# Patient Record
Sex: Male | Born: 1950
Health system: Southern US, Community
[De-identification: ages and names within clinical notes are randomized; demographics above are authoritative.]

## PROBLEM LIST (undated history)

## (undated) ENCOUNTER — Emergency Department (HOSPITAL_COMMUNITY): Admission: EM | Payer: PPO | Source: Home / Self Care

## (undated) DIAGNOSIS — D649 Anemia, unspecified: Secondary | ICD-10-CM

## (undated) DIAGNOSIS — N189 Chronic kidney disease, unspecified: Secondary | ICD-10-CM

## (undated) DIAGNOSIS — E785 Hyperlipidemia, unspecified: Secondary | ICD-10-CM

## (undated) DIAGNOSIS — F0280 Dementia in other diseases classified elsewhere without behavioral disturbance: Secondary | ICD-10-CM

## (undated) DIAGNOSIS — K219 Gastro-esophageal reflux disease without esophagitis: Secondary | ICD-10-CM

## (undated) DIAGNOSIS — I639 Cerebral infarction, unspecified: Secondary | ICD-10-CM

## (undated) DIAGNOSIS — G3109 Other frontotemporal dementia: Secondary | ICD-10-CM

## (undated) DIAGNOSIS — F32A Depression, unspecified: Secondary | ICD-10-CM

## (undated) DIAGNOSIS — F329 Major depressive disorder, single episode, unspecified: Secondary | ICD-10-CM

## (undated) DIAGNOSIS — K635 Polyp of colon: Secondary | ICD-10-CM

## (undated) DIAGNOSIS — I10 Essential (primary) hypertension: Secondary | ICD-10-CM

## (undated) HISTORY — PX: POLYPECTOMY: SHX149

## (undated) HISTORY — DX: Dementia in other diseases classified elsewhere without behavioral disturbance: F02.80

## (undated) HISTORY — DX: Essential (primary) hypertension: I10

## (undated) HISTORY — DX: Depression, unspecified: F32.A

## (undated) HISTORY — DX: Other frontotemporal dementia: G31.09

## (undated) HISTORY — DX: Chronic kidney disease, unspecified: N18.9

## (undated) HISTORY — DX: Polyp of colon: K63.5

## (undated) HISTORY — DX: Hyperlipidemia, unspecified: E78.5

## (undated) HISTORY — DX: Anemia, unspecified: D64.9

## (undated) HISTORY — DX: Cerebral infarction, unspecified: I63.9

## (undated) HISTORY — DX: Major depressive disorder, single episode, unspecified: F32.9

## (undated) HISTORY — PX: COLONOSCOPY: SHX174

---

## 2005-04-27 ENCOUNTER — Inpatient Hospital Stay (HOSPITAL_COMMUNITY): Admission: EM | Admit: 2005-04-27 | Discharge: 2005-05-03 | Payer: Self-pay | Admitting: Pediatrics

## 2005-04-29 ENCOUNTER — Encounter: Payer: Self-pay | Admitting: Neurology

## 2005-05-03 ENCOUNTER — Inpatient Hospital Stay (HOSPITAL_COMMUNITY)
Admission: RE | Admit: 2005-05-03 | Discharge: 2005-05-24 | Payer: Self-pay | Admitting: Physical Medicine & Rehabilitation

## 2005-05-03 ENCOUNTER — Ambulatory Visit: Payer: Self-pay | Admitting: Physical Medicine & Rehabilitation

## 2005-05-27 ENCOUNTER — Ambulatory Visit: Payer: Self-pay | Admitting: Family Medicine

## 2005-05-27 ENCOUNTER — Ambulatory Visit: Payer: Self-pay | Admitting: *Deleted

## 2005-05-28 ENCOUNTER — Encounter
Admission: RE | Admit: 2005-05-28 | Discharge: 2005-07-03 | Payer: Self-pay | Admitting: Physical Medicine & Rehabilitation

## 2005-06-11 ENCOUNTER — Emergency Department (HOSPITAL_COMMUNITY): Admission: EM | Admit: 2005-06-11 | Discharge: 2005-06-11 | Payer: Self-pay | Admitting: Family Medicine

## 2005-06-26 ENCOUNTER — Emergency Department (HOSPITAL_COMMUNITY): Admission: EM | Admit: 2005-06-26 | Discharge: 2005-06-26 | Payer: Self-pay | Admitting: Family Medicine

## 2005-06-30 ENCOUNTER — Inpatient Hospital Stay (HOSPITAL_COMMUNITY): Admission: EM | Admit: 2005-06-30 | Discharge: 2005-07-04 | Payer: Self-pay | Admitting: Emergency Medicine

## 2005-06-30 ENCOUNTER — Ambulatory Visit: Payer: Self-pay | Admitting: Internal Medicine

## 2005-07-01 ENCOUNTER — Encounter (INDEPENDENT_AMBULATORY_CARE_PROVIDER_SITE_OTHER): Payer: Self-pay | Admitting: Interventional Cardiology

## 2005-07-04 ENCOUNTER — Encounter
Admission: RE | Admit: 2005-07-04 | Discharge: 2005-09-25 | Payer: Self-pay | Admitting: Physical Medicine & Rehabilitation

## 2005-07-15 ENCOUNTER — Ambulatory Visit: Payer: Self-pay | Admitting: Family Medicine

## 2005-07-23 ENCOUNTER — Encounter
Admission: RE | Admit: 2005-07-23 | Discharge: 2005-10-21 | Payer: Self-pay | Admitting: Physical Medicine & Rehabilitation

## 2005-07-23 ENCOUNTER — Ambulatory Visit: Payer: Self-pay | Admitting: Physical Medicine & Rehabilitation

## 2005-07-31 ENCOUNTER — Ambulatory Visit: Payer: Self-pay | Admitting: Family Medicine

## 2005-09-02 ENCOUNTER — Ambulatory Visit: Payer: Self-pay | Admitting: Internal Medicine

## 2005-09-18 ENCOUNTER — Emergency Department (HOSPITAL_COMMUNITY): Admission: EM | Admit: 2005-09-18 | Discharge: 2005-09-18 | Payer: Self-pay | Admitting: Emergency Medicine

## 2005-10-06 ENCOUNTER — Inpatient Hospital Stay (HOSPITAL_COMMUNITY): Admission: EM | Admit: 2005-10-06 | Discharge: 2005-10-08 | Payer: Self-pay | Admitting: Family Medicine

## 2005-10-07 ENCOUNTER — Encounter: Payer: Self-pay | Admitting: Vascular Surgery

## 2005-10-07 ENCOUNTER — Encounter (INDEPENDENT_AMBULATORY_CARE_PROVIDER_SITE_OTHER): Payer: Self-pay | Admitting: Interventional Cardiology

## 2005-10-14 ENCOUNTER — Encounter
Admission: RE | Admit: 2005-10-14 | Discharge: 2006-01-12 | Payer: Self-pay | Admitting: Physical Medicine & Rehabilitation

## 2005-10-15 ENCOUNTER — Ambulatory Visit: Payer: Self-pay | Admitting: Physical Medicine & Rehabilitation

## 2005-10-17 ENCOUNTER — Ambulatory Visit: Payer: Self-pay | Admitting: Nurse Practitioner

## 2005-10-23 ENCOUNTER — Ambulatory Visit: Payer: Self-pay | Admitting: Family Medicine

## 2005-11-11 ENCOUNTER — Ambulatory Visit: Payer: Self-pay | Admitting: Physical Medicine & Rehabilitation

## 2005-11-21 ENCOUNTER — Encounter
Admission: RE | Admit: 2005-11-21 | Discharge: 2006-02-19 | Payer: Self-pay | Admitting: Physical Medicine & Rehabilitation

## 2005-12-02 ENCOUNTER — Encounter
Admission: RE | Admit: 2005-12-02 | Discharge: 2006-03-02 | Payer: Self-pay | Admitting: Physical Medicine & Rehabilitation

## 2005-12-02 ENCOUNTER — Encounter (INDEPENDENT_AMBULATORY_CARE_PROVIDER_SITE_OTHER): Payer: Self-pay | Admitting: Internal Medicine

## 2005-12-03 ENCOUNTER — Ambulatory Visit: Payer: Self-pay | Admitting: Internal Medicine

## 2006-01-01 ENCOUNTER — Ambulatory Visit: Payer: Self-pay | Admitting: Physical Medicine & Rehabilitation

## 2006-01-28 ENCOUNTER — Ambulatory Visit: Payer: Self-pay | Admitting: Internal Medicine

## 2006-03-03 ENCOUNTER — Ambulatory Visit: Payer: Self-pay | Admitting: Internal Medicine

## 2006-03-20 ENCOUNTER — Ambulatory Visit: Payer: Self-pay | Admitting: Internal Medicine

## 2006-04-24 ENCOUNTER — Ambulatory Visit: Payer: Self-pay | Admitting: Internal Medicine

## 2006-04-24 ENCOUNTER — Encounter (INDEPENDENT_AMBULATORY_CARE_PROVIDER_SITE_OTHER): Payer: Self-pay | Admitting: Internal Medicine

## 2006-04-24 DIAGNOSIS — I10 Essential (primary) hypertension: Secondary | ICD-10-CM

## 2006-04-24 DIAGNOSIS — E1169 Type 2 diabetes mellitus with other specified complication: Secondary | ICD-10-CM

## 2006-04-24 DIAGNOSIS — E785 Hyperlipidemia, unspecified: Secondary | ICD-10-CM

## 2006-04-24 HISTORY — DX: Hyperlipidemia, unspecified: E78.5

## 2006-04-24 LAB — CONVERTED CEMR LAB
Albumin: 4.5 g/dL (ref 3.5–5.2)
CO2: 22 meq/L (ref 19–32)
Glucose, Bld: 108 mg/dL — ABNORMAL HIGH (ref 70–99)
Potassium: 4.7 meq/L (ref 3.5–5.3)
RBC: 5 M/uL (ref 4.22–5.81)
Sodium: 142 meq/L (ref 135–145)
Total Protein: 7.9 g/dL (ref 6.0–8.3)
WBC: 5.7 10*3/uL (ref 4.0–10.5)

## 2006-04-25 ENCOUNTER — Encounter (INDEPENDENT_AMBULATORY_CARE_PROVIDER_SITE_OTHER): Payer: Self-pay | Admitting: Internal Medicine

## 2006-04-25 DIAGNOSIS — N189 Chronic kidney disease, unspecified: Secondary | ICD-10-CM

## 2006-04-25 DIAGNOSIS — N182 Chronic kidney disease, stage 2 (mild): Secondary | ICD-10-CM

## 2006-04-25 HISTORY — DX: Chronic kidney disease, unspecified: N18.9

## 2006-04-28 ENCOUNTER — Telehealth: Payer: Self-pay | Admitting: *Deleted

## 2006-05-01 ENCOUNTER — Encounter
Admission: RE | Admit: 2006-05-01 | Discharge: 2006-07-30 | Payer: Self-pay | Admitting: Physical Medicine & Rehabilitation

## 2006-05-01 ENCOUNTER — Ambulatory Visit: Payer: Self-pay | Admitting: Physical Medicine & Rehabilitation

## 2006-05-08 ENCOUNTER — Telehealth (INDEPENDENT_AMBULATORY_CARE_PROVIDER_SITE_OTHER): Payer: Self-pay | Admitting: Internal Medicine

## 2006-05-23 ENCOUNTER — Ambulatory Visit: Payer: Self-pay | Admitting: Internal Medicine

## 2006-05-23 ENCOUNTER — Ambulatory Visit: Payer: Self-pay | Admitting: *Deleted

## 2006-05-23 ENCOUNTER — Telehealth: Payer: Self-pay | Admitting: *Deleted

## 2006-05-23 ENCOUNTER — Inpatient Hospital Stay (HOSPITAL_COMMUNITY): Admission: AD | Admit: 2006-05-23 | Discharge: 2006-05-26 | Payer: Self-pay | Admitting: Internal Medicine

## 2006-05-23 LAB — CONVERTED CEMR LAB
Potassium: 4.2 meq/L (ref 3.5–5.3)
Sodium: 142 meq/L (ref 135–145)

## 2006-05-26 ENCOUNTER — Encounter (INDEPENDENT_AMBULATORY_CARE_PROVIDER_SITE_OTHER): Payer: Self-pay | Admitting: Internal Medicine

## 2006-05-27 ENCOUNTER — Telehealth (INDEPENDENT_AMBULATORY_CARE_PROVIDER_SITE_OTHER): Payer: Self-pay | Admitting: *Deleted

## 2006-06-02 ENCOUNTER — Telehealth (INDEPENDENT_AMBULATORY_CARE_PROVIDER_SITE_OTHER): Payer: Self-pay | Admitting: *Deleted

## 2006-06-03 ENCOUNTER — Telehealth: Payer: Self-pay | Admitting: *Deleted

## 2006-06-04 ENCOUNTER — Ambulatory Visit: Payer: Self-pay | Admitting: Hospitalist

## 2006-06-04 LAB — CONVERTED CEMR LAB: Blood Glucose, Fingerstick: 150

## 2006-06-05 ENCOUNTER — Encounter (INDEPENDENT_AMBULATORY_CARE_PROVIDER_SITE_OTHER): Payer: Self-pay | Admitting: Pulmonary Disease

## 2006-06-05 ENCOUNTER — Telehealth: Payer: Self-pay | Admitting: *Deleted

## 2006-06-05 LAB — CONVERTED CEMR LAB
BUN: 27 mg/dL — ABNORMAL HIGH (ref 6–23)
CO2: 23 meq/L (ref 19–32)
Chloride: 106 meq/L (ref 96–112)
Creatinine, Ser: 1.61 mg/dL — ABNORMAL HIGH (ref 0.40–1.50)
Glucose, Bld: 85 mg/dL (ref 70–99)
Sodium: 142 meq/L (ref 135–145)

## 2006-06-10 ENCOUNTER — Ambulatory Visit: Payer: Self-pay | Admitting: Internal Medicine

## 2006-06-10 ENCOUNTER — Encounter (INDEPENDENT_AMBULATORY_CARE_PROVIDER_SITE_OTHER): Payer: Self-pay | Admitting: Pulmonary Disease

## 2006-06-11 LAB — CONVERTED CEMR LAB
Calcium: 9.5 mg/dL (ref 8.4–10.5)
Glucose, Bld: 99 mg/dL (ref 70–99)
Potassium: 4.5 meq/L (ref 3.5–5.3)
Sodium: 144 meq/L (ref 135–145)

## 2006-06-12 ENCOUNTER — Telehealth (INDEPENDENT_AMBULATORY_CARE_PROVIDER_SITE_OTHER): Payer: Self-pay | Admitting: *Deleted

## 2006-06-16 ENCOUNTER — Ambulatory Visit: Payer: Self-pay | Admitting: Internal Medicine

## 2006-06-16 ENCOUNTER — Encounter (INDEPENDENT_AMBULATORY_CARE_PROVIDER_SITE_OTHER): Payer: Self-pay | Admitting: Dermatology

## 2006-06-16 LAB — CONVERTED CEMR LAB
BUN: 25 mg/dL — ABNORMAL HIGH (ref 6–23)
Blood Glucose, Fingerstick: 104
Creatinine, Ser: 1.5 mg/dL (ref 0.40–1.50)
Hgb A1c MFr Bld: 5.9 %
Potassium: 4 meq/L (ref 3.5–5.3)

## 2006-06-17 ENCOUNTER — Encounter (INDEPENDENT_AMBULATORY_CARE_PROVIDER_SITE_OTHER): Payer: Self-pay | Admitting: *Deleted

## 2006-07-10 ENCOUNTER — Telehealth: Payer: Self-pay | Admitting: *Deleted

## 2006-07-22 ENCOUNTER — Telehealth: Payer: Self-pay | Admitting: *Deleted

## 2006-08-04 ENCOUNTER — Ambulatory Visit: Payer: Self-pay | Admitting: Internal Medicine

## 2006-08-04 ENCOUNTER — Telehealth: Payer: Self-pay | Admitting: *Deleted

## 2006-08-05 ENCOUNTER — Telehealth: Payer: Self-pay | Admitting: *Deleted

## 2006-08-06 ENCOUNTER — Telehealth: Payer: Self-pay | Admitting: *Deleted

## 2006-08-10 ENCOUNTER — Emergency Department (HOSPITAL_COMMUNITY): Admission: EM | Admit: 2006-08-10 | Discharge: 2006-08-10 | Payer: Self-pay | Admitting: Family Medicine

## 2006-08-11 ENCOUNTER — Telehealth (INDEPENDENT_AMBULATORY_CARE_PROVIDER_SITE_OTHER): Payer: Self-pay | Admitting: *Deleted

## 2006-08-21 ENCOUNTER — Ambulatory Visit: Payer: Self-pay | Admitting: Internal Medicine

## 2006-08-21 ENCOUNTER — Ambulatory Visit (HOSPITAL_COMMUNITY): Admission: RE | Admit: 2006-08-21 | Discharge: 2006-08-21 | Payer: Self-pay | Admitting: Internal Medicine

## 2006-08-21 LAB — CONVERTED CEMR LAB: Blood Glucose, Fingerstick: 188

## 2006-08-22 ENCOUNTER — Telehealth: Payer: Self-pay | Admitting: *Deleted

## 2006-08-22 ENCOUNTER — Ambulatory Visit: Payer: Self-pay | Admitting: Physical Medicine & Rehabilitation

## 2006-08-22 ENCOUNTER — Encounter
Admission: RE | Admit: 2006-08-22 | Discharge: 2006-10-14 | Payer: Self-pay | Admitting: Physical Medicine & Rehabilitation

## 2006-09-18 ENCOUNTER — Ambulatory Visit: Payer: Self-pay | Admitting: Hospitalist

## 2006-09-18 ENCOUNTER — Encounter (INDEPENDENT_AMBULATORY_CARE_PROVIDER_SITE_OTHER): Payer: Self-pay | Admitting: Internal Medicine

## 2006-09-18 LAB — CONVERTED CEMR LAB
Blood Glucose, Fingerstick: 114
Hgb A1c MFr Bld: 6.3 %

## 2006-09-19 LAB — CONVERTED CEMR LAB
ALT: 22 units/L (ref 0–53)
AST: 20 units/L (ref 0–37)
Alkaline Phosphatase: 63 units/L (ref 39–117)
BUN: 23 mg/dL (ref 6–23)
Creatinine, Ser: 1.71 mg/dL — ABNORMAL HIGH (ref 0.40–1.50)
Potassium: 4.2 meq/L (ref 3.5–5.3)

## 2006-09-24 ENCOUNTER — Telehealth: Payer: Self-pay | Admitting: *Deleted

## 2006-10-01 ENCOUNTER — Encounter (INDEPENDENT_AMBULATORY_CARE_PROVIDER_SITE_OTHER): Payer: Self-pay | Admitting: *Deleted

## 2006-10-01 ENCOUNTER — Telehealth: Payer: Self-pay | Admitting: Internal Medicine

## 2006-10-02 ENCOUNTER — Telehealth: Payer: Self-pay | Admitting: Internal Medicine

## 2006-10-08 ENCOUNTER — Telehealth: Payer: Self-pay | Admitting: Internal Medicine

## 2006-10-09 ENCOUNTER — Emergency Department (HOSPITAL_COMMUNITY): Admission: EM | Admit: 2006-10-09 | Discharge: 2006-10-09 | Payer: Self-pay | Admitting: Emergency Medicine

## 2006-10-20 ENCOUNTER — Telehealth: Payer: Self-pay | Admitting: *Deleted

## 2006-11-12 ENCOUNTER — Telehealth (INDEPENDENT_AMBULATORY_CARE_PROVIDER_SITE_OTHER): Payer: Self-pay | Admitting: Pharmacy Technician

## 2006-11-13 ENCOUNTER — Telehealth (INDEPENDENT_AMBULATORY_CARE_PROVIDER_SITE_OTHER): Payer: Self-pay | Admitting: *Deleted

## 2006-11-14 ENCOUNTER — Telehealth (INDEPENDENT_AMBULATORY_CARE_PROVIDER_SITE_OTHER): Payer: Self-pay | Admitting: *Deleted

## 2006-11-19 ENCOUNTER — Telehealth (INDEPENDENT_AMBULATORY_CARE_PROVIDER_SITE_OTHER): Payer: Self-pay | Admitting: Pharmacy Technician

## 2006-12-04 ENCOUNTER — Ambulatory Visit: Payer: Self-pay | Admitting: Internal Medicine

## 2006-12-04 ENCOUNTER — Encounter (INDEPENDENT_AMBULATORY_CARE_PROVIDER_SITE_OTHER): Payer: Self-pay | Admitting: *Deleted

## 2006-12-04 LAB — CONVERTED CEMR LAB
CO2: 30 meq/L (ref 19–32)
Glucose, Bld: 85 mg/dL (ref 70–99)
Potassium: 4.3 meq/L (ref 3.5–5.3)
Sodium: 144 meq/L (ref 135–145)

## 2006-12-17 ENCOUNTER — Ambulatory Visit: Payer: Self-pay | Admitting: Infectious Diseases

## 2006-12-17 LAB — CONVERTED CEMR LAB: Blood Glucose, Fingerstick: 98

## 2006-12-26 ENCOUNTER — Telehealth (INDEPENDENT_AMBULATORY_CARE_PROVIDER_SITE_OTHER): Payer: Self-pay | Admitting: *Deleted

## 2007-01-06 ENCOUNTER — Telehealth: Payer: Self-pay | Admitting: *Deleted

## 2007-01-26 ENCOUNTER — Encounter (INDEPENDENT_AMBULATORY_CARE_PROVIDER_SITE_OTHER): Payer: Self-pay | Admitting: Internal Medicine

## 2007-01-26 ENCOUNTER — Ambulatory Visit: Payer: Self-pay | Admitting: Infectious Disease

## 2007-01-26 DIAGNOSIS — F329 Major depressive disorder, single episode, unspecified: Secondary | ICD-10-CM

## 2007-01-26 LAB — CONVERTED CEMR LAB: Blood Glucose, Fingerstick: 115

## 2007-02-03 ENCOUNTER — Encounter (INDEPENDENT_AMBULATORY_CARE_PROVIDER_SITE_OTHER): Payer: Self-pay | Admitting: Internal Medicine

## 2007-02-03 ENCOUNTER — Ambulatory Visit: Payer: Self-pay | Admitting: Infectious Disease

## 2007-02-04 ENCOUNTER — Ambulatory Visit: Payer: Self-pay | Admitting: Internal Medicine

## 2007-02-04 ENCOUNTER — Encounter (INDEPENDENT_AMBULATORY_CARE_PROVIDER_SITE_OTHER): Payer: Self-pay | Admitting: Internal Medicine

## 2007-02-04 LAB — CONVERTED CEMR LAB
Basophils Absolute: 0 10*3/uL (ref 0.0–0.1)
CO2: 24 meq/L (ref 19–32)
Chloride: 104 meq/L (ref 96–112)
Creatinine, Ser: 1.59 mg/dL — ABNORMAL HIGH (ref 0.40–1.50)
Glucose, Bld: 98 mg/dL (ref 70–99)
HDL: 36 mg/dL — ABNORMAL LOW (ref >39)
Hemoglobin: 12.2 g/dL — ABNORMAL LOW (ref 13.0–17.0)
LDL Cholesterol: 73 mg/dL (ref 0–99)
Lymphocytes Relative: 47 % — ABNORMAL HIGH (ref 12–46)
Lymphs Abs: 2.4 10*3/uL (ref 0.7–4.0)
Monocytes Absolute: 0.5 10*3/uL (ref 0.1–1.0)
Monocytes Relative: 10 % (ref 3–12)
Neutro Abs: 2.1 10*3/uL (ref 1.7–7.7)
RBC: 4.73 M/uL (ref 4.22–5.81)
RDW: 15.3 % (ref 11.5–15.5)
Testosterone: 239.7 ng/dL — ABNORMAL LOW (ref 350–890)
WBC: 5.2 10*3/uL (ref 4.0–10.5)

## 2007-02-05 ENCOUNTER — Encounter: Payer: Self-pay | Admitting: Licensed Clinical Social Worker

## 2007-02-12 ENCOUNTER — Ambulatory Visit: Payer: Self-pay

## 2007-02-12 DIAGNOSIS — J069 Acute upper respiratory infection, unspecified: Secondary | ICD-10-CM | POA: Insufficient documentation

## 2007-02-16 ENCOUNTER — Encounter: Admission: RE | Admit: 2007-02-16 | Discharge: 2007-03-13 | Payer: Self-pay | Admitting: Internal Medicine

## 2007-02-19 ENCOUNTER — Encounter
Admission: RE | Admit: 2007-02-19 | Discharge: 2007-02-20 | Payer: Self-pay | Admitting: Physical Medicine & Rehabilitation

## 2007-02-19 ENCOUNTER — Encounter (INDEPENDENT_AMBULATORY_CARE_PROVIDER_SITE_OTHER): Payer: Self-pay | Admitting: Internal Medicine

## 2007-02-19 ENCOUNTER — Ambulatory Visit: Payer: Self-pay | Admitting: Physical Medicine & Rehabilitation

## 2007-03-11 ENCOUNTER — Encounter (INDEPENDENT_AMBULATORY_CARE_PROVIDER_SITE_OTHER): Payer: Self-pay | Admitting: Internal Medicine

## 2007-03-11 ENCOUNTER — Ambulatory Visit: Payer: Self-pay | Admitting: Internal Medicine

## 2007-03-12 ENCOUNTER — Encounter (INDEPENDENT_AMBULATORY_CARE_PROVIDER_SITE_OTHER): Payer: Self-pay | Admitting: Internal Medicine

## 2007-03-12 LAB — CONVERTED CEMR LAB
BUN: 17 mg/dL (ref 6–23)
Calcium: 9.8 mg/dL (ref 8.4–10.5)
Glucose, Bld: 92 mg/dL (ref 70–99)
Sodium: 145 meq/L (ref 135–145)

## 2007-04-07 ENCOUNTER — Telehealth: Payer: Self-pay | Admitting: Internal Medicine

## 2007-05-21 ENCOUNTER — Ambulatory Visit (HOSPITAL_COMMUNITY): Admission: RE | Admit: 2007-05-21 | Discharge: 2007-05-21 | Payer: Self-pay | Admitting: Podiatry

## 2007-05-21 ENCOUNTER — Encounter (INDEPENDENT_AMBULATORY_CARE_PROVIDER_SITE_OTHER): Payer: Self-pay | Admitting: Podiatry

## 2007-05-21 ENCOUNTER — Ambulatory Visit: Payer: Self-pay | Admitting: *Deleted

## 2007-06-11 ENCOUNTER — Telehealth (INDEPENDENT_AMBULATORY_CARE_PROVIDER_SITE_OTHER): Payer: Self-pay | Admitting: Internal Medicine

## 2007-06-24 ENCOUNTER — Telehealth (INDEPENDENT_AMBULATORY_CARE_PROVIDER_SITE_OTHER): Payer: Self-pay | Admitting: Internal Medicine

## 2007-08-07 ENCOUNTER — Ambulatory Visit: Payer: Self-pay | Admitting: Internal Medicine

## 2007-08-07 LAB — CONVERTED CEMR LAB: Blood Glucose, Fingerstick: 111

## 2007-08-12 ENCOUNTER — Telehealth (INDEPENDENT_AMBULATORY_CARE_PROVIDER_SITE_OTHER): Payer: Self-pay | Admitting: Internal Medicine

## 2007-08-18 ENCOUNTER — Telehealth: Payer: Self-pay | Admitting: *Deleted

## 2007-08-18 ENCOUNTER — Telehealth: Payer: Self-pay | Admitting: Internal Medicine

## 2007-09-09 ENCOUNTER — Ambulatory Visit: Payer: Self-pay | Admitting: Internal Medicine

## 2007-09-09 DIAGNOSIS — R079 Chest pain, unspecified: Secondary | ICD-10-CM

## 2007-09-10 ENCOUNTER — Telehealth (INDEPENDENT_AMBULATORY_CARE_PROVIDER_SITE_OTHER): Payer: Self-pay | Admitting: Internal Medicine

## 2007-09-24 ENCOUNTER — Telehealth: Payer: Self-pay | Admitting: *Deleted

## 2007-10-01 ENCOUNTER — Telehealth (INDEPENDENT_AMBULATORY_CARE_PROVIDER_SITE_OTHER): Payer: Self-pay | Admitting: Internal Medicine

## 2007-10-02 ENCOUNTER — Telehealth (INDEPENDENT_AMBULATORY_CARE_PROVIDER_SITE_OTHER): Payer: Self-pay | Admitting: Internal Medicine

## 2007-10-05 ENCOUNTER — Telehealth (INDEPENDENT_AMBULATORY_CARE_PROVIDER_SITE_OTHER): Payer: Self-pay | Admitting: Internal Medicine

## 2007-11-05 ENCOUNTER — Telehealth (INDEPENDENT_AMBULATORY_CARE_PROVIDER_SITE_OTHER): Payer: Self-pay | Admitting: Internal Medicine

## 2007-11-10 ENCOUNTER — Emergency Department (HOSPITAL_COMMUNITY): Admission: EM | Admit: 2007-11-10 | Discharge: 2007-11-10 | Payer: Self-pay | Admitting: Family Medicine

## 2007-11-17 ENCOUNTER — Encounter (INDEPENDENT_AMBULATORY_CARE_PROVIDER_SITE_OTHER): Payer: Self-pay | Admitting: Internal Medicine

## 2007-11-17 ENCOUNTER — Ambulatory Visit: Payer: Self-pay | Admitting: Internal Medicine

## 2007-11-17 DIAGNOSIS — D649 Anemia, unspecified: Secondary | ICD-10-CM

## 2007-11-17 HISTORY — DX: Anemia, unspecified: D64.9

## 2007-11-17 LAB — CONVERTED CEMR LAB
AST: 47 units/L — ABNORMAL HIGH (ref 0–37)
Alkaline Phosphatase: 74 units/L (ref 39–117)
BUN: 18 mg/dL (ref 6–23)
Blood Glucose, Fingerstick: 87
Creatinine, Ser: 1.72 mg/dL — ABNORMAL HIGH (ref 0.40–1.50)
HCT: 36.5 % — ABNORMAL LOW (ref 39.0–52.0)
Hemoglobin: 11.7 g/dL — ABNORMAL LOW (ref 13.0–17.0)
MCHC: 32.1 g/dL (ref 30.0–36.0)
RDW: 16.2 % — ABNORMAL HIGH (ref 11.5–15.5)
Total Bilirubin: 0.5 mg/dL (ref 0.3–1.2)

## 2007-11-23 ENCOUNTER — Ambulatory Visit: Payer: Self-pay | Admitting: Gastroenterology

## 2007-12-09 ENCOUNTER — Telehealth (INDEPENDENT_AMBULATORY_CARE_PROVIDER_SITE_OTHER): Payer: Self-pay | Admitting: Internal Medicine

## 2007-12-17 ENCOUNTER — Telehealth: Payer: Self-pay | Admitting: *Deleted

## 2007-12-21 ENCOUNTER — Ambulatory Visit: Payer: Self-pay | Admitting: Gastroenterology

## 2007-12-22 ENCOUNTER — Telehealth: Payer: Self-pay | Admitting: Gastroenterology

## 2008-01-07 ENCOUNTER — Emergency Department (HOSPITAL_COMMUNITY): Admission: EM | Admit: 2008-01-07 | Discharge: 2008-01-07 | Payer: Self-pay | Admitting: Emergency Medicine

## 2008-01-07 ENCOUNTER — Telehealth (INDEPENDENT_AMBULATORY_CARE_PROVIDER_SITE_OTHER): Payer: Self-pay | Admitting: Internal Medicine

## 2008-01-11 ENCOUNTER — Telehealth: Payer: Self-pay | Admitting: Infectious Diseases

## 2008-02-26 ENCOUNTER — Inpatient Hospital Stay (HOSPITAL_COMMUNITY): Admission: EM | Admit: 2008-02-26 | Discharge: 2008-02-29 | Payer: Self-pay | Admitting: Emergency Medicine

## 2008-02-26 ENCOUNTER — Ambulatory Visit: Payer: Self-pay | Admitting: Internal Medicine

## 2008-02-26 ENCOUNTER — Encounter: Payer: Self-pay | Admitting: *Deleted

## 2008-02-26 LAB — CONVERTED CEMR LAB
HDL: 31 mg/dL
LDL Cholesterol: 58 mg/dL

## 2008-02-29 ENCOUNTER — Encounter: Payer: Self-pay | Admitting: Internal Medicine

## 2008-03-02 ENCOUNTER — Encounter: Payer: Self-pay | Admitting: *Deleted

## 2008-03-14 ENCOUNTER — Telehealth: Payer: Self-pay | Admitting: Internal Medicine

## 2008-03-15 ENCOUNTER — Telehealth: Payer: Self-pay | Admitting: Internal Medicine

## 2008-03-22 ENCOUNTER — Telehealth (INDEPENDENT_AMBULATORY_CARE_PROVIDER_SITE_OTHER): Payer: Self-pay | Admitting: Internal Medicine

## 2008-03-25 ENCOUNTER — Encounter (INDEPENDENT_AMBULATORY_CARE_PROVIDER_SITE_OTHER): Payer: Self-pay | Admitting: Internal Medicine

## 2008-03-25 ENCOUNTER — Ambulatory Visit: Payer: Self-pay | Admitting: Internal Medicine

## 2008-03-25 LAB — CONVERTED CEMR LAB
Hemoglobin: 11.4 g/dL — ABNORMAL LOW (ref 13.0–17.0)
RBC: 4.55 M/uL (ref 4.22–5.81)
RDW: 16.6 % — ABNORMAL HIGH (ref 11.5–15.5)

## 2008-04-13 ENCOUNTER — Telehealth (INDEPENDENT_AMBULATORY_CARE_PROVIDER_SITE_OTHER): Payer: Self-pay | Admitting: Internal Medicine

## 2008-08-16 ENCOUNTER — Telehealth: Payer: Self-pay | Admitting: Internal Medicine

## 2008-08-17 ENCOUNTER — Ambulatory Visit: Payer: Self-pay | Admitting: Internal Medicine

## 2008-08-17 LAB — CONVERTED CEMR LAB: Blood Glucose, Fingerstick: 139

## 2008-08-18 ENCOUNTER — Encounter: Payer: Self-pay | Admitting: Internal Medicine

## 2008-08-18 LAB — CONVERTED CEMR LAB
Alkaline Phosphatase: 93 units/L (ref 39–117)
Creatinine, Ser: 1.36 mg/dL (ref 0.40–1.50)
Glucose, Bld: 117 mg/dL — ABNORMAL HIGH (ref 70–99)
Microalb Creat Ratio: 24.3 mg/g (ref 0.0–30.0)
Microalb, Ur: 4.97 mg/dL — ABNORMAL HIGH (ref 0.00–1.89)
Sodium: 145 meq/L (ref 135–145)
Total Bilirubin: 0.2 mg/dL — ABNORMAL LOW (ref 0.3–1.2)
Total Protein: 7 g/dL (ref 6.0–8.3)

## 2008-08-24 ENCOUNTER — Telehealth (INDEPENDENT_AMBULATORY_CARE_PROVIDER_SITE_OTHER): Payer: Self-pay | Admitting: Internal Medicine

## 2008-08-31 ENCOUNTER — Telehealth: Payer: Self-pay | Admitting: Internal Medicine

## 2008-09-01 ENCOUNTER — Telehealth: Payer: Self-pay | Admitting: Internal Medicine

## 2008-09-02 ENCOUNTER — Encounter: Payer: Self-pay | Admitting: Internal Medicine

## 2008-09-05 ENCOUNTER — Telehealth: Payer: Self-pay | Admitting: *Deleted

## 2008-10-26 ENCOUNTER — Telehealth: Payer: Self-pay | Admitting: Internal Medicine

## 2008-10-31 ENCOUNTER — Ambulatory Visit: Payer: Self-pay | Admitting: Internal Medicine

## 2008-11-16 ENCOUNTER — Encounter: Payer: Self-pay | Admitting: Internal Medicine

## 2008-11-17 ENCOUNTER — Encounter (INDEPENDENT_AMBULATORY_CARE_PROVIDER_SITE_OTHER): Payer: Self-pay | Admitting: *Deleted

## 2008-11-17 ENCOUNTER — Ambulatory Visit: Payer: Self-pay | Admitting: Internal Medicine

## 2008-11-17 LAB — CONVERTED CEMR LAB: Blood Glucose, Fingerstick: 134

## 2009-01-05 ENCOUNTER — Telehealth: Payer: Self-pay | Admitting: Internal Medicine

## 2009-01-19 ENCOUNTER — Telehealth: Payer: Self-pay | Admitting: Internal Medicine

## 2009-03-03 ENCOUNTER — Telehealth: Payer: Self-pay | Admitting: *Deleted

## 2009-03-03 ENCOUNTER — Ambulatory Visit: Payer: Self-pay | Admitting: Internal Medicine

## 2009-03-03 DIAGNOSIS — M546 Pain in thoracic spine: Secondary | ICD-10-CM | POA: Insufficient documentation

## 2009-03-03 LAB — CONVERTED CEMR LAB
ALT: 18 units/L (ref 0–53)
AST: 18 units/L (ref 0–37)
Albumin: 4.1 g/dL (ref 3.5–5.2)
Alkaline Phosphatase: 93 units/L (ref 39–117)
BUN: 14 mg/dL (ref 6–23)
Blood Glucose, Fingerstick: 140
CO2: 24 meq/L (ref 19–32)
Calcium: 9.2 mg/dL (ref 8.4–10.5)
Chloride: 107 meq/L (ref 96–112)
Cholesterol: 137 mg/dL (ref 0–200)
Creatinine, Ser: 1.38 mg/dL (ref 0.40–1.50)
Glucose, Bld: 109 mg/dL — ABNORMAL HIGH (ref 70–99)
HDL: 41 mg/dL (ref >39)
Hgb A1c MFr Bld: 7 %
LDL Cholesterol: 66 mg/dL (ref 0–99)
Potassium: 4.5 meq/L (ref 3.5–5.3)
Sodium: 144 meq/L (ref 135–145)
Total Bilirubin: 0.2 mg/dL — ABNORMAL LOW (ref 0.3–1.2)
Total CHOL/HDL Ratio: 3.3
Total Protein: 7 g/dL (ref 6.0–8.3)
Triglycerides: 150 mg/dL — ABNORMAL HIGH (ref <150)
VLDL: 30 mg/dL (ref 0–40)

## 2009-03-16 ENCOUNTER — Telehealth: Payer: Self-pay | Admitting: Internal Medicine

## 2009-05-01 ENCOUNTER — Telehealth: Payer: Self-pay | Admitting: Internal Medicine

## 2009-05-02 ENCOUNTER — Encounter: Payer: Self-pay | Admitting: Internal Medicine

## 2009-05-19 ENCOUNTER — Telehealth: Payer: Self-pay | Admitting: *Deleted

## 2009-05-22 ENCOUNTER — Telehealth: Payer: Self-pay | Admitting: Internal Medicine

## 2009-06-13 ENCOUNTER — Telehealth: Payer: Self-pay | Admitting: Internal Medicine

## 2009-06-29 ENCOUNTER — Telehealth: Payer: Self-pay | Admitting: Internal Medicine

## 2009-08-02 ENCOUNTER — Ambulatory Visit: Payer: Self-pay | Admitting: Internal Medicine

## 2009-08-02 LAB — CONVERTED CEMR LAB
Albumin: 4.3 g/dL (ref 3.5–5.2)
BUN: 15 mg/dL (ref 6–23)
Blood Glucose, AC Bkfst: 100 mg/dL
Calcium: 9.5 mg/dL (ref 8.4–10.5)
Creatinine, Ser: 1.26 mg/dL (ref 0.40–1.50)
Hgb A1c MFr Bld: 6.5 %
Phosphorus: 3.1 mg/dL (ref 2.3–4.6)

## 2009-08-02 LAB — HM DIABETES FOOT EXAM

## 2009-08-10 ENCOUNTER — Telehealth: Payer: Self-pay | Admitting: *Deleted

## 2009-08-18 ENCOUNTER — Telehealth: Payer: Self-pay | Admitting: Internal Medicine

## 2009-09-04 ENCOUNTER — Telehealth: Payer: Self-pay | Admitting: Internal Medicine

## 2009-09-06 ENCOUNTER — Telehealth: Payer: Self-pay | Admitting: Internal Medicine

## 2009-09-23 ENCOUNTER — Telehealth: Payer: Self-pay | Admitting: Internal Medicine

## 2009-09-26 ENCOUNTER — Telehealth: Payer: Self-pay | Admitting: Internal Medicine

## 2009-10-25 ENCOUNTER — Telehealth: Payer: Self-pay | Admitting: Internal Medicine

## 2009-10-27 ENCOUNTER — Telehealth: Payer: Self-pay | Admitting: Internal Medicine

## 2009-11-28 ENCOUNTER — Telehealth (INDEPENDENT_AMBULATORY_CARE_PROVIDER_SITE_OTHER): Payer: Self-pay | Admitting: *Deleted

## 2009-12-04 ENCOUNTER — Ambulatory Visit: Payer: Self-pay | Admitting: Internal Medicine

## 2009-12-04 ENCOUNTER — Telehealth: Payer: Self-pay | Admitting: Ophthalmology

## 2009-12-04 DIAGNOSIS — G4700 Insomnia, unspecified: Secondary | ICD-10-CM

## 2009-12-04 LAB — CONVERTED CEMR LAB
BUN: 15 mg/dL (ref 6–23)
Creatinine, Ser: 1.35 mg/dL (ref 0.40–1.50)
Glucose, Bld: 95 mg/dL (ref 70–99)
Hgb A1c MFr Bld: 6.2 %

## 2009-12-18 ENCOUNTER — Telehealth: Payer: Self-pay | Admitting: Internal Medicine

## 2010-01-01 ENCOUNTER — Telehealth: Payer: Self-pay | Admitting: *Deleted

## 2010-01-03 ENCOUNTER — Telehealth: Payer: Self-pay | Admitting: *Deleted

## 2010-02-04 ENCOUNTER — Encounter: Payer: Self-pay | Admitting: Physical Medicine & Rehabilitation

## 2010-02-06 ENCOUNTER — Telehealth: Payer: Self-pay | Admitting: *Deleted

## 2010-02-13 NOTE — Progress Notes (Signed)
Summary: medications/gp  Phone Note Call from Patient   Caller: patient's wife Summary of Call: Pt.'s wife called about Rxs. were not called to [facility] pharmacy.  Flexeril and Metformin were called to [name].  Ultracet is not available; Dr. [name] will change it to Ultram; message left at [facility] pharmacy.  Dr. [name] awared Flector not available at [facility] pharmacybut [facility]. Initial call taken by: Chinita Pester RN,  March 03, 2009 5:01 PM  Follow-up for Phone Call        Pt. wife was called back about change in med.  Also instructed she can [facility]'s [facility]. Follow-up by: Chinita Pester RN,  March 03, 2009 5:04 PM

## 2010-02-13 NOTE — Assessment & Plan Note (Signed)
Summary: 1 MONTH CHECK UP/CFB   Vital Signs:  Patient profile:   60 year old male Height:      68 inches (172.72 cm) Weight:      245.8 pounds (111.73 kg) BMI:     37.51 Temp:     98.2 degrees F (36.78 degrees C) oral Pulse rate:   106 / minute BP sitting:   122 / 79  (left arm) Cuff size:   large  Vitals Entered By: Cynda Familia Duncan Dull) (August 02, 2009 10:14 AM) Is Patient Diabetic? Yes Did you bring your meter with you today? No Pain Assessment Patient in pain? no      Nutritional Status BMI of > 30 = obese  Have you ever been in a relationship where you felt threatened, hurt or afraid?No   Does patient need assistance? Functional Status Self care Ambulation Impaired:Risk for fall Comments uses a cane   Diabetic Foot Exam Foot Inspection Is there a history of a foot ulcer?              No Is there a foot ulcer now?              No Can the patient see the bottom of their feet?          No Are the shoes appropriate in style and fit?          Yes Are the toenails long?                No Are the toenails thick?                Yes Are the toenails ingrown?              No Is there heavy callous build-up?              No  Diabetic Foot Care Education Patient educated on appropriate care of diabetic feet.   High Risk Feet? Yes   10-g (5.07) Semmes-Weinstein Monofilament Test Performed by: Lynn Ito          Right Foot          Left Foot Site 1         normal         abnormal Site 2         abnormal         abnormal Site 3         abnormal         abnormal Site 4         abnormal         abnormal Site 5         abnormal         abnormal Site 6         abnormal         abnormal  Impression      abnormal         abnormal   Primary Care Provider:  Carlus Pavlov MD   History of Present Illness: Mr Dunlap is a 60 yo man with PMH as oultined in chart.  He is here for routine follow up, states he is not sure why he is here.  Reviewing notes, there was some  concern with renal function and use of metformin.  I had mentioned in the past that this may require change to glipizide.  There was a request while I was out of the office that was filled by another physician and  my prior notes were referenced.  Otherwise he is doing well without any complaints today.    Of note, he did not bring his medications in today.  His wife sets up his medications for him to take and she is not here with him today.  His son brought him today, he did not drive.   All systems reviewed and negative.    Depression History:      The patient denies a depressed mood most of the day and a diminished interest in his usual daily activities.         Preventive Screening-Counseling & Management  Alcohol-Tobacco     Smoking Status: quit     Year Quit: 2007     Pack years: 600+  Allergies: 1)  ! Depakote (Divalproex Sodium)  Past History:  Past Medical History: Last updated: 11/17/2007 s/p 3 strokes (04, 06 and 07/2005) did not see a doctor before the strokes Diabetes mellitus, type II Hypertension Weakness in his legs and arms, but worse in his legs Depression  Family History: Last updated: 12/21/2007 Family History of Diabetes: mom grandmother No FH of Colon Cancer:  Social History: Last updated: 08/17/2008 Married. His wife cares for him and she works for [employer]. Used to be a Music therapist, but is now disabled Patient is a former smoker (quit smoking and drinking after 1st stroke).  Alcohol Use - no  Risk Factors: Exercise: no (03/03/2009)  Risk Factors: Smoking Status: quit (08/02/2009)  Review of Systems      See HPI  Physical Exam  General:  alert and cooperative to examination.   Eyes:  vision grossly intact, pupils equal, pupils round, and pupils reactive to light.  no injection and anicteric Neck:  supple and no carotid bruits.   Lungs:  normal respiratory effort, no accessory muscle use, normal breath sounds, no  crackles, and no wheezes.   Heart:  normal rate, regular rhythm, no murmur, no gallop, no rub, and no JVD.   Abdomen:  normal bowel sounds.   Pulses:  decreased peripheral pulses Extremities:  no edema, no cyanosis Neurologic:  alert & oriented X3, strength normal in all extremities, sensation intact to light touch, and gait normal.   Psych:  Oriented X3.  Flat affect  Diabetes Management Exam:    Foot Exam (with socks and/or shoes not present):       Sensory-Monofilament:          Left foot: abnormal          Right foot: abnormal   Impression & Recommendations:  Problem # 1:  HYPERTENSION (ICD-401.9) at goal no change check electrolytes and renal function given meds below  His updated medication list for this problem includes:    Norvasc 10 Mg Tabs (Amlodipine besylate) .Marland Kitchen... Take 1 tablet by mouth once a day    Cozaar 100 Mg Tabs (Losartan potassium) .Marland Kitchen... Take one  tablet by mouth once a day    Carvedilol 25 Mg Tabs (Carvedilol) .Marland Kitchen... Take 1 tablet by mouth two times a day  BP today: 122/79 Prior BP: 136/91 (03/03/2009)  Labs Reviewed: K+: 4.5 (03/03/2009) Creat: : 1.38 (03/03/2009)   Chol: 137 (03/03/2009)   HDL: 41 (03/03/2009)   LDL: 66 (03/03/2009)   TG: 150 (03/03/2009)  Problem # 2:  HYPERLIPIDEMIA (ICD-272.4) at goal no change  His updated medication list for this problem includes:    Lipitor 40 Mg Tabs (Atorvastatin calcium) .Marland Kitchen... Take 1 tablet by mouth once a day  Labs  Reviewed: SGOT: 18 (03/03/2009)   SGPT: 18 (03/03/2009)   HDL:41 (03/03/2009), 31 (02/26/2008)  LDL:66 (03/03/2009), 58 (02/26/2008)  Chol:137 (03/03/2009), 115 (02/26/2008)  Trig:150 (03/03/2009), 129 (02/26/2008)  Problem # 3:  DIABETES MELLITUS, TYPE II (ICD-250.00) at goal will follow up eye referral  His updated medication list for this problem includes:    Cozaar 100 Mg Tabs (Losartan potassium) .Marland Kitchen... Take one  tablet by mouth once a day    Metformin Hcl 500 Mg Tabs (Metformin hcl)  .Marland Kitchen... Take 1 tablet by mouth two times a day  Orders: T-Hgb A1C (in-house) ([address]UE) T- Capillary Blood Glucose ([address])  Labs Reviewed: Creat: 1.38 (03/03/2009)    Reviewed HgBA1c results: 6.5 (08/02/2009)  7.0 (03/03/2009)  Problem # 4:  SPECIAL SCREENING FOR MALIGNANT NEOPLASMS COLON (ICD-V76.51) will follow up on GI referral  Problem # 5:  RENAL INSUFFICIENCY, CHRONIC (ICD-585.9)  Will check renal panel today May need to consider iPTH in future  Labs Reviewed: BUN: 14 (03/03/2009)   Cr: 1.38 (03/03/2009)    Hgb: 11.4 (03/25/2008)   Hct: 36.5 (03/25/2008)   Ca++: 9.2 (03/03/2009)   Phos: 3.1 (08/18/2008) TP: 7.0 (03/03/2009)   Alb: 4.1 (03/03/2009)  Orders: T-Renal Function Panel ([id number])  Complete Medication List: 1)  K-tabs 20 Meq Tab(potassium Chloride)  .... Take 1 tablet by mouth once a day 2)  Aggrenox 25-200 Mg Cp12 (Aspirin-dipyridamole) .... Take 1 tablet by mouth two times a day 3)  Lipitor 40 Mg Tabs (Atorvastatin calcium) .... Take 1 tablet by mouth once a day 4)  Folic Acid 1 Mg Tabs (Folic acid) .... Take 1 tablet by mouth once a day 5)  Lancets Misc (Lancets) .... To test blood glucose 2x/day 6)  Freestyle Test Strp (Glucose blood) 7)  Norvasc 10 Mg Tabs (Amlodipine besylate) .... Take 1 tablet by mouth once a day 8)  Cozaar 100 Mg Tabs (Losartan potassium) .... Take one  tablet by mouth once a day 9)  Nexium 40 Mg Cpdr (Esomeprazole magnesium) .... Take 1 tablet by mouth once a day 10)  Amitriptyline Hcl 50 Mg Tabs (Amitriptyline hcl) .... Take 1 tab by mouth at bedtime 11)  Carvedilol 25 Mg Tabs (Carvedilol) .... Take 1 tablet by mouth two times a day 12)  Flector 1.3 % Ptch (Diclofenac epolamine) .... Apply patch in affected area two times a day 13)  Ultram 50 Mg Tabs (Tramadol hcl) .... Take 1 tab by mouth every 8 hours as needed for pain 14)  Metformin Hcl 500 Mg Tabs (Metformin hcl) .... Take 1 tablet by mouth two times a day  Patient  Instructions: 1)  Please schedule a follow-up appointment in 3 months. 2)  You are doing quite well overall. 3)  Continue your medications below. 4)  If you have any problems before your next visit, call clinic. 5)  Will check your kidney function and electrolytes today, if there is any problem with the results we will call you. 6)  Make sure to bring your medication bottles in next visit. Process Orders Check Orders Results:     [facility]: Check successful Order queued for requisitioning for [facility]: August 02, 2009 11:01 AM  Tests Sent for requisitioning (August 02, 2009 11:01 AM):     08/02/2009: [facility] -- T-Renal Function Panel [id number] (signed)    Prevention & Chronic Care Immunizations   Influenza vaccine: Fluvax 3+  (10/31/2008)   Influenza vaccine deferral: Not available  (08/02/2009)    Tetanus booster: Not  documented   Td booster deferral: Refused  (08/02/2009)    Pneumococcal vaccine: Not documented   Pneumococcal vaccine deferral: Deferred  (03/03/2009)    Immunization comments: Wants to discuss vaccine with wife  Colorectal Screening   Hemoccult: Not documented   Hemoccult action/deferral: Deferred  (11/17/2008)    Colonoscopy: Not documented   Colonoscopy action/deferral: GI referral  (11/17/2008)  Other Screening   PSA: Not documented   PSA action/deferral: Discussion deferred  (11/17/2008)   Smoking status: quit  (08/02/2009)  Diabetes Mellitus   HgbA1C: 6.5  (08/02/2009)    Eye exam: Not documented   Diabetic eye exam action/deferral: Ophthalmology referral  (11/17/2008)    Foot exam: yes  (08/02/2009)   Foot exam action/deferral: Do today   High risk foot: Yes  (08/02/2009)   Foot care education: Done  (08/02/2009)    Urine microalbumin/creatinine ratio: 24.3  (08/18/2008)   Urine microalbumin action/deferral: Ordered    Diabetes flowsheet reviewed?: Yes   Progress toward A1C goal: At  goal  Lipids   Total Cholesterol: 137  (03/03/2009)   Lipid panel action/deferral: Lipid Panel ordered   LDL: 66  (03/03/2009)   LDL Direct: Not documented   HDL: 41  (03/03/2009)   Triglycerides: 150  (03/03/2009)    SGOT (AST): 18  (03/03/2009)   BMP action: Ordered   SGPT (ALT): 18  (03/03/2009)   Alkaline phosphatase: 93  (03/03/2009)   Total bilirubin: 0.2  (03/03/2009)    Lipid flowsheet reviewed?: Yes   Progress toward LDL goal: At goal  Hypertension   Last Blood Pressure: 122 / 79  (08/02/2009)   Serum creatinine: 1.38  (03/03/2009)   BMP action: Ordered   Serum potassium 4.5  (03/03/2009)    Hypertension flowsheet reviewed?: Yes   Progress toward BP goal: At goal  Self-Management Support :   Personal Goals (by the next clinic visit) :     Personal A1C goal: 6  (03/03/2009)     Personal blood pressure goal: 130/80  (03/03/2009)     Personal LDL goal: 70  (03/03/2009)    Patient will work on the following items until the next clinic visit to reach self-care goals:     Medications and monitoring: take my medicines every day  (08/02/2009)     Eating: eat foods that are low in salt, eat baked foods instead of fried foods  (08/02/2009)     Other: Bring glucose meter to every visit  (08/02/2009)     Home glucose monitoring frequency: 2 times a day  (03/03/2009)    Diabetes self-management support: Education handout, Psychologist, forensic, Resources for patients handout, Written self-care plan  (08/02/2009)   Diabetes care plan printed   Diabetes education handout printed    Hypertension self-management support: Education handout, Pre-printed educational material, Resources for patients handout, Written self-care plan  (08/02/2009)   Hypertension self-care plan printed.   Hypertension education handout printed    Lipid self-management support: Education handout, Pre-printed educational material, Resources for patients handout, Written self-care plan   (08/02/2009)   Lipid self-care plan printed.   Lipid education handout printed      Resource handout printed.   Nursing Instructions: Diabetic foot exam today   Laboratory Results   Blood Tests   Date/Time Received: August 02, 2009 10:28 AM  Date/Time Reported: Burke Keels  August 02, 2009 10:28 AM   HGBA1C: 6.5%   (Normal Range: Non-Diabetic - 3-6%   Control Diabetic - 6-8%) CBG Fasting:: 100mg /dL

## 2010-02-13 NOTE — Progress Notes (Signed)
Summary: Refill/gh  Phone Note Refill Request Message from:  Fax from Pharmacy on August 18, 2009 11:28 AM  Refills Requested: Medication #1:  METFORMIN HCL 500 MG TABS Take 1 tablet by mouth two times a day.   Last Refilled: 05/17/2009  Method Requested: Electronic Initial call taken by: Angelina Ok RN,  August 18, 2009 11:28 AM  Follow-up for Phone Call        Rx faxed to pharmacy Follow-up by: Mariea Stable MD,  August 18, 2009 12:28 PM    Prescriptions: METFORMIN HCL 500 MG TABS (METFORMIN HCL) Take 1 tablet by mouth two times a day  #62 Tablet x 3   Entered and Authorized by:   Mariea Stable MD   Signed by:   Mariea Stable MD on 08/18/2009   Method used:   Faxed to ...       [facility] (retail)       [address]       [place], [place]  [address]       Ph: [phone]       Fax: [phone]   RxID:   [id number]

## 2010-02-13 NOTE — Progress Notes (Signed)
Summary: med refill/gp  Phone Note Refill Request Message from:  Patient on June 29, 2009 1:56 PM  Refills Requested: Medication #1:  METFORMIN HCL 500 MG TABS Take 1 tablet by mouth two times a day. Last appt. 03/03/09.   Method Requested: Electronic Initial call taken by: Chinita Pester RN,  June 29, 2009 1:54 PM  Follow-up for Phone Call        Will change to glipizide given renal insufficiency.  Although not very bad, his Cr has fluctuated in the past.  Follow-up by: Mariea Stable MD,  June 29, 2009 2:20 PM  Additional Follow-up for Phone Call Additional follow up Details #1::        I saw that Dr Onalee Hua plans to change the pt from metformin to glipizide.  The change hasn't been made yet and Dr Onalee Hua is out for one week.  Pt hasn't been seen since 03/03/09, at which time metformin was started.  The past two creatinines were 1.36 and 1.83, although in 2009 was elevated at 1.7 ish.  Since pt is due for an appt anyway (last A1C was 2/11) I will refill 30 days of metformin, schedule appt with Dr Onalee Hua so that the change can be discussed in person.  A falg was sent to [facility] to schedule an appt with pt. Additional Follow-up by: Blanch Media MD,  June 30, 2009 10:31 AM    Prescriptions: METFORMIN HCL 500 MG TABS (METFORMIN HCL) Take 1 tablet by mouth two times a day  #62 x 0   Entered and Authorized by:   Blanch Media MD   Signed by:   Blanch Media MD on 06/30/2009   Method used:   Electronically to        [facility] [address] (retail)       [address]       [place], [place]  [address]       Ph: [phone] or [phone]       Fax: [phone]   RxID:   [id number]

## 2010-02-13 NOTE — Progress Notes (Signed)
Summary: Amitriptyline  Phone Note Outgoing Call   Call placed by: Angelina Ok RN,  December 04, 2009 2:06 PM Call placed to: Patient Summary of Call: RTC to pt's wife about the increase of the Amitriptylene.  Dr. Cathey Endow was consulted and said that it was agreed upon that the pt did not want changes to his medication.  Spoke with pt's wife who said that pt does not sleep well at night.  Roams the neighborhood while she is away at work.  Pt has also started yelling at her in public and especiallt at church.  Wife stated that pt also has called the police on neighbors saying that they have stolen his property.  Wife also said that pt is using his chain saw when she is away and is afraid that he will hurt himself.  Wife said that it is necessary for her to go to work to maintain the household and receives calls about the pt's activities during the day.  Wife said that pt is driving her crazy and is afriad that she will be locked up because people do not believe pt is acting this way.  Message sent to Dr. Cathey Endow. Angelina Ok RN  December 04, 2009 2:11 PM  Initial call taken by: Angelina Ok RN,  December 04, 2009 2:12 PM  Follow-up for Phone Call        I spoke to the patient's wife who wanted to increase in dose of amitryptiline at night so he would be less "contrary" during the day.  I explained to the patient's wife that I did not wish to increase the patient's dose at this time given that he stated that his insomnia was under control at this time and he did not wish to change his medications.  I counseled the wife on sleep hygiene and recommended continued lifestyle modifications to improve the patients sleep at this time.  The wife's major concern at this time appears to be the patient's behavioral issues during the day though the wife does feel that he is currently safe in the home he is just not "listening to her" though he is "very bright".  Given this, I recommended that they schedule an  appointment with Dr. Sandie Ano at his next available office visit to discuss the patients behavioral issues and determine whether ALF or SNF would be more appropriate than home.  I spoke to the patient at length regarding the importance of listening to his caretakers regarding his behavior during our visit and he agreed to improve.  Follow-up by: Sinda Du MD,  December 05, 2009 2:52 PM

## 2010-02-13 NOTE — Progress Notes (Signed)
Summary: med refil/gp  Phone Note Refill Request Message from:  Fax from Pharmacy on October 25, 2009 4:26 PM  Refills Requested: Medication #1:  AMITRIPTYLINE HCL 50 MG  TABS Take 1 tab by mouth at bedtime   Last Refilled: 09/15/2009 Last appt. 7/20.  Next appt. 11/16.   Method Requested: Electronic Initial call taken by: Chinita Pester RN,  October 25, 2009 4:26 PM  Follow-up for Phone Call        Rx faxed to pharmacy Follow-up by: Mariea Stable MD,  October 26, 2009 12:18 PM    Prescriptions: AMITRIPTYLINE HCL 50 MG  TABS (AMITRIPTYLINE HCL) Take 1 tab by mouth at bedtime  #31 x 6   Entered and Authorized by:   Mariea Stable MD   Signed by:   Mariea Stable MD on 10/26/2009   Method used:   [facility] [address] [phone][facility] (retail)       [address]       [place], [place]  [address]       Ph: [phone] or [phone]       Fax: [phone]   RxID:   [id number]

## 2010-02-13 NOTE — Progress Notes (Signed)
Summary: refill/ hla  Phone Note Refill Request Message from:  Fax from Pharmacy on September 26, 2009 3:15 PM  Refills Requested: Medication #1:  FOLIC ACID 1 MG TABS Take 1 tablet by mouth once a day   Dosage confirmed as above?Dosage Confirmed   Last Refilled: 7/12 Initial call taken by: Marin Roberts RN,  September 26, 2009 3:15 PM  Follow-up for Phone Call        Last seen in July Follow-up by: Blanch Media MD,  September 26, 2009 3:40 PM    Prescriptions: FOLIC ACID 1 MG TABS (FOLIC ACID) Take 1 tablet by mouth once a day  #31 x 4   Entered and Authorized by:   Blanch Media MD   Signed by:   Blanch Media MD on 09/26/2009   Method used:   Electronically to        [facility] [phone]* (retail)       [address]       [place], [place]  [address]       Ph: [phone]       Fax: [phone]   RxID:   [id number]

## 2010-02-13 NOTE — Progress Notes (Signed)
Summary: med ?/ hla  Phone Note Call from Patient   Summary of Call: pt's spouse calls to say at last visit no scripts were called in and she specifically wanted to know about ultram and flector, these were 1 time orders from notes and just have not been removed from med list, she now understands. Initial call taken by: Marin Roberts RN,  August 25, 2009 3:30 PM

## 2010-02-13 NOTE — Progress Notes (Signed)
Summary: med refill/gp  Phone Note Refill Request Message from:  Fax from Pharmacy on Jun 13, 2009 9:52 AM  Refills Requested: Medication #1:  LIPITOR 40 MG  TABS Take 1 tablet by mouth once a day   Last Refilled: 03/03/2009 Last OV/labs 03/03/09.   Method Requested: Telephone to Pharmacy Initial call taken by: Chinita Pester RN,  Jun 13, 2009 9:52 AM  Follow-up for Phone Call        Rx faxed to pharmacy Follow-up by: Mariea Stable MD,  Jun 13, 2009 10:30 AM    Prescriptions: LIPITOR 40 MG  TABS (ATORVASTATIN CALCIUM) Take 1 tablet by mouth once a day  #31 x 11   Entered and Authorized by:   Mariea Stable MD   Signed by:   Mariea Stable MD on 06/13/2009   Method used:   Faxed to ...       [facility] (retail)       [address] [place], [place]  [address]       Ph: [phone]       Fax: [phone]   RxID:   [id number]

## 2010-02-13 NOTE — Progress Notes (Signed)
Summary: Jury duty  Phone Note Call from Patient   Caller: Spouse Call For: Mariea Stable MD Summary of Call: Call from pt's wife.  Pt needs a note to excuse him from [employer]. Wife said he is scheduled to serve on 05/10/2009.  Wife said that she will pick up the letter on  05/02/2009 Angelina Ok RN  May 01, 2009 4:35 PM   Initial call taken by: Angelina Ok RN,  May 01, 2009 4:35 PM  Follow-up for Phone Call        letter done, see above document Follow-up by: Mariea Stable MD,  May 02, 2009 3:14 PM

## 2010-02-13 NOTE — Progress Notes (Signed)
Summary: Cough  Phone Note Call from Patient   Caller: Spouse Call For: Mariea Stable MD Summary of Call: Pt's wife her for an appointment said that pt has a non productive cough.  No fevers, chills or shortness of breath.  She was given Cheratussin and would like fot pt to get a prescription as well if possible.  She wouls like for the prescription to be sent to the [facility] on [address].Angelina Ok RN  December 18, 2009 3:03 PM  Initial call taken by: Angelina Ok RN,  December 18, 2009 3:03 PM  Follow-up for Phone Call        Will give prescription.  Pt is to call clinic for an appointment if symptoms persist or worsen.  Called in.   Follow-up by: Mariea Stable MD,  December 19, 2009 7:22 PM    New/Updated Medications: CHERATUSSIN AC 100-10 MG/5ML SYRP (GUAIFENESIN-CODEINE) Take 1 teaspoon every 6 hours as needed for cough Prescriptions: CHERATUSSIN AC 100-10 MG/5ML SYRP (GUAIFENESIN-CODEINE) Take 1 teaspoon every 6 hours as needed for cough  #122ml x 0   Entered and Authorized by:   Mariea Stable MD   Signed by:   Mariea Stable MD on 12/19/2009   Method used:   [facility] ...       [facility] [address] [phone]* (retail)       [address]       [place], [place]  [address]       Ph: [phone] or [phone]       Fax: [phone]   RxID:   [id number]   Appended Document: Cough Prescription for Cheratussin cough syrup [facility] [address]. Angelina Ok, RN December 20, 2009 9:08 AM

## 2010-02-13 NOTE — Progress Notes (Signed)
Summary: PREVENTIVE COLONOSCOPY  Phone Note Outgoing Call   Summary of Call: Patient was sch/for a Colonoscopy on 01/06/2008 with Dr. Franne Forts office. Checked EMR and ECHART.  No evidence found where patient actually had procedure performed. Contacted the patient's wife who is his caregiver and she stated that he never kept his sch appointment.  Mrs. Krueger stated she would like to have it resch.  Patient currently has medicare as his insurance. Initial call taken by: Shon Hough,  November 28, 2009 9:57 AM

## 2010-02-13 NOTE — Progress Notes (Signed)
Summary: Refill/gh  Phone Note Refill Request Message from:  Fax from Pharmacy on October 27, 2009 10:10 AM  Refills Requested: Medication #1:  NORVASC 10 MG TABS Take 1 tablet by mouth once a day   Last Refilled: 08/10/2009  Method Requested: Fax to Local Pharmacy Initial call taken by: Angelina Ok RN,  October 27, 2009 10:10 AM  Follow-up for Phone Call        Rx faxed to pharmacy Follow-up by: Mariea Stable MD,  October 27, 2009 1:37 PM    Prescriptions: NORVASC 10 MG TABS (AMLODIPINE BESYLATE) Take 1 tablet by mouth once a day  #30 x prn   Entered and Authorized by:   Mariea Stable MD   Signed by:   Mariea Stable MD on 10/27/2009   Method used:   Faxed to ...       [facility] Department (retail)       [address] [place], [place]  [address]       Ph: [phone]       Fax: [phone]   RxID:   [id number]

## 2010-02-13 NOTE — Progress Notes (Signed)
Summary: med change/gp  Phone Note Refill Request Message from:  Fax from Pharmacy on September 06, 2009 11:37 AM  Refills Requested: Medication #1:  NEXIUM 40 MG  CPDR Take 1 tablet by mouth once a day [facility] MAP states pt. does not qualify for pt. assistance; requests it to be changed to Omeprazole.   Method Requested: Telephone to Pharmacy Initial call taken by: Chinita Pester RN,  September 06, 2009 11:38 AM  Follow-up for Phone Call        Rx faxed to pharmacy changed to omeprazole Follow-up by: Mariea Stable MD,  September 06, 2009 12:03 PM    New/Updated Medications: OMEPRAZOLE 40 MG CPDR (OMEPRAZOLE) Take 1 tablet by mouth once a day Prescriptions: OMEPRAZOLE 40 MG CPDR (OMEPRAZOLE) Take 1 tablet by mouth once a day  #30 x 3   Entered and Authorized by:   Mariea Stable MD   Signed by:   Mariea Stable MD on 09/06/2009   Method used:   Faxed to ...       [facility] Department (retail)       [address] [place], [place]  [address]       Ph: [phone]       Fax: [phone]   RxID:   [id number]   Appended Document: med change/gp Omeprazole RX called to [facility] pharmacy.

## 2010-02-13 NOTE — Letter (Signed)
Summary: Generic on Letterhead Paper  [facility]  [address]   [place], [place] [address]   Phone: [phone]  Fax: [phone]     Today's Date:  May 02, 2009  Re:  [facility] [place]   To Whom It May Concern,  Mr. Ricketson is followed in our clinic for multiple medical problems including a history of three prior strokes which have affected his memory and ability to interact appropriately.  He has recieved a letter for jury duty which I do not feel he can adequately participate in.  Please excuse him from jury duty indefinitely and feel free to contact our clinic if there is further information required.   Sincerely,    Mariea Stable MD  Appended Document: Generic on Letterhead Paper Letter given to pt's wife. Angelina Ok, RN May 02, 2009 3:19 PM

## 2010-02-13 NOTE — Assessment & Plan Note (Signed)
Summary: ACUTE-BACK PAIN-(ALVAREZ)/CFB   Vital Signs:  Patient profile:   60 year old male Height:      68 inches (172.72 cm) Weight:      261.7 pounds (118.95 kg) BMI:     39.94 Temp:     97.0 degrees F (36.11 degrees C) oral Pulse rate:   77 / minute BP sitting:   136 / 91  (right arm)  Vitals Entered By: [name] Kidney Ditzler RN (March 03, 2009 3:26 PM) Is Patient Diabetic? Yes Did you bring your meter with you today? No Pain Assessment Patient in pain? yes     Location: left shoulder and back Intensity: 6 Type: pain Onset of pain  past 3 days Nutritional Status BMI of > 30 = obese Nutritional Status Detail appetite good CBG Result 140  Have you ever been in a relationship where you felt threatened, hurt or afraid?denies   Does patient need assistance? Functional Status Self care Ambulation Normal Comments [name] [name] with pt. Discuss back pain.   Primary Care Provider:  Carlus Pavlov MD   History of Present Illness: [name] [name] [name] is a 60 yo man with pMH as outlined in the EMR.  who comes to the clinic for followup of his DM, HLD and HTN. Patient is doing well in general, except for back pain on his left side, throracic area, radiated to his shoulder since Tuesday (3 days ago). He denies traumatic injury or any precipitating factor that he can remember.  Patient reprots to be compliant with his medications.  All the rest of his ROS was negative.   Depression History:      The patient denies a depressed mood most of the day and a diminished interest in his usual daily activities.        The patient denies that he feels like life is not worth living, denies that he wishes that he were dead, and denies that he has thought about ending his life.         Preventive Screening-Counseling & Management  Alcohol-Tobacco     Smoking Status: quit     Year Quit: 2007     Pack years: 600+  Caffeine-Diet-Exercise     Does Patient Exercise: no  Problems Prior to Update: 1)  Back  Pain, Thoracic Region, Left  (ICD-724.1) 2)  Chest Pain  (ICD-786.50) 3)  Special Screening For Malignant Neoplasms Colon  (ICD-V76.51) 4)  Other Constipation  (ICD-564.09) 5)  Anemia, Normocytic, Chronic  (ICD-285.9) 6)  Dysgeusia  (ICD-781.1) 7)  Chest Pain, Left  (ICD-786.50) 8)  Uri  (ICD-465.9) 9)  Depression  (ICD-311) 10)  Other General Symptoms  (ICD-780.99) 11)  Sleepiness  (ICD-780.09) 12)  Dry Cough  (ICD-786.2) 13)  Renal Insufficiency, Chronic  (ICD-585.9) 14)  Hyperlipidemia  (ICD-272.4) 15)  Pulmonary Congestion  (ICD-514) 16)  Cerebrovascular Accident, Hx of  (ICD-V12.50) 17)  Screen For Condition Nos  (ICD-V82.9) 18)  Hypertension  (ICD-401.9) 19)  Diabetes Mellitus, Type II  (ICD-250.00)  Current Problems (verified): 1)  Chest Pain  (ICD-786.50) 2)  Special Screening For Malignant Neoplasms Colon  (ICD-V76.51) 3)  Other Constipation  (ICD-564.09) 4)  Anemia, Normocytic, Chronic  (ICD-285.9) 5)  Dysgeusia  (ICD-781.1) 6)  Chest Pain, Left  (ICD-786.50) 7)  Uri  (ICD-465.9) 8)  Depression  (ICD-311) 9)  Other General Symptoms  (ICD-780.99) 10)  Sleepiness  (ICD-780.09) 11)  Dry Cough  (ICD-786.2) 12)  Renal Insufficiency, Chronic  (ICD-585.9) 13)  Hyperlipidemia  (ICD-272.4) 14)  Pulmonary Congestion  (  ICD-514) 15)  Cerebrovascular Accident, Hx of  (ICD-V12.50) 16)  Screen For Condition Nos  (ICD-V82.9) 17)  Hypertension  (ICD-401.9) 18)  Diabetes Mellitus, Type II  (ICD-250.00)  Medications Prior to Update: 1)  K-Tabs 20 Meq Tab(Potassium Chloride) .... Take 1 Tablet By Mouth Once A Day 2)  Aggrenox 25-200 Mg Cp12 (Aspirin-Dipyridamole) .... Take 1 Tablet By Mouth Two Times A Day 3)  Lipitor 40 Mg  Tabs (Atorvastatin Calcium) .... Take 1 Tablet By Mouth Once A Day 4)  Folic Acid 1 Mg Tabs (Folic Acid) .... Take 1 Tablet By Mouth Once A Day 5)  Lancets  Misc (Lancets) .... To Test Blood Glucose 2x/day 6)  Freestyle Test  Strp (Glucose Blood) 7)  Norvasc  10 Mg Tabs (Amlodipine Besylate) .... Take 1 Tablet By Mouth Once A Day 8)  Cozaar 100 Mg  Tabs (Losartan Potassium) .... Take One  Tablet By Mouth Once A Day 9)  Nexium 40 Mg  Cpdr (Esomeprazole Magnesium) .... Take 1 Tablet By Mouth Once A Day 10)  Amitriptyline Hcl 50 Mg  Tabs (Amitriptyline Hcl) .... Take 1 Tab By Mouth At Bedtime 11)  Mucinex 600 Mg Xr12h-Tab (Guaifenesin) .... Take 1-2 Tablets By Mouth Two Times A Day As Needed For Dry Cough 12)  Carvedilol 25 Mg Tabs (Carvedilol) .... Take 1 Tablet By Mouth Two Times A Day  Current Medications (verified): 1)  K-Tabs 20 Meq Tab(Potassium Chloride) .... Take 1 Tablet By Mouth Once A Day 2)  Aggrenox 25-200 Mg Cp12 (Aspirin-Dipyridamole) .... Take 1 Tablet By Mouth Two Times A Day 3)  Lipitor 40 Mg  Tabs (Atorvastatin Calcium) .... Take 1 Tablet By Mouth Once A Day 4)  Folic Acid 1 Mg Tabs (Folic Acid) .... Take 1 Tablet By Mouth Once A Day 5)  Lancets  Misc (Lancets) .... To Test Blood Glucose 2x/day 6)  Freestyle Test  Strp (Glucose Blood) 7)  Norvasc 10 Mg Tabs (Amlodipine Besylate) .... Take 1 Tablet By Mouth Once A Day 8)  Cozaar 100 Mg  Tabs (Losartan Potassium) .... Take One  Tablet By Mouth Once A Day 9)  Nexium 40 Mg  Cpdr (Esomeprazole Magnesium) .... Take 1 Tablet By Mouth Once A Day 10)  Amitriptyline Hcl 50 Mg  Tabs (Amitriptyline Hcl) .... Take 1 Tab By Mouth At Bedtime 11)  Carvedilol 25 Mg Tabs (Carvedilol) .... Take 1 Tablet By Mouth Two Times A Day  Allergies: 1)  ! Depakote (Divalproex Sodium)  Past History:  Past Medical History: Last updated: 11/17/2007 s/p 3 strokes (04, 06 and 07/2005) did not see a doctor before the strokes Diabetes mellitus, type II Hypertension Weakness in his legs and arms, but worse in his legs Depression  Family History: Last updated: 12/21/2007 Family History of Diabetes: mom grandmother No FH of Colon Cancer:  Social History: Last updated: 08/17/2008 Married. His [name] [name] cares  for him and she works for [employer]. Used to be a Music therapist, but is now disabled Patient is a former smoker (quit smoking and drinking after 1st stroke).  Alcohol Use - no  Risk Factors: Smoking Status: quit (03/03/2009)  Review of Systems       As per HPi.  Physical Exam  General:  alert and cooperative to examination.   Lungs:  normal respiratory effort, no accessory muscle use, normal breath sounds, no crackles, and no wheezes.   Heart:  normal rate, regular rhythm, no murmur, no gallop, no rub, and no JVD.  Abdomen:  soft, non-tender, normal bowel sounds, and no distention.   Msk:  Tanderness to palpation on his back mid thoracic area, tension of left intercostal muscles appreciated.  Extremities:  no edema, no cyanosis Neurologic:  alert & oriented X3, cranial nerves II-XII intact, strength normal in all extremities, and sensation intact to light touch.  hypereflexia appreciated, especially on his lef LUE and LLE    Impression & Recommendations:  Problem # 1:  BACK PAIN, THORACIC REGION, LEFT (ICD-724.1) Patient with pain on his left throacic area. Will give conservative treatment with tramadol, flexeril and flector patch. Patient will keephimself active but will avoid painful activities. I have discussed use of moist heat or ice, modified activities, medications, and stretching/strengthening exercises. Back care instructions given. To be seen in 2 weeks if no improvement; sooner if worsening of symptoms.   His updated medication list for this problem includes:    Ultram 50 Mg Tabs (Tramadol hcl) .Marland Kitchen... Take 1 tab by mouth every 8 hours as needed for pain    Flexeril 5 Mg Tabs (Cyclobenzaprine hcl) .Marland Kitchen... Take 1 tab by mouth at bedtime  Problem # 2:  HYPERLIPIDEMIA (ICD-272.4) At goal. LDL 66. LFT'sWNL. Will continue current statin regimen.Patient advised to follow a low fat diet.  His updated medication list for this problem includes:    Lipitor 40 Mg  Tabs (Atorvastatin calcium) .Marland Kitchen... Take 1 tablet by mouth once a day  Orders: T-Lipid Profile ([id number])  Problem # 3:  HYPERTENSION (ICD-401.9) Well controlled and at goal. Willcontinue current regimen and will advised him to folow a low sodium diet. Electrolytes and renal function checked today and stable.  His updated medication list for this problem includes:    Norvasc 10 Mg Tabs (Amlodipine besylate) .Marland Kitchen... Take 1 tablet by mouth once a day    Cozaar 100 Mg Tabs (Losartan potassium) .Marland Kitchen... Take one  tablet by mouth once a day    Carvedilol 25 Mg Tabs (Carvedilol) .Marland Kitchen... Take 1 tablet by mouth two times a day  BP today: 136/91 Prior BP: 130/85 (11/17/2008)  Labs Reviewed: K+: 4.1 (08/18/2008) Creat: : 1.36 (08/18/2008)   Chol: 115 (02/26/2008)   HDL: 31 (02/26/2008)   LDL: 58 (02/26/2008)   TG: 129 (02/26/2008)  Problem # 4:  DIABETES MELLITUS, TYPE II (ICD-250.00) A1C 7.0; patient was following diet control, but his A1C continue climbing w/o pharmaceutical intervention. will start metformin 500mg  two times a day and will recommend low carb diet (less than 80 grams daily).  His updated medication list for this problem includes:    Cozaar 100 Mg Tabs (Losartan potassium) .Marland Kitchen... Take one  tablet by mouth once a day    Metformin Hcl 500 Mg Tabs (Metformin hcl) .Marland Kitchen... Take 1 tablet by mouth two times a day  Orders: T- Capillary Blood Glucose (09811) T-Hgb A1C (in-house) ([id number])  Labs Reviewed: Creat: 1.36 (08/18/2008)    Reviewed HgBA1c results: 7.0 (03/03/2009)  6.9 (11/17/2008)  Problem # 5:  CEREBROVASCULAR ACCIDENT, HX OF (ICD-V12.50) Patient w/o new deficit. Will continue risk factor modification and will continue aggrenox.  Complete Medication List: 1)  K-tabs 20 Meq Tab(potassium Chloride)  .... Take 1 tablet by mouth once a day 2)  Aggrenox 25-200 Mg Cp12 (Aspirin-dipyridamole) .... Take 1 tablet by mouth two times a day 3)  Lipitor 40 Mg Tabs (Atorvastatin calcium)  .... Take 1 tablet by mouth once a day 4)  Folic Acid 1 Mg Tabs (Folic acid) .... Take 1 tablet by mouth once  a day 5)  Lancets Misc (Lancets) .... To test blood glucose 2x/day 6)  Freestyle Test Strp (Glucose blood) 7)  Norvasc 10 Mg Tabs (Amlodipine besylate) .... Take 1 tablet by mouth once a day 8)  Cozaar 100 Mg Tabs (Losartan potassium) .... Take one  tablet by mouth once a day 9)  Nexium 40 Mg Cpdr (Esomeprazole magnesium) .... Take 1 tablet by mouth once a day 10)  Amitriptyline Hcl 50 Mg Tabs (Amitriptyline hcl) .... Take 1 tab by mouth at bedtime 11)  Carvedilol 25 Mg Tabs (Carvedilol) .... Take 1 tablet by mouth two times a day 12)  Flector 1.3 % Ptch (Diclofenac epolamine) .... Apply patch in affected area two times a day 13)  Ultram 50 Mg Tabs (Tramadol hcl) .... Take 1 tab by mouth every 8 hours as needed for pain 14)  Flexeril 5 Mg Tabs (Cyclobenzaprine hcl) .... Take 1 tab by mouth at bedtime 15)  Metformin Hcl 500 Mg Tabs (Metformin hcl) .... Take 1 tablet by mouth two times a day  Other Orders: T-Comprehensive Metabolic Panel ([id number])  Patient Instructions: 1)  Most patients (90%) with low back pain will improve with time (2-6 weeks). Keep active but avoid activities that are painful. Apply moist heat and/or ice to lower back several times a day. 2)  Please schedule a follow-up appointment in 3 months. 3)  Take your medications as prescribed. 4)  Remember to apply patch in affected area twice a day. 5)  Follow a low sodium (less than 2 gramsdaily) diet and also a low carb diet(less than 75grams daily). Prescriptions: ULTRAM 50 MG TABS (TRAMADOL HCL) Take 1 tab by mouth every 8 hours as needed for pain  #45 x 0   Entered and Authorized by:   Vassie Loll MD   Signed by:   Vassie Loll MD on 03/03/2009   Method used:   [facility]ron[facility] to        [facility] [address] [phone]* (retail)       [address]       [place], [place]  [address]        Ph: [phone] or [phone]       Fax: [phone]   RxID:   [id number] METFORMIN HCL 500 MG TABS (METFORMIN HCL) Take 1 tablet by mouth two times a day  #62 x 3   Entered and Authorized by:   Vassie Loll MD   Signed by:   Vassie Loll MD on 03/03/2009   Method used:   [facility]        [facility] [address] [phone]* (retail)       [address]       [place], [place]  [address]       Ph: [phone] or [phone]       Fax: [phone]   RxID:   [id number] FLEXERIL 5 MG TABS (CYCLOBENZAPRINE HCL) Take 1 tab by mouth at bedtime  #10 x 0   Entered and Authorized by:   Vassie Loll MD   Signed by:   Vassie Loll MD on 03/03/2009   Method used:   [facility]        [facility] [address] [phone]* (retail)       [address]       [place], [place]  [address]  Ph: [phone] or [phone]       Fax: [phone]   RxID:   [id number] ULTRACET 37.5-325 MG TABS (TRAMADOL-ACETAMINOPHEN) Take 1 tab by mouth every 8 hours as needed for pain  #45 x 0   Entered and Authorized by:   Vassie Loll MD   Signed by:   Vassie Loll MD on 03/03/2009   Method used:   [facility]        [facility] [address] [phone]* (retail)       [address]       [place], [place]  [address]       Ph: [phone] or [phone]       Fax: [phone]   RxID:   [id number] FLECTOR 1.3 % PTCH (DICLOFENAC EPOLAMINE) apply patch in affected area two times a day  #1 x 0   Entered and Authorized by:   Vassie Loll MD   Signed by:   Vassie Loll MD on 03/03/2009   Method used:   [facility]        [facility] [address] [phone]* (retail)       [address]       [place], [place]  [address]       Ph: [phone] or [phone]       Fax: [phone]   RxID:   [id number]  Process Orders Check Orders Results:     [facility]: Check  successful Tests Sent for requisitioning (March 08, 2009 4:27 PM):     03/03/2009: [facility] -- T-Lipid Profile [phone] (signed)     03/03/2009: [facility] -- T-Comprehensive Metabolic Panel [phone] (signed)    Prevention & Chronic Care Immunizations   Influenza vaccine: Fluvax 3+  (10/31/2008)   Influenza vaccine deferral: Deferred  (03/03/2009)    Tetanus booster: Not documented   Td booster deferral: Deferred  (03/03/2009)    Pneumococcal vaccine: Not documented   Pneumococcal vaccine deferral: Deferred  (03/03/2009)  Colorectal Screening   Hemoccult: Not documented   Hemoccult action/deferral: Deferred  (11/17/2008)    Colonoscopy: Not documented   Colonoscopy action/deferral: GI referral  (11/17/2008)  Other Screening   PSA: Not documented   PSA action/deferral: Discussion deferred  (11/17/2008)   Smoking status: quit  (03/03/2009)  Diabetes Mellitus   HgbA1C: 7.0  (03/03/2009)    Eye exam: Not documented   Diabetic eye exam action/deferral: Ophthalmology referral  (11/17/2008)    Foot exam: yes  (08/17/2008)   Foot exam action/deferral: Do today   High risk foot: Not documented   Foot care education: Not documented    Urine microalbumin/creatinine ratio: 24.3  (08/18/2008)   Urine microalbumin action/deferral: Ordered    Diabetes flowsheet reviewed?: Yes   Progress toward A1C goal: Unchanged  Lipids   Total Cholesterol: 115  (02/26/2008)   Lipid panel action/deferral: Lipid Panel ordered   LDL: 58  (02/26/2008)   LDL Direct: Not documented   HDL: 31  (02/26/2008)   Triglycerides: 129  (02/26/2008)    SGOT (AST): 15  (08/18/2008)   BMP action: Ordered   SGPT (ALT): 14  (08/18/2008) CMP ordered    Alkaline phosphatase: 93  (08/18/2008)   Total bilirubin: 0.2  (08/18/2008)    Lipid flowsheet reviewed?: Yes   Progress toward LDL goal: At goal  Hypertension   Last Blood Pressure: 136 / 91   (03/03/2009)   Serum creatinine:  1.36  (08/18/2008)   BMP action: Ordered   Serum potassium 4.1  (08/18/2008) CMP ordered     Hypertension flowsheet reviewed?: Yes   Progress toward BP goal: At goal  Self-Management Support :   Personal Goals (by the next clinic visit) :     Personal A1C goal: 6  (03/03/2009)     Personal blood pressure goal: 130/80  (03/03/2009)     Personal LDL goal: 70  (03/03/2009)    Patient will work on the following items until the next clinic visit to reach self-care goals:     Medications and monitoring: take my medicines every day, check my blood sugar, check my blood pressure, bring all of my medications to every visit, examine my feet every day  (03/03/2009)     Eating: drink diet soda or water instead of juice or soda, eat more vegetables, use fresh or frozen vegetables, eat foods that are low in salt, eat fruit for snacks and desserts, limit or avoid alcohol  (03/03/2009)     Home glucose monitoring frequency: 2 times a day  (03/03/2009)    Diabetes self-management support: Resources for patients handout, Written self-care plan  (03/03/2009)   Diabetes care plan printed    Hypertension self-management support: Resources for patients handout, Written self-care plan  (03/03/2009)   Hypertension self-care plan printed.    Lipid self-management support: Resources for patients handout, Written self-care plan  (03/03/2009)   Lipid self-care plan printed.      Resource handout printed.  Laboratory Results   Blood Tests   Date/Time Received: March 03, 2009 3:42 PM  Date/Time Reported: Burke Keels  March 03, 2009 3:42 PM   HGBA1C: 7.0%   (Normal Range: Non-Diabetic - 3-6%   Control Diabetic - 6-8%) CBG Random:: 140mg /dL

## 2010-02-13 NOTE — Assessment & Plan Note (Signed)
Summary: ACUTE/ALVAREZ/NEEDS DIFFERENT MEDS TO HELP HIM SLEEP/CH   Vital Signs:  Patient profile:   60 year old male Height:      68 inches (172.72 cm) Weight:      238.2 pounds (108.27 kg) BMI:     36.35 Temp:     96.8 degrees F (36 degrees C) oral Pulse rate:   91 / minute BP sitting:   145 / 95  (right arm) Cuff size:   large  Vitals Entered By: Theotis Barrio NT II (December 04, 2009 11:44 AM) CC: FU Is Patient Diabetic? Yes Did you bring your meter with you today? Yes Pain Assessment Patient in pain? no      Nutritional Status BMI of > 30 = obese CBG Result 102  Have you ever been in a relationship where you felt threatened, hurt or afraid?No   Does patient need assistance? Functional Status Self care Ambulation Normal   Primary Care Provider:  Carlus Pavlov MD  CC:  FU.  History of Present Illness: This is a 60 year old male with a hx of chronic renal insufficiency (1.26-1.7), CVA 5 years ago, and type II DM who presents for follow up after seeing Dr. Sandie Ano in 7/11.  At that visit, Dr. Sandie Ano planned to follow up on his GI and ophthalmology referrals.    With regards to the patients DM, Pt is not prescribing to any particular diet, however, his random CBG's have typically been around 130.  The patient's wife is the primary care giver and has experienced significant frustration regarding the care of her husband.  The wife has concerns about the patient being "contrary" although she does feel that he understands her perfectly.    The patient does have a history of insomnia and though he continues to have difficulty staying asleep at night, the patient requested that no changes be made in his medications today.    Allergies: 1)  ! Depakote (Divalproex Sodium)  Past History:  Past Medical History: Last updated: 11/17/2007 s/p 3 strokes (04, 06 and 07/2005) did not see a doctor before the strokes Diabetes mellitus, type II Hypertension Weakness in his  legs and arms, but worse in his legs Depression  Family History: Reviewed history from 12/21/2007 and no changes required. Family History of Diabetes: mom grandmother No FH of Colon Cancer:  Social History: Reviewed history from 08/17/2008 and no changes required. Married. His wife cares for him and she works for [employer]. Used to be a Music therapist, but is now disabled Patient is a former smoker (quit smoking and drinking after 1st stroke).  Alcohol Use - no  Review of Systems       Pt denies any weight change, fever, chills or change in his BM's.   Physical Exam  General:  Alert, oriented, NAD Repeat BP: 128/80 Head:  normocephalic and atraumatic.   Eyes:  vision grossly intact, pupils equal, pupils round, and pupils reactive to light.   Mouth:  pharynx pink and moist.   Lungs:  normal respiratory effort, normal breath sounds, no crackles, and no wheezes.   Heart:  normal rate, regular rhythm, no murmur, no gallop, and no rub.   Abdomen:  soft, non-tender, normal bowel sounds, and no distention.   Pulses:  2+ dp/pt pulses bilaterally.  Neurologic:  decreased sensation in palm and back of left hand. cranial nerves II-XII intact.  5/5 strength through out.  Skin:  No rashes.    Impression & Recommendations:  Problem # 1:  Hx of INSOMNIA (ICD-780.52) This appears to be stable at this time and the patient states that he is managing this with his amitryptyline.  Pt specifically stated that he did not wish to make any changes to his medications at this time.   His updated medication list for this problem includes:    Amitriptyline Hcl 50 Mg Tabs (Amitriptyline hcl) .Marland Kitchen... Take 1 tab by mouth at bedtime  Problem # 2:  DIABETES MELLITUS, TYPE II (ICD-250.00) At goal: HGB A1C 6.2 today.  Pt told to continue to watch his diet.   His updated medication list for this problem includes:    Cozaar 100 Mg Tabs (Losartan potassium) .Marland Kitchen... Take one  tablet by mouth once  a day    Metformin Hcl 500 Mg Tabs (Metformin hcl) .Marland Kitchen... Take 1 tablet by mouth two times a day  Orders: T- Capillary Blood Glucose (82948) T-Hgb A1C (in-house) (21308MV)  Problem # 3:  RENAL INSUFFICIENCY, CHRONIC (ICD-585.9) Will recheck his BMET today.  If creatinine is elevated may need to reassess the use of metformin.  Orders: T-Basic Metabolic Panel [phone])  Problem # 4:  DEPRESSION (ICD-311) Pt and wife currently deny any symptoms of depression.   His updated medication list for this problem includes:    Amitriptyline Hcl 50 Mg Tabs (Amitriptyline hcl) .Marland Kitchen... Take 1 tab by mouth at bedtime  Problem # 5:  CEREBROVASCULAR ACCIDENT, HX OF (ICD-V12.50) Stable, but wife is becoming concerned about her ability to care for the patient at home as he has begun wandering and refuses to listen to her.  I did council the patient today on the need to listen to his care takers as he could require care from and ALF or SNF if his wife is unable to properly care for him.  The patient agreed to try to improve his behaviors.   Problem # 6:  PREVENTIVE HEALTH CARE (ICD-V70.0) Pt recieved a flu shot today but did not require a pnumovax as he has had one within the last 3 years.  I requested that the patient bring in these records.  The patient was refered for both a colonoscopy and for ophthalmology referral.  Orders: Flu Vaccine 54yrs + (84132) Ophthalmology Referral (Ophthalmology) Gastroenterology Referral (GI)  Complete Medication List: 1)  Klor-con 20 Meq Pack (Potassium chloride) .... Take 1 tablet by mouth once a day 2)  Aggrenox 25-200 Mg Cp12 (Aspirin-dipyridamole) .... Take 1 tablet by mouth two times a day 3)  Lipitor 40 Mg Tabs (Atorvastatin calcium) .... Take 1 tablet by mouth once a day 4)  Folic Acid 1 Mg Tabs (Folic acid) .... Take 1 tablet by mouth once a day 5)  Lancets Misc (Lancets) .... To test blood glucose 2x/day 6)  Freestyle Test Strp (Glucose blood) 7)  Norvasc  10 Mg Tabs (Amlodipine besylate) .... Take 1 tablet by mouth once a day 8)  Cozaar 100 Mg Tabs (Losartan potassium) .... Take one  tablet by mouth once a day 9)  Omeprazole 40 Mg Cpdr (Omeprazole) .... Take 1 tablet by mouth once a day 10)  Amitriptyline Hcl 50 Mg Tabs (Amitriptyline hcl) .... Take 1 tab by mouth at bedtime 11)  Carvedilol 25 Mg Tabs (Carvedilol) .... Take 1 tablet by mouth two times a day 12)  Flector 1.3 % Ptch (Diclofenac epolamine) .... Apply patch in affected area two times a day 13)  Ultram 50 Mg Tabs (Tramadol hcl) .... Take 1 tab by mouth every 8 hours as needed for  pain 14)  Metformin Hcl 500 Mg Tabs (Metformin hcl) .... Take 1 tablet by mouth two times a day  Other Orders: Influenza Vaccine MCR (95284) Admin 1st Vaccine (13244)  Patient Instructions: 1)  Please schedule a follow-up appointment in 2 months. 2)  Please listen to your care givers to that you can continue to live independently at home.  3)  Continue to watch your diet to help control your diabetes and hypertension. Prescriptions: AMITRIPTYLINE HCL 50 MG  TABS (AMITRIPTYLINE HCL) Take 1 tab by mouth at bedtime  #31 x 6   Entered and Authorized by:   Sinda Du MD   Signed by:   Sinda Du MD on 12/04/2009   Method used:   Print then Give to Patient   RxID:   [id number] METFORMIN HCL 500 MG TABS (METFORMIN HCL) Take 1 tablet by mouth two times a day  #60 Tablet x 5   Entered and Authorized by:   Sinda Du MD   Signed by:   Sinda Du MD on 12/04/2009   Method used:   Print then Give to Patient   RxID:   [id number] OMEPRAZOLE 40 MG CPDR (OMEPRAZOLE) Take 1 tablet by mouth once a day  #30 x 3   Entered and Authorized by:   Sinda Du MD   Signed by:   Sinda Du MD on 12/04/2009   Method used:   Print then Give to Patient   RxID:   [id number] KLOR-CON 20 MEQ PACK (POTASSIUM CHLORIDE) Take 1 tablet by mouth once a day  #30 x 2   Entered and Authorized by:    Sinda Du MD   Signed by:   Sinda Du MD on 12/04/2009   Method used:   Print then Give to Patient   RxID:   [id number]    Orders Added: 1)  T- Capillary Blood Glucose [82948] 2)  T-Hgb A1C (in-house) [70623JS] 3)  T-Basic Metabolic Panel [[id number]] 4)  Est. Patient Level III [07371] 5)  Flu Vaccine 13yrs + [06269] 6)  Ophthalmology Referral [Ophthalmology] 7)  Gastroenterology Referral [GI] 8)  Influenza Vaccine MCR [00025] 9)  Admin 1st Vaccine [48546]   Immunizations Administered:  Influenza Vaccine # 1:    Vaccine Type: Fluvax MCR    Site: right deltoid    Mfr: GlaxoSmithKline    Dose: 0.5 ml    Route: IM    Given by: Chinita Pester RN    Exp. Date: 07/14/2010    Lot #: [id number]    VIS given: 08/08/09 version given December 04, 2009.  Flu Vaccine Consent Questions:    Do you have a history of severe allergic reactions to this vaccine? no    Any prior history of allergic reactions to egg and/or gelatin? no    Do you have a sensitivity to the preservative Thimersol? no    Do you have a past history of Guillan-Barre Syndrome? no    Do you currently have an acute febrile illness? no    Have you ever had a severe reaction to latex? no    Vaccine information given and explained to patient? yes   Immunizations Administered:  Influenza Vaccine # 1:    Vaccine Type: Fluvax MCR    Site: right deltoid    Mfr: GlaxoSmithKline    Dose: 0.5 ml    Route: IM    Given by: Chinita Pester RN    Exp. Date: 07/14/2010    Lot #: [id number]    VIS given: 08/08/09  version given December 04, 2009.  Flu Vaccine Consent Questions:    Do you have a history of severe allergic reactions to this vaccine? no    Any prior history of allergic reactions to egg and/or gelatin? no    Do you have a sensitivity to the preservative Thimersol? no    Do you have a past history of Guillan-Barre Syndrome? no    Do you currently have an acute febrile illness? no    Have you ever had  a severe reaction to latex? no    Vaccine information given and explained to patient? yes Process Orders Check Orders Results:     [facility]: Check successful Tests Sent for requisitioning (December 04, 2009 9:49 PM):     12/04/2009: [facility] -- T-Basic Metabolic Panel [id number] (signed)    Laboratory Results   Blood Tests   Date/Time Received: December 04, 2009 12:14 PM Date/Time Reported: Burke Keels  December 04, 2009 12:14 PM   HGBA1C: 6.2%   (Normal Range: Non-Diabetic - 3-6%   Control Diabetic - 6-8%) CBG Random:: 102mg /dL      Prevention & Chronic Care Immunizations   Influenza vaccine: Fluvax MCR  (12/04/2009)   Influenza vaccine deferral: Not available  (08/02/2009)    Tetanus booster: Not documented   Td booster deferral: Refused  (08/02/2009)    Pneumococcal vaccine: Not documented   Pneumococcal vaccine deferral: Deferred  (03/03/2009)  Colorectal Screening   Hemoccult: Not documented   Hemoccult action/deferral: Deferred  (11/17/2008)    Colonoscopy: Not documented   Colonoscopy action/deferral: GI referral  (11/17/2008)  Other Screening   PSA: Not documented   PSA action/deferral: Discussion deferred  (11/17/2008)   Smoking status: quit  (08/02/2009)  Diabetes Mellitus   HgbA1C: 6.2  (12/04/2009)    Eye exam: Not documented   Diabetic eye exam action/deferral: Ophthalmology referral  (11/17/2008)    Foot exam: yes  (08/02/2009)   Foot exam action/deferral: Do today   High risk foot: Yes  (08/02/2009)   Foot care education: Done  (08/02/2009)    Urine microalbumin/creatinine ratio: 24.3  (08/18/2008)   Urine microalbumin action/deferral: Ordered  Lipids   Total Cholesterol: 137  (03/03/2009)   Lipid panel action/deferral: Lipid Panel ordered   LDL: 66  (03/03/2009)   LDL Direct: Not documented   HDL: 41  (03/03/2009)   Triglycerides: 150  (03/03/2009)    SGOT (AST): 18  (03/03/2009)    BMP action: Ordered   SGPT (ALT): 18  (03/03/2009)   Alkaline phosphatase: 93  (03/03/2009)   Total bilirubin: 0.2  (03/03/2009)  Hypertension   Last Blood Pressure: 145 / 95  (12/04/2009)   Serum creatinine: 1.26  (08/02/2009)   BMP action: Ordered   Serum potassium 4.3  (08/02/2009)  Self-Management Support :   Personal Goals (by the next clinic visit) :     Personal A1C goal: 6  (03/03/2009)     Personal blood pressure goal: 130/80  (03/03/2009)     Personal LDL goal: 70  (03/03/2009)     Home glucose monitoring frequency: 2 times a day  (03/03/2009)    Diabetes self-management support: Education handout, Psychologist, forensic, Resources for patients handout, Written self-care plan  (08/02/2009)    Hypertension self-management support: Education handout, Psychologist, forensic, Resources for patients handout, Written self-care plan  (08/02/2009)    Lipid self-management support: Education handout, Psychologist, forensic, Resources for patients handout, Written self-care plan  (08/02/2009)

## 2010-02-13 NOTE — Progress Notes (Signed)
Summary: med refill/gp  Phone Note Refill Request Message from:  Fax from Pharmacy on January 19, 2009 2:05 PM  Refills Requested: Medication #1:  NEXIUM 40 MG  CPDR Take 1 tablet by mouth once a day   Last Refilled: 11/08/2008  Method Requested: Fax to Local Pharmacy Initial call taken by: Chinita Pester RN,  January 19, 2009 2:05 PM  Follow-up for Phone Call        Rx faxed to pharmacy Follow-up by: Mariea Stable MD,  January 20, 2009 1:41 PM    Prescriptions: NEXIUM 40 MG  CPDR (ESOMEPRAZOLE MAGNESIUM) Take 1 tablet by mouth once a day  #90 x 3   Entered and Authorized by:   Mariea Stable MD   Signed by:   Mariea Stable MD on 01/20/2009   Method used:   Faxed to ...       [facility] (retail)       [address] [place], [place]  [address]       Ph: [phone]       Fax: [phone]   RxID:   [id number]

## 2010-02-13 NOTE — Progress Notes (Signed)
Summary: Refill/gh  Phone Note Refill Request Message from:  Fax from Pharmacy on September 04, 2009 11:20 AM  Refills Requested: Medication #1:  K-TABS 20 MEQ TAB(POTASSIUM CHLORIDE) Take 1 tablet by mouth once a day   Last Refilled: 08/04/2009  Method Requested: Electronic Initial call taken by: Angelina Ok RN,  September 04, 2009 11:20 AM  Follow-up for Phone Call        Rx faxed to pharmacy.  Updated since could not refill with prior entry.  Not sure why patient requires potassium (no meds that waste potassium).  Please refer to prior office visit for issues with meds.  will need to readress next visit.  Follow-up by: Mariea Stable MD,  September 05, 2009 9:55 AM    New/Updated Medications: KLOR-CON 20 MEQ PACK (POTASSIUM CHLORIDE) Take 1 tablet by mouth once a day Prescriptions: KLOR-CON 20 MEQ PACK (POTASSIUM CHLORIDE) Take 1 tablet by mouth once a day  #30 x 2   Entered and Authorized by:   Mariea Stable MD   Signed by:   Mariea Stable MD on 09/05/2009   Method used:   [facility]        [facility] [address] [phone]* (retail)       [address]       [place], [place]  [address]       Ph: [phone] or [phone]       Fax: [phone]   RxID:   [id number]   Appended Document: Refill/gh Prescription for Klor-Con tablets called to the [facility] pt to take 1 tablet daily.  [facility] cancelled at the [facility]. Angelina Ok, RN September 05, 2009 3:30 PM

## 2010-02-13 NOTE — Progress Notes (Signed)
Summary: phone call/gp  Phone Note Outgoing Call   Summary of Call: I returned call to pt.'s wife about medication; message left/no one home. Initial call taken by: Chinita Pester RN,  May 19, 2009 5:06 PM

## 2010-02-13 NOTE — Progress Notes (Signed)
Summary: med refill/gp  Phone Note Refill Request Message from:  Patient' wife on May 22, 2009 9:47 AM  Refills Requested: Medication #1:  COZAAR 100 MG  TABS Take one  tablet by mouth once a day Pt.'s wife states the [facility] no longer have Cozaar and wants new Rx sent to [facility] [address]   Method Requested: Electronic Initial call taken by: Chinita Pester RN,  May 22, 2009 9:47 AM  Follow-up for Phone Call        Pt had #6 refills left on this rx so I called them [facility] Follow-up by: Merrie Roof RN,  May 23, 2009 3:36 PM

## 2010-02-13 NOTE — Progress Notes (Signed)
Summary: refill/ hla  Phone Note Refill Request Message from:  Fax from Pharmacy on March 16, 2009 3:30 PM  Refills Requested: Medication #1:  AMITRIPTYLINE HCL 50 MG  TABS Take 1 tab by mouth at bedtime   Last Refilled: 1/19 last visit and labs 2/18  Initial call taken by: Marin Roberts RN,  March 16, 2009 3:30 PM  Follow-up for Phone Call        Rx faxed to pharmacy Follow-up by: Mariea Stable MD,  March 22, 2009 10:55 AM    Prescriptions: AMITRIPTYLINE HCL 50 MG  TABS (AMITRIPTYLINE HCL) Take 1 tab by mouth at bedtime  #31 x 6   Entered and Authorized by:   Mariea Stable MD   Signed by:   Mariea Stable MD on 03/22/2009   Method used:   Faxed to ...       [facility] Department (retail)       [address] [place], [place]  [address]       Ph: [phone]       Fax: [phone]   RxID:   [id number]

## 2010-02-13 NOTE — Progress Notes (Signed)
  Phone Note Call from Patient   Caller: Spouse Reason for Call: Refill Medication Details of Action Taken: Prescription sent electronically. Summary of Call: Patient's wife call asking for cozaar and flector patch to been refilled. Prescription were sent electronically sent to patient's designated pharmacy on his medical record.   Follow-up for Phone Call        [facility] [address] [phone]* (retail) [address]; [place], [place]  [address]    Prescriptions: FLECTOR 1.3 % PTCH (DICLOFENAC EPOLAMINE) apply patch in affected area two times a day  #1 box x 1   Entered and Authorized by:   Vassie Loll MD   Signed by:   Vassie Loll MD on 09/23/2009   Method used:   [facility]ronically to        [facility] [address] [phone]* (retail)       [address]       [place], [place]  [address]       Ph: [phone] or [phone]       Fax: [phone]   RxID:   [id number] COZAAR 100 MG  TABS (LOSARTAN POTASSIUM) Take one  tablet by mouth once a day  #30 x 6   Entered and Authorized by:   Vassie Loll MD   Signed by:   Vassie Loll MD on 09/23/2009   Method used:   [facility]ronically to        [facility] [address] [phone]* (retail)       [address]       [place], [place]  [address]       Ph: [phone] or [phone]       Fax: [phone]   RxID:   [id number]

## 2010-02-15 ENCOUNTER — Other Ambulatory Visit: Payer: Self-pay | Admitting: *Deleted

## 2010-02-15 NOTE — Progress Notes (Signed)
  Phone Note Outgoing Call   Call placed by: Theotis Barrio NT II,  January 01, 2010 10:25 AM Call placed to: Specialist Details for Reason: Corinda Gubler [phone] Summary of Call: CALLED OFFICE TO MAKE REFERRAL FOR GI / UNALBE TO MAKE THIS REFERRAL , PATIENT NEEDS TO CALL THE LABAUER BILLING OFFICE BEFOR APPT CAN BE MADE.

## 2010-02-15 NOTE — Progress Notes (Signed)
  Phone Note Outgoing Call   Call placed by: Theotis Barrio NT II,  January 03, 2010 10:54 AM Call placed to: Patient Details for Reason: REFERRAL FOR [facility] Summary of Call: SPOKE WITH HIS WIFE, MR [place] IS TO CALL THE LABAUER BILLING OFFICE, BEFORE I CAN MAKE HIM APPT WITH THE GI OFFICE. WIFE IS TOCALL ME BACK AT THE [phone]

## 2010-02-15 NOTE — Progress Notes (Signed)
  Phone Note Outgoing Call   Call placed by: Theotis Barrio NT II,  February 06, 2010 4:06 PM Call placed to: Patient Details for Reason: REFERRAL - [facility] Summary of Call: SPOKE WITH WIFE (DOROTHY). GAVE HERE THE APPT INFO. /  CONSULT WITH DR Arlyce Dice  / MARCH 2, 012 @ 3:30PM (3:15PM)  / [address]. PATIENT IS AWARE OF THIS APPT. MRS Selley WAS INSTRUCTED TO CALL THE OFFICE SHE NEEDED TO CHANGE OR CANCELL THIS APPT. SHE UNDERSTOOD THESE INSTURCTIONS. Antar Milks NT II  February 06, 2010 4:11 PM

## 2010-02-15 NOTE — Progress Notes (Signed)
  Phone Note Outgoing Call   Call placed by: Theotis Barrio NT II,  February 06, 2010 3:17 PM Call placed to: Patient Details for Reason: REFERRAL GI Summary of Call: CALLED PATIENT, NO ANSWER.  PATIENT NEEDS TO SEE DEBORAH Staver BEFORE I CAN MAKE THIS GI REFERRAL  LAST SPOKE WITH WIFE , LEFT MESSAGE WITH HER TO HAVE [facility] TO CALL ME.

## 2010-02-19 MED ORDER — ATORVASTATIN CALCIUM 40 MG PO TABS: 40.0000 mg | ORAL_TABLET | Freq: Every day | ORAL | Status: DC

## 2010-02-20 NOTE — Telephone Encounter (Signed)
Rx faxed

## 2010-03-16 ENCOUNTER — Ambulatory Visit (INDEPENDENT_AMBULATORY_CARE_PROVIDER_SITE_OTHER): Payer: Medicare Other | Admitting: Gastroenterology

## 2010-03-16 ENCOUNTER — Other Ambulatory Visit: Payer: Self-pay | Admitting: Internal Medicine

## 2010-03-16 ENCOUNTER — Encounter: Payer: Self-pay | Admitting: Gastroenterology

## 2010-03-16 DIAGNOSIS — K5900 Constipation, unspecified: Secondary | ICD-10-CM

## 2010-03-20 ENCOUNTER — Other Ambulatory Visit: Payer: Self-pay | Admitting: *Deleted

## 2010-03-20 MED ORDER — FOLIC ACID 1 MG PO TABS: 1.0000 mg | ORAL_TABLET | Freq: Every day | ORAL | Status: DC

## 2010-03-22 NOTE — Letter (Signed)
Summary: [facility] Instructions  [facility]  [address] [place], [place] [address]   Phone: [phone]  Fax: [phone]       CLESTON LAUTNER    [birth year]    MRN: [id number]        Procedure Day /Date:TUESDAY 04/24/2010      Arrival Time:2PM     Procedure Time:3PM     Location of Procedure:                    X   [facility] (4th Floor)   PREPARATION FOR COLONOSCOPY WITH MOVIPREP   Starting 5 days prior to your procedure 04/19/2010  do not eat nuts, seeds, popcorn, corn, beans, peas,  salads, or any raw vegetables.  Do not take any fiber supplements (e.g. Metamucil, Citrucel, and Benefiber).  THE DAY BEFORE YOUR PROCEDURE         DATE:04/23/2010  DAY: MONDAY  1.  Drink clear liquids the entire day-NO SOLID FOOD  2.  Do not drink anything colored red or purple.  Avoid juices with pulp.  No orange juice.  3.  Drink at least 64 oz. (8 glasses) of fluid/clear liquids during the day to prevent dehydration and help the prep work efficiently.  CLEAR LIQUIDS INCLUDE: Water Jello Ice Popsicles Tea (sugar ok, no milk/cream) Powdered fruit flavored drinks Coffee (sugar ok, no milk/cream) Gatorade Juice: apple, white grape, white cranberry  Lemonade Clear bullion, consomm, broth Carbonated beverages (any kind) Strained chicken noodle soup Hard Candy                             4.  In the morning, mix first dose of MoviPrep solution:    Empty 1 Pouch A and 1 Pouch B into the disposable container    Add lukewarm drinking water to the top line of the container. Mix to dissolve    Refrigerate (mixed solution should be used within 24 hrs)  5.  Begin drinking the prep at 5:00 p.m. The MoviPrep container is divided by 4 marks.   Every 15 minutes drink the solution down to the next mark (approximately 8 oz) until the full liter is complete.   6.  Follow completed prep with 16 oz of clear liquid of your choice (Nothing red or purple).  Continue to  drink clear liquids until bedtime.  7.  Before going to bed, mix second dose of MoviPrep solution:    Empty 1 Pouch A and 1 Pouch B into the disposable container    Add lukewarm drinking water to the top line of the container. Mix to dissolve    Refrigerate  THE DAY OF YOUR PROCEDURE      DATE: 04/24/2010 DAY: TUESDAY Beginning at 10a.m. (5 hours before procedure):         1. Every 15 minutes, drink the solution down to the next mark (approx 8 oz) until the full liter is complete.  2. Follow completed prep with 16 oz. of clear liquid of your choice.    3. You may drink clear liquids until 1PM (2 HOURS BEFORE PROCEDURE).   MEDICATION INSTRUCTIONS  Unless otherwise instructed, you should take regular prescription medications with a small sip of water   as early as possible the morning of your procedure.  Diabetic patients - see separate instructions.  Stop taking Plavix or Aggrenox on  _  _  (7 days before procedure). WE WILL CONTACT  YOU ABOUT COMING OFF YOUR AGGRENOX, PLEASE CONTACT THE OFFICE IF YOU DO NOT HEAR FROM OUR OFFICE            OTHER INSTRUCTIONS  You will need a responsible adult at least 59 years of age to accompany you and drive you home.   This person must remain in the waiting room during your procedure.  Wear loose fitting clothing that is easily removed.  Leave jewelry and other valuables at home.  However, you may wish to bring a book to read or  an iPod/MP3 player to listen to music as you wait for your procedure to start.  Remove all body piercing jewelry and leave at home.  Total time from sign-in until discharge is approximately 2-3 hours.  You should go home directly after your procedure and rest.  You can resume normal activities the  day after your procedure.  The day of your procedure you should not:   Drive   Make legal decisions   Operate machinery   Drink alcohol   Return to work  You will receive specific instructions about  eating, activities and medications before you leave.    The above instructions have been reviewed and explained to me by   _______________________    I fully understand and can verbalize these instructions _____________________________ Date _________

## 2010-03-22 NOTE — Letter (Addendum)
Summary: Anticoagulation Modification Letter  [facility]  [address] [place], [place] [address]   Phone: [phone]  Fax: [phone]    March 16, 2010  Re:    [name] [name] Aldea DOB:    1950/02/19 MRN:    [id number]    Dear Ilda Mori  We have scheduled the above patient for an endoscopic procedure. Our records show that  he/she is on anticoagulation therapy. Please advise as to how long the patient may come off their therapy of Aggrenox  prior to the scheduled procedure(s) on 04/24/2010 .   Please fax back/or route the completed form to Robin at [phone]  Thank you for your help with this matter.  Sincerely,  Merri Ray CMA Duncan Dull)   Physician Recommendation:  Hold Aggrenox 7 days prior ________________  Hold Coumadin 5 days prior ____________  Other ______________________________

## 2010-03-22 NOTE — Letter (Addendum)
Summary: Results Letter  [facility]  [address] [place], [place] [address]   Phone: [phone]  Fax: [phone]        March 16, 2010 MRN: [id number]    [facility] [address] [place], [place]  [address]    Dear [name] [name],   It is my pleasure to have treated you recently as a new patient in my office.  I appreciate your confidence and the opportunity to participate in your care.  Since I do have a busy inpatient endoscopy schedule and office schedule, my office hours vary weekly.  I am, however, available for emergency calls every day through my office.  If I cannot promptly meet an urgent office appointment, another one of our gastroenterologists will be able to assist you.  My well-trained staff are prepared to help you at all times.  For emergencies after office hours, a physician from our gastroenterology section is always available through my 24-hour answering service.  While you are under my care, I encourage discussion of your questions and concerns, and I will be happy to return your calls as soon as I am available.  Once again, I welcome you as a new patient and I look forward to a happy and healthy relationship.   Sincerely,     Barbette Hair. Arlyce Dice, MD,FACG         Sincerely,  Louis Meckel MD  This letter has been electronically signed by your physician.  Appended Document: Results Letter letter mailed

## 2010-03-22 NOTE — Letter (Signed)
Summary: Diabetic Instructions  [facility]  [address] [place], [place] [address]   Phone: [phone]  Fax: [phone]    WAH SABIC 09/03/1950 MRN: [id number]   X    ORAL DIABETIC MEDICATION INSTRUCTIONS  The day before your procedure:   Take your diabetic pill as you do normally  The day of your procedure:   Do not take your diabetic pill    We will check your blood sugar levels during the admission process and again in Recovery before discharging you home  ________________________________________________________________________  _  _   INSULIN (LONG ACTING) MEDICATION INSTRUCTIONS (Lantus, NPH, 70/30, Humulin, Novolin-N)   The day before your procedure:   Take  your regular evening dose    The day of your procedure:   Do not take your morning dose    _  _   INSULIN (SHORT ACTING) MEDICATION INSTRUCTIONS (Regular, Humulog, Novolog)   The day before your procedure:   Do not take your evening dose   The day of your procedure:   Do not take your morning dose   _  _   INSULIN PUMP MEDICATION INSTRUCTIONS  We will contact the physician managing your diabetic care for written dosage instructions for the day before your procedure and the day of your procedure.  Once we have received the instructions, we will contact you.

## 2010-03-27 LAB — GLUCOSE, CAPILLARY: Glucose-Capillary: 102 mg/dL — ABNORMAL HIGH (ref 70–99)

## 2010-03-27 NOTE — Assessment & Plan Note (Addendum)
Summary: consult colon on BT    History of Present Illness Visit Type: Follow-up Consult Primary GI MD: Melvia Heaps MD [facility] Primary Provider: Sinda Du, MD Requesting Provider: Sinda Du, MD Chief Complaint: Patient here to discuss recall colonoscopy pt is on aggrenox. He denies any GI complaints.  History of Present Illness:    [name] is a 60 year old Afro-American male referred at the request of Dr. Cathey Endow for colonoscopy.   Except for mild constipation he has no GI complaints. Specifically, he is without change of bowel habits, abdominal pain, melena, or hematochezia. The patient has a history of a CVA and is on Aggrenox.   Patient has type 2 diabetes   GI Review of Systems      Denies abdominal pain, acid reflux, belching, bloating, chest pain, dysphagia with liquids, dysphagia with solids, heartburn, loss of appetite, nausea, vomiting, vomiting blood, weight loss, and  weight gain.        Denies anal fissure, black tarry stools, change in bowel habit, constipation, diarrhea, diverticulosis, fecal incontinence, heme positive stool, hemorrhoids, irritable bowel syndrome, jaundice, light color stool, liver problems, rectal bleeding, and  rectal pain.    Current Medications (verified): 1)  Klor-Con 20 Meq Pack (Potassium Chloride) .... Take 1 Tablet By Mouth Once A Day 2)  Aggrenox 25-200 Mg Cp12 (Aspirin-Dipyridamole) .... Take 1 Tablet By Mouth Two Times A Day 3)  Lipitor 40 Mg  Tabs (Atorvastatin Calcium) .... Take 1 Tablet By Mouth Once A Day 4)  Folic Acid 1 Mg Tabs (Folic Acid) .... Take 1 Tablet By Mouth Once A Day 5)  Lancets  Misc (Lancets) .... To Test Blood Glucose 2x/day 6)  Freestyle Test  Strp (Glucose Blood) 7)  Norvasc 10 Mg Tabs (Amlodipine Besylate) .... Take 1 Tablet By Mouth Once A Day 8)  Cozaar 100 Mg  Tabs (Losartan Potassium) .... Take One  Tablet By Mouth Once A Day 9)  Omeprazole 40 Mg Cpdr (Omeprazole) .... Take 1 Tablet By Mouth Once A Day 10)   Amitriptyline Hcl 50 Mg  Tabs (Amitriptyline Hcl) .... Take 1 Tab By Mouth At Bedtime 11)  Metformin Hcl 500 Mg Tabs (Metformin Hcl) .... Take 1 Tablet By Mouth Two Times A Day 12)  Mucinex Dm 30-600 Mg Xr12h-Tab (Dextromethorphan-Guaifenesin) .... Take One By Mouth Once Daily  Allergies (verified): 1)  ! Depakote (Divalproex Sodium)  Past History:  Past Medical History: Reviewed history from 11/17/2007 and no changes required. s/p 3 strokes (04, 06 and 07/2005) did not see a doctor before the strokes Diabetes mellitus, type II Hypertension Weakness in his legs and arms, but worse in his legs Depression  Past Surgical History: Unremarkable  Family History: Reviewed history from 12/21/2007 and no changes required. Family History of Diabetes: maternal grandmother No FH of Colon Cancer:  Social History: Reviewed history from 08/17/2008 and no changes required. Married. His wife cares for him and she works for [employer]. Used to be a Music therapist, but is now disabled Patient is a former smoker (quit smoking and drinking after 1st stroke).  Alcohol Use - no  Review of Systems       The patient complains of confusion.  The patient denies allergy/sinus, anemia, anxiety-new, arthritis/joint pain, back pain, blood in urine, breast changes/lumps, change in vision, cough, coughing up blood, depression-new, fainting, fatigue, fever, headaches-new, hearing problems, heart murmur, heart rhythm changes, itching, muscle pains/cramps, night sweats, nosebleeds, shortness of breath, skin rash, sleeping problems, sore throat, swelling of feet/legs,  swollen lymph glands, thirst - excessive, urination - excessive, urination changes/pain, urine leakage, vision changes, and voice change.         All other systems were reviewed and were negative   Vital Signs:  Patient profile:   60 year old male Height:      68 inches Weight:      241.2 pounds BMI:     36.81 Pulse rate:   88  / minute Pulse rhythm:   regular BP sitting:   120 / 66  (left arm) Cuff size:   regular  Vitals Entered By: Harlow Mares CMA Duncan Dull) (March 16, 2010 4:26 PM)  Physical Exam  Additional Exam:  Physical Exam: General:   WDWN HEENT:   anicteric.  No pharyngeal abnormalities Neck:   No masses, thyroidmegaly Nodes:   No cervical, axillary, inguinal adenopathy Chest:    Clear to auscultation Cardiac:   No murmurs, gallops, rubs Abdomen:   BS active.  No abd masses, tenderness, organomegaly Rectal:   Deferred Extremities:   No cyanosis, clubbing, edema Skeletal:   No deformities Neuro:   Alert, oriented x3.  No focal abnormalities     Impression & Recommendations:  Problem # 1:  SPECIAL SCREENING FOR MALIGNANT NEOPLASMS COLON (ICD-V76.51)   Plan screening colonoscopy. Aggrenox will be held in advance of the procedure provided this is approved by his primary care physician  Orders: Colonoscopy (Colon)  Problem # 2:  CEREBROVASCULAR ACCIDENT, HX OF (ICD-V12.50) Assessment: Comment Only  Problem # 3:  DIABETES MELLITUS, TYPE II (ICD-250.00) Assessment: Comment Only  Patient Instructions: 1)  Copy sent to : Sinda Du, M 2)  Your Colonoscopy is scheduled on 04/24/2010 at 3pm 3)  We will send in your MoviPrep to your pharmacy today 4)  We will contact you about your Aggrenox once we hear from your prescriber about holding the medication prior to your procedure 5)  The medication list was reviewed and reconciled.  All changed / newly prescribed medications were explained.  A complete medication list was provided to the patient / caregiver.

## 2010-04-05 ENCOUNTER — Other Ambulatory Visit (INDEPENDENT_AMBULATORY_CARE_PROVIDER_SITE_OTHER): Payer: Medicare Other | Admitting: Internal Medicine

## 2010-04-05 ENCOUNTER — Encounter: Payer: Self-pay | Admitting: Internal Medicine

## 2010-04-05 ENCOUNTER — Other Ambulatory Visit (INDEPENDENT_AMBULATORY_CARE_PROVIDER_SITE_OTHER): Payer: Medicare Other | Admitting: Ophthalmology

## 2010-04-05 DIAGNOSIS — Z8679 Personal history of other diseases of the circulatory system: Secondary | ICD-10-CM

## 2010-04-09 ENCOUNTER — Encounter: Payer: Self-pay | Admitting: *Deleted

## 2010-04-12 NOTE — Letter (Addendum)
Summary: Anticoagulation Modification Letter  Dear [name]  [name] is on Aggrenox for history of 3 strokes (04, 06 and 07/2005).  With that history, I would prefer to continue anti-platelet medications as much as possible.  Since this is a screening colonoscopy, I do not think we need to stop anti-platelet medications with the assumption that no polypectomy/biopsies will be done.  In order to avoid the possible need to 2 procedures, I would be ok with stopping the aggrenox for 5 days prior to the colonoscopy as the half life is approximately 17 hours for dipyridamole.  It should be restarted as soon as possible depending on whether or not a polypectomy is performed.    Thank you for your assistance with the care of [name].  Sincerely,

## 2010-04-13 ENCOUNTER — Telehealth: Payer: Self-pay | Admitting: *Deleted

## 2010-04-16 NOTE — Telephone Encounter (Signed)
Tried to call pt to inform that it is ok to hold aggrenox. Could not leave a message on pts phone. Answering machine would not let me leave a message. It kept cutting me off

## 2010-04-18 ENCOUNTER — Telehealth: Payer: Self-pay | Admitting: *Deleted

## 2010-04-18 NOTE — Telephone Encounter (Signed)
L/M for pt that he needs to hold his aggrenox 5 days prior to procedure which is scheduled on the 12th. Received letter from [employer]. Sent down to be scanned in

## 2010-04-24 ENCOUNTER — Other Ambulatory Visit: Payer: Medicare Other | Admitting: Gastroenterology

## 2010-04-26 ENCOUNTER — Other Ambulatory Visit (INDEPENDENT_AMBULATORY_CARE_PROVIDER_SITE_OTHER): Payer: Medicare Other | Admitting: Internal Medicine

## 2010-04-26 ENCOUNTER — Telehealth: Payer: Self-pay | Admitting: *Deleted

## 2010-04-26 ENCOUNTER — Other Ambulatory Visit: Payer: Self-pay | Admitting: Internal Medicine

## 2010-04-26 DIAGNOSIS — R05 Cough: Secondary | ICD-10-CM

## 2010-04-26 MED ORDER — GUAIFENESIN-CODEINE 100-10 MG/5ML PO SYRP: 5.0000 mL | ORAL_SOLUTION | Freq: Three times a day (TID) | ORAL | Status: AC | PRN

## 2010-04-26 NOTE — Telephone Encounter (Addendum)
Pt had been on CHERATUSSIN AC 100-10 MG/5ML SYRP last time he had a cough and it worked well.  I have scheduled appointment for next week at there request. Wife states pt doing okay other than the cough, which tires him out.  Review of his [facility] chart confirms the Cheratussin AC 100-10 mg/5 ml syrup 1 teaspoon PO Q6H PRN  Cough.  Agree with appointment to assess [name] [name] to make sure the cough is not secondary to a severe process.  In the meantime, will treat symptomatically with Cheratussin.  Please call the ordered prescription into the pharmacy.  Thank You.

## 2010-04-26 NOTE — Telephone Encounter (Signed)
Pt's wife called back on 12/18/09 stating pt had a non-productive cough and no fever.  She had apparently been given a script for cheratussin and requested a script for him.  This was done and pt advised to come to clinic if any worsening.  I think if there is an issue with cough again, he should be evaluated for the etiology of the cough.  Please schedule an appointment in the next few days if there are slots open for someone to evaluate his cough.  Thanks,

## 2010-04-26 NOTE — Telephone Encounter (Signed)
Will refill folic acid X 1 but ask PCP to confirm that it needs to be continued.

## 2010-04-26 NOTE — Telephone Encounter (Signed)
Pt wife called stating pt has been sleeping a lot and has cough.    Onset 2 days ago. Hx: CVA  Pt is taking mucinex but she is asking for Rx for cough med.

## 2010-04-26 NOTE — Telephone Encounter (Signed)
Pt has been scheduled for visit next week.  Will call if any changes in condition. Now only complaint is cough. Refill of cough med called in per Dr Josem Kaufmann and he agrees with OV for evaluation.

## 2010-04-30 ENCOUNTER — Encounter: Payer: Self-pay | Admitting: Internal Medicine

## 2010-04-30 ENCOUNTER — Ambulatory Visit (INDEPENDENT_AMBULATORY_CARE_PROVIDER_SITE_OTHER): Payer: Medicare Other | Admitting: Internal Medicine

## 2010-04-30 DIAGNOSIS — R05 Cough: Secondary | ICD-10-CM

## 2010-04-30 DIAGNOSIS — R059 Cough, unspecified: Secondary | ICD-10-CM

## 2010-04-30 DIAGNOSIS — R109 Unspecified abdominal pain: Secondary | ICD-10-CM

## 2010-04-30 DIAGNOSIS — M546 Pain in thoracic spine: Secondary | ICD-10-CM

## 2010-04-30 DIAGNOSIS — E119 Type 2 diabetes mellitus without complications: Secondary | ICD-10-CM

## 2010-04-30 DIAGNOSIS — J069 Acute upper respiratory infection, unspecified: Secondary | ICD-10-CM

## 2010-04-30 LAB — URINALYSIS, ROUTINE W REFLEX MICROSCOPIC
Bilirubin Urine: NEGATIVE
Glucose, UA: NEGATIVE mg/dL
Hgb urine dipstick: NEGATIVE
Leukocytes, UA: NEGATIVE
Protein, ur: NEGATIVE mg/dL
pH: 6 (ref 5.0–8.0)

## 2010-04-30 LAB — CBC WITH DIFFERENTIAL/PLATELET
Eosinophils Relative: 1 % (ref 0–5)
HCT: 38 % — ABNORMAL LOW (ref 39.0–52.0)
Hemoglobin: 12.2 g/dL — ABNORMAL LOW (ref 13.0–17.0)
Lymphocytes Relative: 41 % (ref 12–46)
MCHC: 32.1 g/dL (ref 30.0–36.0)
MCV: 77.4 fL — ABNORMAL LOW (ref 78.0–100.0)
Monocytes Absolute: 0.6 10*3/uL (ref 0.1–1.0)
Monocytes Relative: 13 % — ABNORMAL HIGH (ref 3–12)
Neutro Abs: 2.1 10*3/uL (ref 1.7–7.7)
RDW: 16.4 % — ABNORMAL HIGH (ref 11.5–15.5)
WBC: 4.7 10*3/uL (ref 4.0–10.5)

## 2010-04-30 LAB — COMPREHENSIVE METABOLIC PANEL
ALT: 22 U/L (ref 0–53)
Alkaline Phosphatase: 71 U/L (ref 39–117)
Creat: 1.7 mg/dL — ABNORMAL HIGH (ref 0.40–1.50)
Sodium: 142 mEq/L (ref 135–145)
Total Bilirubin: 0.4 mg/dL (ref 0.3–1.2)
Total Protein: 7.5 g/dL (ref 6.0–8.3)

## 2010-04-30 LAB — GLUCOSE, CAPILLARY: Glucose-Capillary: 146 mg/dL — ABNORMAL HIGH (ref 70–99)

## 2010-04-30 LAB — POCT GLYCOSYLATED HEMOGLOBIN (HGB A1C): Hemoglobin A1C: 6.3

## 2010-04-30 MED ORDER — KETOROLAC TROMETHAMINE 30 MG/ML IJ SOLN
30.0000 mg | Freq: Once | INTRAMUSCULAR | Status: AC
  Administered 2010-04-30: 30 mg via INTRAMUSCULAR

## 2010-04-30 MED ORDER — KETOROLAC TROMETHAMINE 30 MG/ML IM SOLN: 30.0000 mg | INTRAMUSCULAR | Status: DC

## 2010-04-30 NOTE — Patient Instructions (Addendum)
Upper Respiratory Infection (URI), Adult You have an upper respiratory infection (URI). This is also known as the common cold. The upper respiratory tract includes the nose, sinuses, throat, trachea, and bronchi (airways leading to the lungs).  CAUSE Many different viruses can infect the tissues lining the upper respiratory tract. The tissues become irritated and inflamed and often become very moist. A cold is contagious. You can easily spread the virus to others by oral contact. This includes kissing, sharing a glass, and coughing or sneezing. It can also be spread by touching your mouth or nose and then touching a surface which is then touched by another person. Symptoms typically develop one to three days after you come in contact with a cold virus. SYMPTOMS Symptoms vary from person to person. They may include runny nose, sneezing, nasal congestion, sinus irritation, sore throat, laryngitis, or cough. Fatigue, muscle aches, headache, and low grade fever may also occur. DIAGNOSIS You may diagnose your own upper respiratory infection based on familiar symptoms, since most people get a cold 2-3 times a year. Your caregiver can confirm this based on your examination. Most importantly, your caregiver can check that your symptoms are not due to another disease such as strep throat, sinusitis, pneumonia, asthma, epiglottis, or others. Blood tests, throat tests, and x-rays are not necessary to diagnose an upper respiratory infection.  PREVENTION The best way to protect against getting a cold is to practice good hygiene. Avoid oral or hand contact with people with cold symptoms. Wash hands often if contact occurs. There is no clear evidence that vitamin C, vitamin E, Echinacea, or exercise reduces the chance of developing an upper respiratory infection. PROGNOSIS An upper respiratory infection is benign. Most people improve within a week, but symptoms can last two weeks. A residual cough may last a week  longer. Complications can develop. RISKS AND COMPLICATIONS You may be at risk for a more severe case of the common cold if you smoke cigarettes, have chronic heart or lung disease, or if you have a weakened immune system. Bacterial sinusitis, middle ear infections, and bacterial pneumonia can complicate the common cold. The common cold can worsen asthma and COPD (chronic obstructive pulmonary disease). Sometimes these complications can be life threatening. TREATMENT Treatment is directed at relieving symptoms. There is no cure. Antibiotics are not effective. In fact, improper use of antibiotics can lead to the development of drug resistant bacteria. Non-medical treatment is safe and may be helpful:  Increased fluid intake.   Inhale heated mist or steam (vaporizer or shower).   Sip chicken soup.   Get plenty of rest.   Use gargles or lozenges for comfort.  Zinc gel and zinc lozenges, taken in the first 24 hours of the common cold, can shorten the duration and lessen the severity of symptoms. Pain medications may help fever, muscle aches, and throat pain. A variety of non-prescription medications are available to treat congestion and runny nose. Your caregiver can make recommendations and may suggest nasal or lung inhalers for other symptoms. Once again, antibiotics are not effective for upper respiratory infections.  HOME CARE INSTRUCTIONS  Only take over-the-counter or prescription medicines for pain, discomfort, or fever as directed by your caregiver.   Use a warm mist humidifier or inhale steam from a shower to increase air moisture. This may keep secretions moist and make it easier to breathe.   Drink plenty of clear liquids. You are drinking enough fluids if the urine remains a light yellow color rather than a  dark color.   Rest as needed.   Return to work when your temperature has returned to normal or as your caregiver advises. You may need to stay home longer to avoid infecting  others. You can also use a face mask and careful hand washing to prevent spread of virus.  SEEK MEDICAL CARE IF:  After the first few days, you feel you are getting worse rather than better.   You need your caregiver's advice about medications to control symptoms.   You develop symptoms that you think suggest complications such as pneumonia, sinusitis, or strep throat.  SEEK IMMEDIATE MEDICAL CARE IF:  You develop an oral temperature above 155f or if the fever lasts more than 2 days.   You develop severe or persistent headache, ear pain, sinus pain, or chest pain.   You develop a prolonged cough, cough up blood, have a change in your usual mucus (if you have chronic lung disease), or develop wheezing.   You develop sore muscles, stiff neck, or severe headache not controlled with medications.  Document Released: 06/26/2000 Document Re-Released: 10/10/2007 [facility] Patient Information 2011 [place], [place].   Abdominal Pain Abdominal pain can be caused by many things. Your caregiver decides the seriousness of your pain by an examination and possibly blood tests and X-rays. Many cases can be observed and treated at home. Most abdominal pain is not caused by a disease and will probably improve without treatment. However, in many cases, more time must pass before a clear cause of the pain can be found. Before that point, it may not be known if you need more testing, or if hospitalization or surgery is needed. HOME CARE INSTRUCTIONS  Do not take laxatives unless directed by your caregiver.   Take pain medicine only as directed by your caregiver.   Only take over-the-counter or prescription medicines for pain, discomfort, or fever as directed by your caregiver.   Try a clear liquid diet (broth, tea, or water) for 3 days or as ordered by your caregiver. Slowly move to a bland diet as tolerated.  SEEK IMMEDIATE MEDICAL CARE IF:  The pain does not go away.   You or your child has an oral  temperature above 101F, not controlled by medicine.   You keep throwing up (vomiting).   The pain is felt only in portions of the abdomen. Pain in the right side could possibly be appendicitis. In an adult, pain in the left lower portion of the abdomen could be colitis or diverticulitis.   You pass bloody or black tarry stools.  MAKE SURE YOU:  Understand these instructions.   Will watch your condition.   Will get help right away if you are not doing well or get worse.  Document Released: 10/10/2004 Document Re-Released: 03/27/2009 [facility] Patient Information 2011 [place], [place].   Follow up in 3 days or earlier if worse. Drink a lot of fluids. Tylenol 500mg  3 times a day for next 3 days.

## 2010-04-30 NOTE — Assessment & Plan Note (Deleted)
Patient has had dry cough mentioned in his past medical history but does cough has been productive with slimy mucous and has been present since last few days. No fever, chills or shortness of breath noted. Patient started taking Robitussin on April 12 and has been feeling a lot better. I advised to drink plenty of fluids, steam inhalations and Tylenol 3 times a day for next 3 days. He is instructed to call back or sit up an appointment if he feels worse. 

## 2010-04-30 NOTE — Assessment & Plan Note (Signed)
Patient has had dry cough mentioned in his past medical history but does cough has been productive with slimy mucous and has been present since last few days. No fever, chills or shortness of breath noted. Patient started taking Robitussin on April 12 and has been feeling a lot better. I advised to drink plenty of fluids, steam inhalations and Tylenol 3 times a day for next 3 days. He is instructed to call back or sit up an appointment if he feels worse.

## 2010-04-30 NOTE — Progress Notes (Signed)
  Subjective:    Patient ID: [name] [name], male    DOB: [birth year], 60 y.o.   MRN: [id number]  HPI [name] [name] is a 60 year old man with past medical history of diabetes, hypertension, hyperlipidemia and depression. Patient had set this appointment up for evaluation of his cough but he states that his cough is now better. Patient started taking Robitussin.  Patient complains of acute onset right flank pain which started 3 hours ago, pain is nonradiating, sharp , 8/10 at its worse, has been worsening over the last 3 hours, not associated with nausea vomiting, no change in urinary habits or no change in bowel habits. There are no exacerbating or relieving factors. Patient has never had abdominal pain in the past. No other complaints.  Blood pressure is 122/81 which is normotensive.  Patient's wife tells me that patient has been feeling worse for last 2 weeks. Feels sleepy throughout the day, has not been eating well. I see that sleepiness has been mentioned as one of his problems in the past medical history.    Review of Systems  Constitutional: Negative for fever, activity change and appetite change.  HENT: Negative for sore throat.   Respiratory: Negative for cough and shortness of breath.   Cardiovascular: Negative for chest pain and leg swelling.  Gastrointestinal: Positive for abdominal pain. Negative for nausea, diarrhea, constipation and abdominal distention.  Genitourinary: Negative for frequency, hematuria and difficulty urinating.  Neurological: Negative for dizziness and headaches.  Psychiatric/Behavioral: Negative for suicidal ideas and behavioral problems.       Objective:   Physical Exam  Constitutional: He is oriented to person, place, and time. He appears well-developed and well-nourished.  HENT:  Head: Normocephalic and atraumatic.  Eyes: Conjunctivae and EOM are normal. Pupils are equal, round, and reactive to light. No scleral icterus.  Neck: Normal range of motion. Neck  supple. No JVD present. No thyromegaly present.  Cardiovascular: Normal rate, regular rhythm, normal heart sounds and intact distal pulses.  Exam reveals no gallop and no friction rub.   No murmur heard. Pulmonary/Chest: Effort normal and breath sounds normal. No respiratory distress. He has no wheezes. He has no rales.  Abdominal: Soft. Bowel sounds are normal. He exhibits no distension and no mass. There is no tenderness. There is no rebound and no guarding.  Musculoskeletal: Normal range of motion. He exhibits no edema and no tenderness.  Lymphadenopathy:    He has no cervical adenopathy.  Neurological: He is alert and oriented to person, place, and time.  Psychiatric: He has a normal mood and affect. His behavior is normal.          Assessment & Plan:

## 2010-05-01 LAB — POCT CARDIAC MARKERS
CKMB, poc: 1.1 ng/mL (ref 1.0–8.0)
CKMB, poc: <1 ng/mL — ABNORMAL LOW (ref 1.0–8.0)
Myoglobin, poc: 171 ng/mL (ref 12–200)
Troponin i, poc: <0.05 ng/mL (ref 0.00–0.09)
Troponin i, poc: <0.05 ng/mL (ref 0.00–0.09)

## 2010-05-01 LAB — LIPID PANEL
LDL Cholesterol: 58 mg/dL (ref 0–99)
Total CHOL/HDL Ratio: 3.7 RATIO
Triglycerides: 129 mg/dL (ref <150)
VLDL: 26 mg/dL (ref 0–40)

## 2010-05-01 LAB — POCT I-STAT, CHEM 8
BUN: 12 mg/dL (ref 6–23)
Calcium, Ion: 0.96 mmol/L — ABNORMAL LOW (ref 1.12–1.32)
Chloride: 110 mEq/L (ref 96–112)
Creatinine, Ser: 1.4 mg/dL (ref 0.4–1.5)
Glucose, Bld: 96 mg/dL (ref 70–99)
Potassium: 3.8 mEq/L (ref 3.5–5.1)

## 2010-05-01 LAB — FOLATE: Folate: >20 ng/mL

## 2010-05-01 LAB — RETICULOCYTES
RBC.: 4.32 MIL/uL (ref 4.22–5.81)
Retic Count, Absolute: 69.1 10*3/uL (ref 19.0–186.0)
Retic Ct Pct: 1.6 % (ref 0.4–3.1)

## 2010-05-01 LAB — MAGNESIUM: Magnesium: 2.2 mg/dL (ref 1.5–2.5)

## 2010-05-01 LAB — GLUCOSE, CAPILLARY
Glucose-Capillary: 103 mg/dL — ABNORMAL HIGH (ref 70–99)
Glucose-Capillary: 105 mg/dL — ABNORMAL HIGH (ref 70–99)
Glucose-Capillary: 107 mg/dL — ABNORMAL HIGH (ref 70–99)
Glucose-Capillary: 111 mg/dL — ABNORMAL HIGH (ref 70–99)
Glucose-Capillary: 126 mg/dL — ABNORMAL HIGH (ref 70–99)
Glucose-Capillary: 157 mg/dL — ABNORMAL HIGH (ref 70–99)

## 2010-05-01 LAB — CARDIAC PANEL(CRET KIN+CKTOT+MB+TROPI)
CK, MB: 2.1 ng/mL (ref 0.3–4.0)
Relative Index: 0.5 (ref 0.0–2.5)
Total CK: 426 U/L — ABNORMAL HIGH (ref 7–232)
Troponin I: 0.01 ng/mL (ref 0.00–0.06)
Troponin I: 0.01 ng/mL (ref 0.00–0.06)

## 2010-05-01 LAB — COMPREHENSIVE METABOLIC PANEL
AST: 27 U/L (ref 0–37)
Albumin: 3.8 g/dL (ref 3.5–5.2)
Alkaline Phosphatase: 81 U/L (ref 39–117)
BUN: 11 mg/dL (ref 6–23)
CO2: 26 mEq/L (ref 19–32)
Chloride: 108 mEq/L (ref 96–112)
GFR calc non Af Amer: 57 mL/min — ABNORMAL LOW (ref >60)
Potassium: 3.2 mEq/L — ABNORMAL LOW (ref 3.5–5.1)
Total Bilirubin: 0.5 mg/dL (ref 0.3–1.2)

## 2010-05-01 LAB — CBC
HCT: 33.2 % — ABNORMAL LOW (ref 39.0–52.0)
HCT: 33.6 % — ABNORMAL LOW (ref 39.0–52.0)
Hemoglobin: 10.8 g/dL — ABNORMAL LOW (ref 13.0–17.0)
Hemoglobin: 10.8 g/dL — ABNORMAL LOW (ref 13.0–17.0)
Hemoglobin: 11.1 g/dL — ABNORMAL LOW (ref 13.0–17.0)
MCHC: 32.7 g/dL (ref 30.0–36.0)
MCV: 81.5 fL (ref 78.0–100.0)
MCV: 81.7 fL (ref 78.0–100.0)
MCV: 82.4 fL (ref 78.0–100.0)
Platelets: 193 10*3/uL (ref 150–400)
Platelets: 196 10*3/uL (ref 150–400)
RBC: 4.06 MIL/uL — ABNORMAL LOW (ref 4.22–5.81)
RBC: 4.11 MIL/uL — ABNORMAL LOW (ref 4.22–5.81)
RBC: 4.17 MIL/uL — ABNORMAL LOW (ref 4.22–5.81)
RDW: 18.8 % — ABNORMAL HIGH (ref 11.5–15.5)
WBC: 6 10*3/uL (ref 4.0–10.5)
WBC: 6.8 10*3/uL (ref 4.0–10.5)
WBC: 7.4 10*3/uL (ref 4.0–10.5)

## 2010-05-01 LAB — BASIC METABOLIC PANEL
BUN: 10 mg/dL (ref 6–23)
BUN: 13 mg/dL (ref 6–23)
Chloride: 103 mEq/L (ref 96–112)
Chloride: 105 mEq/L (ref 96–112)
Creatinine, Ser: 1.16 mg/dL (ref 0.4–1.5)
GFR calc non Af Amer: >60 mL/min (ref >60)
Potassium: 4 mEq/L (ref 3.5–5.1)

## 2010-05-01 LAB — DIFFERENTIAL
Basophils Absolute: 0 10*3/uL (ref 0.0–0.1)
Basophils Absolute: 0 10*3/uL (ref 0.0–0.1)
Basophils Relative: 1 % (ref 0–1)
Basophils Relative: 1 % (ref 0–1)
Eosinophils Absolute: 0.1 10*3/uL (ref 0.0–0.7)
Eosinophils Relative: 1 % (ref 0–5)
Eosinophils Relative: 1 % (ref 0–5)
Monocytes Absolute: 0.7 10*3/uL (ref 0.1–1.0)
Monocytes Absolute: 0.9 10*3/uL (ref 0.1–1.0)
Monocytes Relative: 14 % — ABNORMAL HIGH (ref 3–12)
Neutro Abs: 3 10*3/uL (ref 1.7–7.7)

## 2010-05-01 LAB — VITAMIN B12: Vitamin B-12: 535 pg/mL (ref 211–911)

## 2010-05-01 NOTE — Assessment & Plan Note (Addendum)
After reviewing the CBC, ua and cmet it was felt safe for him to go home. The patient was advised that he should come back to the emergency department or call appointment in the clinic as soon as possible if he feels worse. Patient was given 30 mg of IM Toradol which relieved his pain. Patient is also advised to come back for a repeat bmet check up in 2 weeks as his creatinine is up from baseline.

## 2010-05-06 ENCOUNTER — Other Ambulatory Visit: Payer: Self-pay | Admitting: Internal Medicine

## 2010-05-09 ENCOUNTER — Encounter: Payer: Self-pay | Admitting: Internal Medicine

## 2010-05-16 ENCOUNTER — Encounter: Payer: Self-pay | Admitting: Gastroenterology

## 2010-05-16 ENCOUNTER — Other Ambulatory Visit (INDEPENDENT_AMBULATORY_CARE_PROVIDER_SITE_OTHER): Payer: Medicare Other | Admitting: Internal Medicine

## 2010-05-16 ENCOUNTER — Telehealth: Payer: Self-pay | Admitting: Gastroenterology

## 2010-05-16 DIAGNOSIS — R079 Chest pain, unspecified: Secondary | ICD-10-CM

## 2010-05-16 DIAGNOSIS — F329 Major depressive disorder, single episode, unspecified: Secondary | ICD-10-CM

## 2010-05-16 NOTE — Telephone Encounter (Signed)
Called and left a message for patient to call me. Answering machine message was all in Spanish.

## 2010-05-16 NOTE — Telephone Encounter (Signed)
Patient should be charged cancellations. He

## 2010-05-16 NOTE — Telephone Encounter (Signed)
Spoke with patient's wife and she states he has not been off Aggrenox and he has been eating today. Cancelled colonoscopy for tomorrow. She will call back on 05/17/10 to reschedule patient. Told her he MUST come for a pre visit also.

## 2010-05-17 ENCOUNTER — Other Ambulatory Visit: Payer: Medicare Other | Admitting: Gastroenterology

## 2010-05-24 NOTE — Telephone Encounter (Signed)
Left message for pt to call back and schedule colon.

## 2010-05-28 NOTE — Telephone Encounter (Signed)
Spoke with pts wife and tried to reschedule [name] [name]'s colon. Wife states that she will call me back to reschedule the procedure.

## 2010-05-29 NOTE — Consult Note (Signed)
[name], [name]                 ACCOUNT NO.:  [id number]   MEDICAL RECORD NO.:  [id number]          PATIENT TYPE:  INP   LOCATION:  4731                         FACILITY:  [facility]   PHYSICIAN:  Vonna Kotyk R. Jacinto Halim, MD       DATE OF BIRTH:  May 16, 1950   DATE OF CONSULTATION:  02/26/2008  DATE OF DISCHARGE:                                 CONSULTATION   CHIEF COMPLAINT:  Chest pain.   Mr. Kirchman is a 60 year old male with a history of recurrent strokes.  He  initially had a right parietal CVA in April 2007.  The etiology is not  clear, Dr. Pearlean Brownie mentioned it may be secondary to small vessel disease.  He has had subsequent strokes in June 2007 and again in September 2007.  Echocardiogram in April 2007 showed good LV function.  The patient was  seen in consult once in the hospital in June 2007 by Dr. Amil Amen, but he  has never seen the patient in follow-up and the patient never had any  functional studies done.  Those records are not available at the time of  this dictation.  The patient is admitted February 26, 2008 with chest  pain.  Apparently this was in the setting of emotional stress.  He  describes left-sided weight that went through to his back.  He said it  lasted about 20 minutes.  When he came to the emergency room he  apparently received some nitroglycerin which he says helped.  His  history is somewhat questionable as he does have some dysarthria and  expressive aphasia from his strokes.  Currently he is painfree.  His  enzymes are negative so far.  His EKG shows some T-wave inversion in I,  AVL and V5 and V6 which was actually present and more prominent back in  May 2008.  He denies any history of exertional chest pain or previous  symptoms similar to his admission symptoms.   PAST MEDICAL HISTORY:  Remarkable for treated hypertension.  He has type  2, non-insulin-dependent diabetes.  He has anxiety and has had previous  admissions for some labile moods and anger issues.  He had  transient  renal insufficiency felt to be secondary to medications in September  2007 when his creatinine went to 3.  He apparently has had no major  surgeries in the past.   His current medications are Elavil 50 mg a day, Aggrenox b.i.d.,  labetalol 300 mg b.i.d. Cozaar 100 mg a day, Protonix 40 mg a day,  Crestor 20 mg a day.   ALLERGIES:  He has no known drug allergies.   SOCIAL HISTORY:  He is married.  He is a nonsmoker but he previously  smoked.  He lives at home with his wife and son.  His son is in his  26's.  The patient was unable to give me any family history.   REVIEW OF SYSTEMS:  Essentially unremarkable except for as noted above.  Please see admission history and physical for complete details.  In  reviewing his e-chart records he apparently had lower extremity  Dopplers  in May 2009 that suggested left SFA stenosis and possible bilateral  aortal iliac disease.  He does have past history of a right middle  cerebral artery stenosis per Dr. Pearlean Brownie.   PHYSICAL EXAM:  VITAL SIGNS:  Blood pressure 134/88, pulse 72,  temperature 98.1, respirations 12.  GENERAL:  He is a well-developed, obese African American male in no  acute distress.  HEENT: Normocephalic. He wears glasses.  NECK:  Without JVD or bruit.  CHEST:  Clear to auscultation and percussion.  CARDIAC:  Reveals regular rate and rhythm without murmur or gallop.  Normal S1, S2.  His heart sounds are diminished.  ABDOMEN: Obese, nontender.  EXTREMITIES:  Without edema.  Distal pulses are diminished bilaterally.  There are no femoral bruits noted.  SKIN: Warm and dry.  NEUROLOGICAL:  Exam is grossly intact. He is awake, alert and oriented,  cooperative.  He does have some degree of dysarthria and occasionally  has trouble with word finding.   LABS:  White count 6, hemoglobin 10.8, hematocrit 33.2, platelets 193.  Sodium 140, potassium 3.2, BUN 11, creatinine 1.3.  Hemoglobin A1c is  6.5.  CKs were 400.  Troponins  are negative x2.  His LDL was 58.  RPR is  nonreactive.  TSH is 2.04.  B12 level was 532.  Chest x-ray showed no  acute findings.   IMPRESSION:  1. Chest pain, worrisome for unstable angina.  2. Hypertension.  3. Type 2 diabetes.  4. History of smoking.  5. Treated dyslipidemia.  6. A history of prior strokes documented in April 2007, June 2007, and      September 2007.  7. Emotional lability and anxiety, questionable related to CVAs.   PLAN:  The patient will be seen by cardiologist today.  Will consider  further evaluation, possibly risk stratification with Myoview and an  echo.      Abelino Derrick, P.A.      Cristy Hilts. Jacinto Halim, MD  Electronically Signed    LKK/MEDQ  D:  02/26/2008  T:  02/26/2008  Job:  [id number]

## 2010-05-29 NOTE — Assessment & Plan Note (Signed)
[name] [name] is back regarding his right parietal and thalamic strokes.  He  essentially is still in the same boat he was 6 months ago.  He is doing  a bit more around the house.  He still essentially remains sedentary.  He does not get any exercise.  His visions perhaps is improved.  He has  a bit more awareness of his deficits and is less irritable.  Sugars have  been well controlled in the 100s.  Blood pressure has been better.  He  is followed by the Medicine Teaching Service at [facility].   REVIEW OF SYSTEMS:  Notable for the above.  A full review is in the  written health and history section of the chart.   SOCIAL HISTORY:  The patient is married and the wife is supportive and  with him today.   PHYSICAL EXAMINATION:  VITAL SIGNS:  Blood pressure is 118/72, pulse is  71, respiratory rate is 18.  He is sating 96% on room air.  GENERAL:  The patient is pleasant.  He is generally alert and oriented  to name and place.  He cannot tell me the day or date today.  He is able  to follow simple commands but lacks awareness and a working [employer] fast streamer.  He  is having attention to the left side.  He has improved vision to the  left but still lacks in the left lower quadrant.  Sensation is still a 1  out of 2 on the left side.  Strength is 4 plus out of 5 on the left side  and 5 out of 5 on the right.  Minimal rotator cuff signs are seen in the  shoulders.   ASSESSMENT:  1. Right parietal thalamic cerebrovascular strokes.  2. Dysphagia/dysarthria.  3. Hypertension.  4. Left inattention, left lower quadrant field cut.  5. Left rotator cuff tendonitis.   PLAN:  1. Continue blood pressure and sugar control per the medicine service.  2. Encourage exercise and goal-oriented behavior around the house.      His wife will need to help set him up other things he may enjoy      doing from a recreational standpoint as well.  3. He is not appropriate to drive at this point nor work.  4. I will see  him back on an as needed basis in the future.      Ranelle Oyster, M.D.  Electronically Signed     ZTS/MedQ  D:  02/20/2007 16:28:59  T:  02/22/2007 15:30:50  Job #:  [id number]

## 2010-05-29 NOTE — Assessment & Plan Note (Signed)
HISTORY OF PRESENT ILLNESS:  The patient is back regarding his right  parietal and thalamic strokes. He has been at home with his wife. He  really has not been doing much around the house. Generally sits much of  the day. Wife states that he lacks initiative. He has some pain in the  left shoulder but otherwise denies symptoms. His medication was adjusted  by his family doctors and he is now on Cozaar and Norvasc with good  control of his blood pressure. Sugars have been generally controlled.  The patient occasionally goes out with his wife and has been to the  jobsite 1 or 2 times. He is not driving but is anxious to do so.   REVIEW OF SYSTEMS:  Notable for the above. As reported, occasional  coughing. Full review has been written in the health history section of  the chart.   SOCIAL HISTORY:  The patient is married and his wife is supportive. His  wife works.   PHYSICAL EXAMINATION:  VITAL SIGNS: Blood pressure is 132/63, pulse is  61, respiratory rate 18, satting 99% on room air.  GENERAL APPEARANCE: The patient is pleasant, in no acute distress. He is  alert and oriented x3. Affect is appropriate. He walks with fair balance  today and has some difficulty still with heel to toe and ambulation. The  patient still has some inattention to the left side but it is improved.  He has a left lower quadrant field cut of approximately 25% to 50%. Sees  well to all other fields. Sensation remains 1/2 on the left side. He  lacks initiation and focus at times. He lacks awareness in general and  practicality. He is able to process simple information and follow 1, 2  and 3 step commands. Strength is 4 to 4+/5 left upper extremity. He has  rotator cuff impingement sign on the left and some adhesive capsulitis  there as well. Strength on the right side is 5/5.   ASSESSMENT:  1. History of right parietal and thalamic cerebrovascular strokes.  2. History of dysphagia, dysarthria.  3.  Hypertension.  4. Left inattention and left field cut on the left lower quadrant.  5. History of left rotator cuff tendinitis.   PLAN:  1. Continue medications per [facility] teaching service.  2. Monitor sugars.  3. I want the patient to take more initiative at home doing such      things as housework and home upkeep. He needs to work on an      exercise plan.  4. He is not appropriate to drive at this point as his vision and      attentional skills need to improve. We may consider a driving      evaluation at some point during the fall or next year.  5. I will see him back in about 6 months' time.      Ranelle Oyster, M.D.  Electronically Signed     ZTS/MedQ  D:  08/25/2006 16:14:11  T:  08/26/2006 17:11:09  Job #:  [id number]

## 2010-05-29 NOTE — Consult Note (Signed)
NAMEQUINTYN, DOMBEK                 ACCOUNT NO.:  [id number]   MEDICAL RECORD NO.:  [id number]          PATIENT TYPE:  INP   LOCATION:  4731                         FACILITY:  [facility]   PHYSICIAN:  Antonietta Breach, M.D.  DATE OF BIRTH:  02-12-1950   DATE OF CONSULTATION:  02/29/2008  DATE OF DISCHARGE:  02/29/2008                                 CONSULTATION   REQUESTING PHYSICIAN:  C. Ulyess Mort, M.D.   REASON FOR CONSULTATION:  Agitation.   HISTORY OF PRESENT ILLNESS:  Mr. Nyles Mitton is a 60 year old male  admitted to the [facility] on February 25, 2008 due to chest pain. e   Mr. Clayson has been displaying severe agitated mood swings for  approximately 3 days.  He also has been displaying some short-term  memory recall difficulty as well as clouding of consciousness.  This  pattern has been proceeding in a waxing and waning fashion with easy  distractibility and difficulty concentrating.  He is not currently  showing any hallucinations or delusions.  However, his agitation pattern  is making his healthcare process very difficult.   PAST PSYCHIATRIC HISTORY:  He does have a history of agitation mood  swings which have precipitated admissions to the hospital.   He has been continuing with Elavil on his medication regimen.   FAMILY PSYCHIATRIC HISTORY:  None known.   SOCIAL HISTORY:  Mr. Hank is married.  His religion is [place].   PAST MEDICAL HISTORY:  Chest pain evaluation.   Medications, allergies and laboratory data reviewed.   REVIEW OF SYSTEMS:  From the partial contributions of the patient,  staff, medical record and past medical record.  Constitutional, head,  eyes, ears, nose and throat, mouth, neurologic, psychiatric,  cardiovascular, respiratory, gastrointestinal, genitourinary, skin,  musculoskeletal, hematologic, lymphatic, endocrine, and metabolic were  reviewed.   PHYSICAL EXAMINATION:  VITAL SIGNS:  Stable.  GENERAL APPEARANCE:  Mr. Guadamuz is a  middle-aged male lying in a supine  position in his hospital bed with no abnormal involuntary movements.   MENTAL STATUS EXAM:  Mr. Thiam is alert.  He does have slight clouding of  consciousness.  His concentration is decreased.  His attention span is  decreased. He does have some easy distractibility. His affect is  agitated mood. His mood is expansive. He is oriented to all spheres. On  memory testing 3/3 immediate and 2/3 on recall.  Fund of knowledge and  intelligence are below that of his estimated premorbid baseline.  His  speech is overall within normal limits.  Thought process is coherent.  Thought content:  No thoughts of harming himself or others. No delusions  or hallucinations.  Insight is poor. Judgment is impaired.   ASSESSMENT:  AXIS I:  293.00 delirium not otherwise specified.   The delirium does appear to be improved now.  However he continues with  these severe agitation swings.   293.83 mood disorder not otherwise specified, severe agitation swings as  described.   AXIS II:  None.  AXIS III:  See past medical history.  AXIS IV:  General medical.  AXIS V:  20.   RECOMMENDATIONS:  If Mr. Niday's mood swings do continue, and he is free  of hallucinations and delusions would start Depakote at 500 mg sprinkles  p.o. every 1800. The sprinkle form can be put into applesauce or some  similar substance.   Would check a CBC and liver function panel within the first week of  starting Depakote to screen for adverse Depakote effects.   Regarding his Elavil dosing, would try to find an alternative to his  Elavil given that Elavil has significant anticholinergic properties with  the average central nervous system at the age of this patient there will  be an innate cholinergic deficit. Therefore, medications such as Elavil  can reduce the delirium threshold as well as impair memory on a regular  basis.   If psychiatric follow-up is required as an outpatient, clinics are   available at [facility], [facility], [facility].      Antonietta Breach, M.D.  Electronically Signed     JW/MEDQ  D:  05/15/2008  T:  05/15/2008  Job:  [id number]

## 2010-05-29 NOTE — Procedures (Signed)
EEG NUMBER:  [id number].   HISTORY:  This is a 60 year old patient who was admitted with slurred  speech and altered mental status.  The patient is being evaluated for  the above.  This is a routine EEG.  No skull defects are noted.   MEDICATIONS:  1. Normodyne.  2. Aggrenox.  3. Hydrochlorothiazide.  4. Protonix.  5. Zocor.  6. Robaxin.  7. Seroquel.  8. Lovenox.  9. Zestril.  10.DiaBeta.   EEG classification dysrhythmia grade 1 right hemisphere.   DESCRIPTION OF THE RECORDING:  The background rhythm of this recording  consists of a fairly well modulated medium amplitude alpha rhythm of 10  Hz that is a reaction to his eye open and closure.  As the record  progresses, the most notable feature of the recording is an asymmetry  background rhythm with slight beta frequency slowing in the 7 Hz range  off and on emanating from the right hemisphere as compared to the left.  The left hemispheric background rhythm activities are better organized.  Photic stimulation is eventually performed resulting in a bilateral and  symmetric photic driving response.  Hyperventilation is also performed  resulting in minimal buildup of the background rhythm activities without  significant slowing seen.  At no time during the recording do there  appear to have been spike wave discharges.  EKG monitor shows no  evidence of cardiac rhythm abnormalities, a heart rate of 66.   IMPRESSION:  This is minimally abnormal EEG recording due to mild theta  slowing emanating from the right hemisphere.  This recording suggests  some neuronal dysfunction of the right brain.  No clear evidence of  epileptic  discharges are seen at any time.  Slowing consistent with this may be a  result from prior stroke event.  Clinical correlation is required.      Marlan Palau, M.D.  Electronically Signed     ZOX:WRUE  D:  05/26/2006 13:20:24  T:  05/26/2006 14:19:11  Job #:  [id number]

## 2010-05-29 NOTE — Consult Note (Signed)
NAMEREADE, TREFZ                 ACCOUNT NO.:  [id number]   MEDICAL RECORD NO.:  [id number]          PATIENT TYPE:  INP   LOCATION:  4702                         FACILITY:  [facility]   PHYSICIAN:  Pramod P. Pearlean Brownie, MD    DATE OF BIRTH:  1950-06-05   DATE OF CONSULTATION:  05/24/2006  DATE OF DISCHARGE:                                 CONSULTATION   REQUESTING PHYSICIAN:  Corwin Levins, MD   REASON FOR REFERRAL:  Possible stroke.   HISTORY OF PRESENT ILLNESS:  [name] [name] is a 60 year old  African American  gentleman who is well-known to the stroke service because of multiple  hospital admissions and consults last year for several strokes.  He is a  61 year old  [place] American gentleman with a history of strokes in  April 2007, June 2007 and September 2007.  He has  known history of  right parietal MCA infarct with known severe right MCA stenosis as well  as bilateral basal ganglia and right thalamic infarct as well.  He  presents with 1 week history of not doing well, being slow, all over  generalized weakness with some occasional slurred speech as per his  wife.  The wife denies any history of increased focal weakness, balance  problems and double vision.  At last admission in June the patient was  switched from aspirin to Aggrenox.  He has been tolerating that fairly  well.   PAST MEDICAL HISTORY:  Significant for chronic renal insufficiency,  diabetes, hypertension, 3 strokes.   HOME MEDICATIONS:  Robaxin, potassium, labetalol, Aggrenox, Lipitor,  hydrochlorothiazide, folic acid, Protonix, Seroquel, glyburide.   SOCIAL HISTORY:  The patient quit smoking 1 year ago after 40 pack-year  history.  Currently unemployed, is married, lives with his wife.   FAMILY HISTORY:  Is not significant for anybody with strokes.   REVIEW OF SYSTEMS:  Positive for some cough, generalized weakness,  anorexia, chills and imbalance.   PHYSICAL EXAM:  Reveals a pleasant middle-aged African  American  gentleman who is not in distress.  He is afebrile, pulse rate is 70 per  minute regular, respiratory rate 16, distal pulses well felt.  Blood  pressure 135/80.  Distal pulses well felt.  HEAD:  Nontraumatic.  NECK:  Supple without bruit.  ENT exam unremarkable.  CARDIAC:  Regular heart sounds.  LUNGS:  Clear to auscultation.  NEURO:  The patient is awake, alert, oriented to time, place and person.  He has a flat affect.  He has mild pseudobulbar speech with his  discharger.  Eye movements are full range without nystagmus.  There is  mild saccadic dysmetria to the right.  Face is symmetric.  Tongue is  midline.  Motor system exam reveals no pronator drift.  Fine finger  movements are diminished on the left.  He orbits the right over left  upper extremity.  There is minimum weakness of left grip.  Lower  extremity strength is symmetric but tone is increased bilaterally.  There is no sensory loss.  Plantars are both downgoing.  He walks with a  very slow,  cautious wide-based gait but he has good balance.  He can  stand on a narrow base and on either foot with either foot unsupported.   DATA REVIEWED:  CT scan of the head noncontrast study does not reveal  definite new stroke, shows multiple bilateral basal ganglia and old  right parietal infarcts.  Admission labs fairly unremarkable.  Hemoglobin A1c is 6.5.  Total cholesterol is 158 with triglycerides of  117,  HDL 39, LDL 96.  pH is .77.   IMPRESSION:  60 year old gentleman with a 1-week history of generalized  weakness and some mental slowing with mild intermittent dysarthria.  I  doubt the present symptoms represent new stroke.  He is clearly had  multiple subcortical as well as right parietal infarcts with a  combination of small vessel disease and the right middle cerebral artery  stenosis.   PLAN:  I think it would be reasonable to check an MRI to look for  interval strokes but I would be surprised given his clinical  history.  Continue Aggrenox for stroke prevention.  Strict control of  hypertension,  systolic pressure goal below 130 and diabetes control  with hemoglobin A1c goal below seven.  Maintain adequate hydration.  Check EEG for possible seizure activity.   I will be happy to follow the patient in consult.  Kindly call for  questions.           ______________________________  [name] [name]. Pearlean Brownie, MD     PPS/MEDQ  D:  05/24/2006  T:  05/25/2006  Job:  [id number]

## 2010-05-30 ENCOUNTER — Other Ambulatory Visit: Payer: Self-pay | Admitting: Internal Medicine

## 2010-05-30 NOTE — Telephone Encounter (Signed)
After multiple attempts have been unable to get in touch with patient and his wife has not returned phone calls. Pt mailed a letter stating that he needs to reschedule his procedure.

## 2010-06-01 NOTE — Discharge Summary (Signed)
[name], [name]                 ACCOUNT NO.:  [id number]   MEDICAL RECORD NO.:  [id number]          PATIENT TYPE:  IPS   LOCATION:  4025                         FACILITY:  [facility]   PHYSICIAN:  [name] [name], M.D.DATE OF BIRTH:  10/21/1950   DATE OF ADMISSION:  05/03/2005  DATE OF DISCHARGE:  05/24/2005                                 DISCHARGE SUMMARY   DIAGNOSES:  1.  Right parietal thalamus cerebrovascular accident.  2.  Dysarthria secondary to cerebrovascular accident.  3.  Dysphagia secondary to cerebrovascular accident.  4.  Subcutaneous Lovenox for deep venous thrombosis prophylaxis.  5.  Hypertension.  6.  History of alcohol/drug abuse.   A 60 year old black male, history of tobacco, polysubstance abuse admitted  April 16 with altered mental status.  Blood pressure was 160-180 systolic.  Cranial CT scan was subacute infarction right parietal areas as well as  right thalamus.  Echocardiogram with ejection fraction 55% but without  thrombi.  No MRI secondary to alcohol withdrawal.  Transcranial Dopplers not  done secondary to patient being uncooperative.  Neurology, Dr. Pearlean [name], placed  on Aggrenox therapy.  Subcutaneous Lovenox for deep vein thrombosis  prophylaxis.  Blood pressures monitored with the addition of  hydrochlorothiazide and Normodyne.   PAST MEDICAL HISTORY:  1.  See discharge diagnoses.  2.  Noted history of alcohol, marijuana, tobacco use.   SOCIAL HISTORY:  Lives with his wife in [place].  Wife works day shifts,  plans extended family leave if needed.   MEDICATIONS PRIOR TO ADMISSION:  None.   ALLERGIES:  None.   REHABILITATION AND HOSPITAL COURSE:  The patient was admitted to inpatient  rehab services with therapies initiated on a b.i.d. basis consisting of  physical therapy, occupational therapy, speech therapy, and rehabilitation  nursing.  The following issues were addressed during patient's  rehabilitation stay.  Pertaining to [name]'s  right parietal thalamus  cerebrovascular accident with left-sided weakness.  Dysfunction in mobility  had greatly improved.  He was minimal assist to supervision for his  ambulation, needed ongoing cues for safety and awareness.  He was in a [place]  bed for his safety and limited awareness.  He remained on subcutaneous  Lovenox for deep vein thrombosis prophylaxis.  He was initially on Aggrenox  therapy, but he was unable to swallow this due to his dysphagia.  He was  placed on Plavix 75 mg daily, March 07, 2005.  Blood pressures monitored  with Normodyne increased to 200 mg twice daily on May 06, 2005.  Ritalin  was initiated to help patient attain a better focus and awareness during his  therapy and being able to attend better to tasks.  This was initiated at 5  mg at 7 a.m., noon daily, titrated to 10 mg daily.  He was now on a regular  consistency diet but still need of nectar thick liquids.  A swallow study  was to be completed prior to his discharge home on May 24, 2005.  His full  medical management, this patient had no primary care physician prior to  hospital admission and  would be followed by [facility].  He had a long  history of alcohol and drug abuse.  He received full counseling as well as  discussion with his wife and need for cessation of all alcohol and drug use.  He would receive ongoing counseling as needed as an outpatient basis.   Latest labs showed a hemoglobin of 13.6, hematocrit 42.5, platelets 217, WBC  6, sodium 139, potassium 4.2, BUN 9, creatinine 1.3.  Full family teaching  was completed.  He did receive neuro psychology follow up per Dr. Eula Flax.  The patient still presented with a blunted affect.  He was still  easily distracted with some left inattention as well as an oral apraxia.  Again, these did improve during his hospital stay but still with ongoing  memory deficits noted, still being impulsive.  The need for ongoing  supervision again was  discussed with his wife.  He was discharged to home in  stable condition.   DISCHARGE MEDICATIONS AT TIME OF DICTATION:  1.  Zocor 20 mg daily.  2.  Plavix 75 mg daily.  3.  Normodyne 300 mg twice daily.  4.  Potassium chloride 20 mEq daily.  5.  Ritalin 10 mg twice daily.  6.  Desyrel 50 mg q.h.s. p.r.n.  7.  Hydrochlorothiazide 25 mg daily.  8.  Tylenol p.r.n.   ACTIVITY:  As tolerated.   DIET:  Currently regular consistency with nectar liquids.  This was to be  addressed per speech therapy with ongoing speech therapy.  He follow up with  Dr. Faith [name], at the outpatient rehab service office as advised, Dr.  Pearlean [name], neurology services, and [facility] for medical management.      [name] [name], P.A.      [name] [name], M.D.  Electronically Signed    DA/MEDQ  D:  05/23/2005  T:  05/24/2005  Job:  [id number]   cc:   [name] [name]   Dr [name] [name]

## 2010-06-01 NOTE — Consult Note (Signed)
NAMEJULIAS, [name] [name]                 ACCOUNT NO.:  [id number]   MEDICAL RECORD NO.:  [id number]          PATIENT TYPE:  INP   LOCATION:  6711                         FACILITY:  [facility]   PHYSICIAN:  Bevelyn Buckles. Champey, M.D.DATE OF BIRTH:  09/03/1950   DATE OF CONSULTATION:  DATE OF DISCHARGE:                                   CONSULTATION   REASON FOR CONSULTATION:  Stroke.   [name] [name] is a 60 year old African-American male with multiple medical  problems who presents after a 2-day history of lethargy and dysarthria.  The  patient stated that he has been sleeping a lot.  His dysarthria has  gradually improved, however, still fairly prominent.  He does have old  residual deficits from prior right parietal CVA with some left upper  extremity weakness, left visual defect and left-sided neglect/extinction.  The patient denies any other symptoms of expressive aphasia or septa  aphasia, new weakness, new numbness, vision changes, swallowing problems,  chewing problems, falls as well as loss of consciousness.   PAST MEDICAL HISTORY:  Positive for right parietal stroke and hypertension,  elevated lipids.   CURRENT MEDICATIONS:  1.  Triamterene/hydrochlorothiazide.  2.  Trazodone.  3.  Clonidine.  4.  Simvastatin.  5.  Aspirin which was just recently changed from Plavix.   ALLERGIES:  No known drug allergies.   FAMILY HISTORY:  Positive for asthma.   SOCIAL HISTORY:  The patient is married, quit alcohol in April of 2007 and  does have a smoking history.   REVIEW OF SYSTEMS:  Positive as per HPI.  Review of systems negative as per  HPI and greater than 8 other organ systems.   PHYSICAL EXAMINATION:  VITAL SIGNS:  Temperature 97.8, pulse 49,  respirations 18, blood pressure 144/88, O2 sat 99% on room air.  HEENT:  Normocephalic, atraumatic.  Extraocular movements are intact.  Pupils equal, round and reactive to light.  NECK:  Supple.  No carotid bruits.  HEART:  Regular.  LUNGS:   Clear.  ABDOMEN:  Soft, nontender.  EXTREMITIES:  No edema with good pulses.  NEUROLOGICAL:  The patient is awake, alert and oriented x3.  Language is  dysarthric.  Her memory is within normal limits.  Cranial nerves II-XII are  grossly intact except for a left anterior visual field deficit from his old  stroke.  Other examination shows a left upper extremity hemiparesis.  He has  good strength in the other 3 extremities.  He has a left parietal drift.  Sensory examination shows some extinction on the left.  Reflexes are 2-3+.  Her right toes are downgoing bilaterally.  Cerebellar function has some  slight difficulty with finger-to-nose on the right secondary to weakness.  Gait is not assessed secondary to safety.   CT of the head showed an old parietal infarct and a left basal ganglion  infarct, which is questionable new.   LABS:  WBC 5.5, hemoglobin 11.8, hematocrit 37.1, platelets 187.  Sodium  137, potassium 3.5, chloride 103, CO2 30, BUN 14, creatinine 1.2 glucose  126, AST 30, ALT 41, calcium 9.3, ammonia  29, hemoglobin A1c 7.2.  Cholesterol 136, LDL 75, HDL 35.  Carotid Dopplers are negative.   IMPRESSION:  This is a 60 year old with dysarthric speech and questionable  new left basal ganglia stroke.  I agree with changing the aspirin to  Aggrenox and continue stroke workup with MRI and 2-D echo.  We will also  check a homocystine level, get PT/OT and speech consults as well.  I need to  address the patient's bradycardia.  Last pulse was 49.  This could possibly  be contributing to his strokes.  We will follow the patient while he is in  the hospital.      Bevelyn Buckles. Nash Shearer, M.D.  Electronically Signed     DRC/MEDQ  D:  06/30/2005  T:  07/01/2005  Job:  [id number]

## 2010-06-01 NOTE — Assessment & Plan Note (Signed)
[name] [name] is back regarding his right parietal and thalamic strokes.  He is  generally improving.  He has interest in getting back to work but seems  to have some insight as to know he is not ready for it yet.  He has 4-  5/10 pain still.  It seems to be centered around his shoulders but is  very intermittent.  The pain interferes with general activity,  relationships with others and enjoyment of life on only a moderate  level.  The patient still needs assistance with dressing and bathing at  times, more due to spatial deficits and coordination but is doing a lot  of it on his own currently.  His wife reports that he may have changed  his blood pressure medication.  He is still taking HCTZ and she is not  sure if he is on Enalapril currently.   REVIEW OF SYSTEMS:  Positive for occasional spasms of his legs.  Sugars  have been good at 90-105.  Blood pressure has been elevated at times at  over 140-160s to 80-90s.  Full review is in the written health and  history section.   SOCIAL HISTORY:  The patient is married and the wife is supportive.   PHYSICAL EXAMINATION:  Blood pressure 156/81, pulse 72, respiratory rate  16.  He is satting 99% on room air.  The patient is pleasant, in no acute distress.  He is alert and oriented  x3.  Affect is generally bright and appropriate.  Gait is improved.  He is able to walk heel-to-toe with better balance  today.  His attention is better to the left side but still has some  inattention nonetheless.  Sensation remains 1/2, particularly in the  arms more than the legs.  He had decreased spine and motor coordination.  I asked him to unbutton the top three buttons of his shirt and he was  unable to get any of them open except for the bottom button.  He had  better scanning with his eyes.  He is able to see to all fields except  for the left lower quadrant.  He is able to identify three numbers and  repeat them to me with clear enunciation.  Rotator cuff  signs were  improved on both sides.  Visual accuracy was better from distance today.  Motor function generally was 5/5 on the right and 4 plus to 5 minus/5 on  the left today.  Insight and awareness were better, although processing  was slow still today, as were organizational skills.   ASSESSMENT:  1. History of right parietal thalamic cerebrovascular accident.  2. History of dysphagia, dysarthria.  3. Hypertension.  4. Left inattention in higher level cognitive tests.  5. History of left rotator cuff tendinitis.   PLAN:  1. Wife will check on dose of Enalapril or if, in fact, he is even on      it still.  If he is taking it, will increase to 20 mg b.i.d.      Continue HCTZ.  2. Continue sugar monitoring.  3. Continue exercises and coordination therapy at home.  4. No driving still.  The patient is not appropriate for return to      work at this time due to his visual/spatial deficits, although they      are improved.  5. Continue appropriate health, hygiene, exercise, etc.  6. Will see the patient back in four months time.  He continues to      improve  nicely.     Ranelle Oyster, M.D.  Electronically Signed    ZTS/MedQ  D:  05/05/2006 15:38:57  T:  05/06/2006 00:33:10  Job #:  [id number]

## 2010-06-01 NOTE — Discharge Summary (Signed)
[name] [name], [name] [name]                 ACCOUNT NO.:  [id number]   MEDICAL RECORD NO.:  [id number]          PATIENT TYPE:  INP   LOCATION:  6711                         FACILITY:  [facility]   PHYSICIAN:  [name] [name], M.D.    DATE OF BIRTH:  09/29/1950   DATE OF ADMISSION:  06/30/2005  DATE OF DISCHARGE:  07/04/2005                                 DISCHARGE SUMMARY   DISCHARGE DIAGNOSES:  1.  Stroke, right parietal infarct, with severe stenosis of right middle      cerebral artery.  2.  Orthostatic hypotension, ? autonomic.  3.  Chest pain, rule out myocardial infarction.  4.  Newly diagnosed diabetes.  5.  Anemia.  6.  Hypertension.   DISCHARGE MEDICATIONS:  1.  Aggrenox 1 capsule p.o. b.i.d.  2.  Aspirin 81 mg p.o. once daily.  3.  Omeprazole 40 mg p.o. once daily.  4.  Zocor 40 mg p.o. once daily.  5.  Metformin 500 mg p.o. once daily.  6.  Hydrochlorothiazide 25 mg p.o. once daily.  7.  Enalapril 10 mg p.o. once daily.  8.  Folic acid 1 mg p.o. once daily.   CONDITION ON DISCHARGE:  At the time of discharge the patient was stable.  He did not have any slurring of speech.  He did not have orthostatic  hypotension.  He was worked up by Cardiology for an MI and was found to be  negative in general.  At the time of  follow up, his BMET needs to be  checked as he has recently been put on Metformin, and his enalapril dose has  been increased.  His blood pressure needs to be monitored as his medications  have been changed.  He also needs strict control of his risk factors to  prevent further strokes; mainly diabetes, hypertension and hyperlipidemia.  The patient will follow up at [facility].  He will need a BMET over the  next few days.   PROCEDURES AND IMPORTANT DIAGNOSTIC STUDIES:  CT scan of the head was done  without contrast, which showed left basal ganglia lacunar infarct in the  posterior limb internal capsule which was new and involving right parietal  infarct.  MRI scan  which was done subsequently showed chronic ischemic  changes in the basal ganglia, thalamus and pons bilaterally, a small right  parietal infarct and extension of a small area of acute infarct in the right  parietal white matter.  There was also severe stenosis of the right MCA, a  severe stenosis of the left cavernous carotid and scattered atherosclerotic  intracranial disease.  A 2D echo was done, which showed an EF of 45% to 55%.  Left ventricular function was at the lower limit.  An NM scan was done,  which showed a fixed perfusion defect in the anterolateral apical region, no  stress-induced ischemia and an ejection fraction of 41%.   CONSULTATIONS:  Dr. Pearlean [name], Neurology.  Dr. Randa [name], Cardiology.   BRIEF ADMITTING HISTORY AND PHYSICAL:  [name] [name] is a 60 year old African-  American male with a past history of a right  parietal infarct, hypertension  and past history of polysubstance abuse.  He presented with slurred speech  and lethargy for 2 days.  His antihypertensive medications were recently  changed and since then he had been sluggish with slurred speech and tongue  deviation toward the right side.  He denies a history of headache, fever,  chest pain, seizures or vision changes.   ALLERGIES:  No known drug allergies.   PAST MEDICAL HISTORY:  1.  Right parietal stroke in April 2007.  2.  Hypertension.  3.  History of alcohol abuse, tobacco abuse.  4.  History of dyslipidemia.   MEDICATIONS:  1.  Triamterene /hydrochlorothiazide 37.5/25 mg daily.  2.  Trazodone 50 mg daily.  3.  Clonidine 0.2 mg b.i.d.  4.  Simvastatin 20 mg daily.  5.  Plavix 75 mg daily.  6.  Potassium 20 mEq daily.   SUBSTANCE AND SOCIAL HISTORY:  A former smoker, smoked 3 packs per day for  20 years.  Alcohol, used to drink 3 drinks a day every 7 days, stopped in  April.  History of marijuana use in the past.  Does not have insurance.  Worked as a Music therapist.   FAMILY HISTORY:  Mother has asthma,  has two healthy children.   VITAL SIGNS:  Orthostatics:  On lying 133/86, on sitting 117/66, and on  standing 89/59.  Temperature 99.8, respiratory rate 24, 02 saturations 98%  on room air.   PHYSICAL EXAMINATION:  GENERAL:  Examination normal.  Not in acute distress.  EYES:  Pupils are equal and reactive.  ENT:  Oropharynx is clear.  NECK:  JVD negative.  Bruits negative. Lymph nodes negative.  RESPIRATORY:  Clear to auscultation bilaterally.  CVS:  S1 and S2 present.  Regular rate and rhythm.  GI: Bowel sounds present.  Nontender and nondistended.  NEURO: Cranial nerves 2 through 12  intact except for slurred speech.  Motor  5/5 right side, 3/5 on left side. Sensory decreased on the left side.  Reflexes equal and full bilaterally.  Cerebellar signs negative.  PSYCH: Oriented x3.   LABORATORY DATA:  Sodium 137, potassium 3.5, chloride 102, bicarb 30, BUN  14, creatinine 1.2, glucose of 136.  Hemoglobin 11.8, hematocrit 37,  WBC  5.5, platelets 189.  Bilirubin 0.7, alkaline phosphatase 62, SGOT 30, SGPT  41, protein 6.7, albumin 3.7 and calcium 9.3.   PROBLEMS:  1.  CVA.  Right parietal infarct with severe right MCA stenosis.  The patient presented with slurred speech and lethargy.  On evaluation of  his MRI scan, there was a right parietal infarct with an extension of a  small area of acute infarct in the right parietal white matter.  He also had  severe stenosis of the right MCA and also stenosis of the left cavernous  carotid artery.  He was put on Aggrenox.  He was seen by Dr. Pearlean [name] from  Neurology, who felt that stenting was not an appropriate option because of  issues with noncompliance and polysubstance abuse. Currently he is being  managed medically with Aggrenox and folic acid because of elevated  homocysteine. His secondary risk factors, mainly diabetes, hypertension and hyperlipidemia, are being strictly controlled.  During hospitalization he  received physiotherapy,  which helped him.  He will receive physiotherapy at  home.  1.  Hypertension.  His antihypertensives were initially held because of      orthostatic hypotension, but at the time of discharge he was put back on  hydrochlorothiazide 25 mg once daily, Enalapril 10 mg once daily.  His      blood pressure needs to be monitored and he needs strict control of his      blood pressure.  2.  Orthostatic hypotension.  At the time of admission he was found to be      orthostatic.  In spite of adequate fluid resuscitation, his blood      pressure did not improve significantly .  The other likely consideration      was autonomic dysfunction probably due to his strokes, so he was put on      compression stockings, which led to an improvement in his orthostatic      blood pressures.  At the time of discharge he was not orthostatic and      was using his compression stockings. He would probably need to use these      compression stockings on a regular basis and needs to stand up from a      sitting position gradually.  A serum cortisol level was checked to rule      out adrenal disorders,  which was normal.  3.  Chest pain/bradycardia with slight elevation in cardiac enzymes.  He was      seen by Dr. Randa [name] from Cardiology.  Subsequent cardiac enzymes were      normal.  According to Dr. Randa [name], the chest pain was not typical and      although the cardiac enzymes were elevated, he did not think that the      patient had coronary artery disease.  A Myoview study was done, which      showed an EF of around 41%, no stress-induced ischemia but a fixed      perfusion defect in the anterolateral apex.  Dr. Randa [name] felt that this      could be due to apical thinning only, and the left ventricular function      was normal on visual inspection.  If the patient develops recurrent      chest pain, he will follow up with Dr. Randa [name].  At this point in time      he is on aspirin 81 mg a day.  4.  Hyperlipidemia.   During this admission his LDL cholesterol level was      found to be 75.  He will continue on Zocor 40 mg daily.  5.  Diabetes.  Newly diagnosed during this admission.  His HbA1c was found      to be 7.2, and his fasting blood sugars were more than 120.  He has been      started on Metformin 500 mg daily, and he needs to be followed up and      medications titrated accordingly.  6.  Anemia.  This is most likely anemia of chronic disease.  His fecal      occult blood is negative.  He has an elevated ferritin level of more      than 600 with TIBC of 307, iron of 70.  Although the etiology of the      anemia of chronic disease is not known, it could be due to his chronic      alcohol abuse.  Of note, his viral hepatitis panels were negative.   At the time of discharge, his orthostatic blood pressures were within normal  limits.  His blood pressure was 137/67, respiratory rate 18, pulse 57 and temperature 98.7.  His blood glucose 108.  His sodium was 140, potassium  3.6, chloride 109, CO2 of 25, glucose 93, BUN 11, creatinine 1.2 and calcium  9.4.  His cortisol level was 13.1.      [name] [name], M.D.  Electronically Signed     YB/MEDQ  D:  07/04/2005  T:  07/04/2005  Job:  [id number]   cc:   Dr. Randa [name]

## 2010-06-01 NOTE — H&P (Signed)
[name] [name], [name] [name]                 ACCOUNT NO.:  [id number]   MEDICAL RECORD NO.:  [id number]          PATIENT TYPE:  INP   LOCATION:  0102                         FACILITY:  [facility]   PHYSICIAN:  Michael L. Reynolds, M.D.DATE OF BIRTH:  [birth year]   DATE OF ADMISSION:  04/27/2005  DATE OF DISCHARGE:                                HISTORY & PHYSICAL   CHIEF COMPLAINT:  Acting strange.   HISTORY OF PRESENT ILLNESS:  This is the initial inpatient consultation  evaluation of this 60 year old man with no known medical problems and no  regular medical care.  The patient was reportedly driving earlier this  afternoon and seemed to be normal until he took a nap and then woke up at  his house at about 5:00 p.m.  On waking he was noted to be acting  strangely.  For example, he put his pants on backwards and he seemed to  have difficulty buttoning his shirt.  His speech seemed a little bit slurred  at that time, but for the most part was appropriate.  He then went to a  social event and was noted by several people to be looking not right.  He  was also having some difficulty using his utensils, and had to have his food  cut up for him.  Eventually, because of his strange behavior, he was brought  to the [facility] emergency room.  At that time he was found to have  markedly elevated blood pressure, but no focal symptoms were noted.  A code  stroke was called and a CT of the head demonstrated subacute stroke.  Neurology evaluation was subsequently requested.  The patient denies any  similar symptoms in the past.  He says he was aware of acting funny, but  was not able to describe very much about it.   PAST MEDICAL HISTORY:  Denies any known chronic medical problems including  hypertension, diabetes or heart disease.   FAMILY HISTORY:  Denies any known family medical problems.   SOCIAL HISTORY:  He smokes about two packs of cigarettes a day.  He drinks 6-  12 beers a day.  He occasionally  uses marijuana.  He is normally independent  in his activities of daily living.   ALLERGIES:  NO KNOWN DRUG ALLERGIES.   MEDICATIONS:  None.   REVIEW OF SYSTEMS:  Full 10-system review of systems is negative except as  outlined in the HPI and in the ER and admission nursing records.   PHYSICAL EXAMINATION:  VITAL SIGNS:  Afebrile, blood pressure 205/115, pulse  89, respirations 22.  GENERAL EXAMINATION:  This is a healthy man, seated in no evident distress.  HEAD:  Cranium is normocephalic and atraumatic.  Oropharynx is benign.  NECK:  Supple without carotid or supraclavicular bruits.  HEART:  Regular rate and rhythm without murmurs.  CHEST:  Clear to auscultation bilaterally.  ABDOMEN:  Soft, normoactive bowel sounds, mild hepatomegaly.  EXTREMITIES:  No edema and 2+ pulses.  NEUROLOGIC:  Mental status:  He is awake and alert.  He is oriented to time and place.  Recent and remote memory are intact.  He is a little bit distractible and  seems to be a little bit slow answering questions and following complex  commands, although generally he is able to do it.  His speech is hesitant,  but appropriate and not particularly dysarthric.  Cranial nerves:  Funduscopic exam is benign.  Pupils are equal and briskly  reactive.  Extraocular movements are fully without nystagmus.  Visual fields  are full to confrontation.  There may be a bit of left hemi-neglect.  Facial  sensation is intact to pinprick.  Face, tongue and palate moved normally and  symmetrically.  Motor:  Normal bulk and tone.  Normal strength.  No tenderness of any  muscles.  Sensation:  Intact to light touch and pinprick in all extremities.  He  sometimes extinguishes to double simultaneous stimulation in the left upper  and lower extremities.  Coordination:  Rapid movements are performed adequately.  Finger-to-nose is  performed adequately.  Gait is deferred.  Reflexes are 1+ and symmetric.  Toes are downgoing  bilaterally.   LABORATORY REVIEW:  CBC:  White count is 7.1, hemoglobin is 14.6, platelets  are 213,000.  Chemistries are unremarkable except for an elevated glucose of  122.  Alcohol level is 30.  Coags are normal.  Urine drug screen is  negative.  CT of the head demonstrates subacute strokes in the right  parietal area, as well as in the right thalamus.   IMPRESSION:  1.  Acute right parietal stroke with resultant acute confusional state.  2.  Malignant hypertension.   PLAN:  Will admit to the ICU for a labetalol drip to keep his systolic blood  pressure in the 160-180 range.  In the meantime, we will start p.o.  medications and hope to wean off the drip.  He will need a routine stroke  workup including MRI, MRA, carotid and transcranial Dopplers, 2-D  echocardiogram and stroke labs.  Of note, his stroke screen was performed by  the RN in the [facility] emergency room, which he passed, and we will be  able to soon start p.o. medications and diet.  Stroke service to follow.      Michael L. Thad Ranger, M.D.  Electronically Signed     MLR/MEDQ  D:  04/27/2005  T:  04/29/2005  Job:  [id number]

## 2010-06-01 NOTE — Assessment & Plan Note (Signed)
[name] [name] is back regarding his right parietal thalamic stroke.  Apparently he  had a new stroke about a week and a half ago. He has had decreased vision  since then.  It may have been a small cortical infarct.  He continues to  have problems with his blood pressure and sugars although blood pressure has  been a bit better. I looked through the log of his numbers today.  He  remains on Glucophage still for sugars.  He is taking enalapril 10 mg a day.  The patient reports pain in his left arm, particularly at the left elbow and  shoulder. They only bother him with movement.  He still has numbness in the  left arm and lesser so in the leg.  Sleep is good.   REVIEW OF SYSTEMS:  Positive for numbness, tingling, anxiety, confusion,  labile sugars, occasional coughing and wheezing. Full review is in the  health and history section.   SOCIAL HISTORY:  The patient is married and the wife remains very  supportive.   PHYSICAL EXAMINATION:  VITAL SIGNS:  Blood pressure is 138/59, pulse 66,  respiratory rate 16. He is satting 96% on room air.  GENERAL:  The patient is pleasant, in no acute distress.  HEENT:  He appears to have somewhat of a disconjugate gaze and has problems  tracking to the peripheries.  He denies frank double vision.  NEURO:  Coordination is decreased in the left upper extremity. He has 0 to 1  out of 2 sensation. There was pain with movement of the elbow and shoulder  today. Trace tone was noted there at trace to 1 out of 4. Motor function is  3 out of 4 to 5 on the left upper extremity. Left lower extremity strength  is 4+ to 5 out of 5. Right side strength is 5 out of 5. He does decreased  fine motor coordination, dysmetria of the left upper extremity.  He might  have some rotator cuff  signs in the left shoulder. The shoulder is  certainly tight consistent with frozen shoulder. The patient remains  dysarthric.  He has fair awareness and attention today. He is a bit  impulsive  still.   IMPRESSION:  1. History of right parietal thalamic cerebrovascular accident.  2. History of dysphasia and dysarthria.  3. Hypertension.  4. Non-insulin-requiring diabetes.   PLAN:  1. Resume physical therapy and occupational therapy for balance and upper      extremity movement.  2. Recommend heat and ice for now to the left elbow and shoulder. I think      at least part of this is tone related. There may be a neuropathic      component to his pain.  3. Increase enalapril to 10 mg b.i.d. as I do not think he ever did this      before.  4. Discussed close monitoring of his sugars and blood pressure at home and      a better way to do that.  5. Will send the patient to the nutrition and diabetes outpatient clinic      for dietary advice in hopes of better controlling sugars.  6. I will see the patient back in about a month time.      Ranelle Oyster, M.D.  Electronically Signed     ZTS/MedQ  D:  10/15/2005 15:59:34  T:  10/16/2005 18:45:31  Job #:  [id number]

## 2010-06-01 NOTE — Discharge Summary (Signed)
NAMEKRAVEN, CALK                 ACCOUNT NO.:  [id number]   MEDICAL RECORD NO.:  [id number]          PATIENT TYPE:  INP   LOCATION:  4702                         FACILITY:  [facility]   PHYSICIAN:  Beatrix Fetters, M.D.     DATE OF BIRTH:  Aug 01, 1950   DATE OF ADMISSION:  05/23/2006  DATE OF DISCHARGE:  05/26/2006                               DISCHARGE SUMMARY   CONTINUITY PHYSICIAN:  Dr. Armanda Magic at the [facility].   DISCHARGE DIAGNOSES:  Include:  1. Altered mental status with confusion and altered gait secondary to      unknown etiology, resolved by discharge.  2. Hypertension.  3. Diabetes mellitus type 2.  4. Chronic renal insufficiency.  5. Hyperlipidemia.  6. History of three cerebral vascular accidents in April 2007, June      2007, and September 2007.   DISCHARGE MEDICATIONS:  Include:  1. Robaxin 500 mg p.o. b.i.d.  2. K-Dur 20 mEq daily.  3. Labetalol 300 mg p.o. b.i.d.  4. Aggrenox p.o. daily.  5. Lipitor 10 mg p.o. daily.  6. HCTZ 25 mg p.o. daily.  7. Folic acid 1 mg p.o. daily.  8. Protonix 40 mg p.o. daily.  9. Seroquel 200 mg p.o. q.h.s.  10.Glyburide 2.5 mg p.o. daily.   FOLLOWUP:  The patient is to follow up with Dr. Armanda Magic at the [facility] on May 28, 2006, at 3:15 p.m.   CONSULTATIONS:  Included Dr. Riley Kill of [facility].   ADMITTING HISTORY AND PHYSICAL:  Mr. Weigelt is a 60 year old male with a  past medical history of multiple CVAs, hypertension, hyperlipidemia,  diabetes, and chronic renal insufficiency who presents to the [facility]  ED after a 1-week history of slurred speech, drooling, and slowed gait,  as well as decreased appetite.  His wife also accompanied him to the  outpatient clinic on the day of admission, because she was also  concerned about his blood pressure, which had been higher after his  lisinopril was stopped.  The patient himself has not noticed any of  these changes or any of these  symptoms, neither the changes in speech  nor his drooling but does recognize that he feels like he is sleeping  more and walking slower.   PHYSICAL EXAMINATION:  VITAL SIGNS:  Temperature 98.8, blood pressure  143/86, pulse 76, respirations 15, O2 sat 98% on room air.  GENERAL:  The patient was alert and oriented x3.  HEENT:  EOMI.  Sclerae anicteric.  Oropharynx is clear.  Tongue is  midline.  NECK:  Neck had no bruits.  LUNGS:  Clear to auscultation bilaterally.  HEART:  Regular rate and rhythm.  ABDOMEN:  Soft, positive bowel sounds, nontender.  EXTREMITIES:  Had no clubbing or cyanosis but trace edema.  He had no  CVA tenderness.  SKIN:  Warm and dry.  MUSCULOSKELETAL:  Strength was 5/5 bilaterally, upper extremities and  lower extremities.  NEURO:  Cranial nerves II-XII are grossly intact.  PSYCH:  The face showed drooping on the left with no facial asymmetry.  The  patient with no cerebellar deficits.  Strength was slightly  decreased on the left side.   ADMITTING LABORATORY:  Include sodium 138, potassium 4.4, chloride 104,  bicarb 27.  Glucose 115.  BUN 20 and a creatinine of 1.46.  Hemoglobin  11.4, hematocrit 35.4, hemoglobin A1c was 6.5, PT was 13.3 with an INR  of 1.0, PTT was 31.   PROCEDURES PERFORMED:  The patient had a CT of the head without contrast  that showed stable old infarcts and minimal atrophy but no acute  abnormalities.  An MRA of the head and neck showed no evidence of an  acute intracranial abnormality, a stable chronic large right middle  cerebral artery territory infarct and sequelae of eventful small vessel  disease.  Neck:  MRI did show atherosclerotic disease without internal  carotid stenosis, suspected 60% stenosis at the origin of the left  vertebral artery.  The right vertebral artery origin was not well  visualized.  He also had a stable occlusion of the right middle cerebral  artery, posterior to vision, has stable cavernous segment,  internal  carotid artery, atherosclerotic induced bilaterally and stable diffuse  atherosclerotic disease with posterior cerebral artery.  The patient  received an EEG that showed minimally abnormal EEG recording due to mild  _______ swelling emanating from the right hemisphere that would suggest  some neural dysfunction of the right brain but no clear evidence of  epileptic discharges were seen at any time.  Swelling consistent with  this may be a result of prior events.   CONSULTATIONS:  Dr. Pearlean Brownie was consulted from [facility] to  evaluate the patient considering considerable  past medical history of  multiple CVAs.  Dr. Pearlean Brownie, upon his exam did not think that his symptoms  represented a new stroke, and he had recommended the MRI as well as any  EEG to rule out possible seizure activity.   PROBLEM:  1. Altered mental status with altered speech and altered gait:  All      symptoms did resolve by discharge.  Dr. Pearlean Brownie, as previously      mentioned was consulted to further evaluate for possible new      stroke.  Dr. Pearlean Brownie did not believe that his symptoms were due to      any stroke, but he did recommend an MRI that was negative for any      stroke as well as an EEG to rule out possible seizure activity,      which was also negative.  Considering his symptoms resolved during      his hospital admission and that they had been going on for 1 week      prior to his admission, no aggressive intervention would have been      warranted anyway.  The recommendations at discharge was for the      patient to continue his Aggrenox as well as to aggressively control      his other medical problems,, such as, strict control of his      hypertension with aggressive blood pressure control with a systolic      pressure goal below 130 as well as a diabetes control with the      hemoglobin A1c goal below 7, and for the patient to maintain      adequate hydration. 2. Hypertension:  After a stroke  was ruled out in the differential,      all of his blood pressure medications were added back to his  regimen, and his blood pressure was at goal at discharge, and he      was to receive followup in his blood pressure regimen as an      outpatient.  3. Chronic renal insufficiency:  That remained stable during his      hospital stay.  At discharge, it was 1.48.  4. Diabetes mellitus:  A hemoglobin A1c is 6.5, which is goal at less      than 7, and his CBGs remained was controlled on glyburide.  5. History of multiple CVAs:  The patient is to continue Aggrenox at      discharge.   DISCHARGE LABORATORY:  Include sodium 139, potassium 3.7, chloride 101,  bicarb 29.  Glucose 93, BUN 17, creatinine 1.48, hemoglobin 11.8,  hematocrit 36.1.   DISCHARGE VITALS:  Temperature 98.4, pulse 64, respirations 20, blood  pressure 107/64, O2 sat 97% on room air with a CBG of 106.      Beatrix Fetters, M.D.     CA/MEDQ  D:  06/27/2006  T:  06/27/2006  Job:  [id number]

## 2010-06-01 NOTE — Assessment & Plan Note (Signed)
[name] [name] is back regarding his right parietal and thalamic stroke.  His main  complaint is left elbow pain still.  He really has not been using ice.  He  has been doing some arm exercises.  He has not resumed therapy, as they  never contacted him.  He rates his pain as a 9/10.  Wife still notes  confusion at home with problems with memory and orientation at times as well  as impulsivity.  Sleep is good.  Patient is walking better in general.  Sugars and blood pressure have been under better control at home.   REVIEW OF SYSTEMS:  Patient reports no changes other than those mentioned  above.  Full review of systems is in the health and history section of the  chart.   SOCIAL HISTORY:  Patient is without change today.   PHYSICAL EXAMINATION:  VITAL SIGNS:  Blood pressure 155/54, pulse 51,  respiratory rate 16.  He is satting 99% on room air.  GENERAL:  Patient is pleasant in no acute distress.  He is alert and  oriented x3.  NEUROMUSCULAR:  Gait still remains disconjugate, and he has more difficulty  when he tracks to the peripheries.  He denies double vision today.  He  continues to have 0-1/2 sensation in the left arm and leg today.  There is  trace tone noted.  He definitely has some apraxia with movement.  Left lower  extremity strength is +4-5/5, upper extremity strength is 4/5.  Right-sided  motor function is 5/5.  He continues to have rotator cuff signs on the left  side which in fact seem to be more correlated to his elbow pain.  I was  unable to elicit any direct elbow pain or lateral epicondylar signs today.  He has a tight shoulder consistent with adhesive capsulitis.  He remains  dysarthric, and cognitive issues are noted, including decreased awareness,  attention, and impulsivity.   ASSESSMENT:  1. History of right parietal/thalamic cerebrovascular accident.  2. History of dysphagia and dysarthria.  3. Hypertension.  4. Non-insulin-requiring diabetes.  5. Left rotator cuff  tendinitis/adhesive capsulitis.   PLAN:  1. Continue enalapril 10 mg b.i.d.  Blood pressure seems a bit improved.  2. Continue to monitor sugars.  3. After informed consent, we injected the left rotator cuff via the      posterior approach with 3 cc of lidocaine and 40 mg of Kenalog.      Patient tolerated it well.  4. Resume PT/OT.  5. I will see the patient back in two months time.  Ranelle Oyster, M.D.  Electronically Signed     ZTS/MedQ  D:  11/12/2005 16:06:02  T:  11/13/2005 08:33:34  Job #:  [id number]

## 2010-06-01 NOTE — Discharge Summary (Signed)
[name] [name], WACH                 ACCOUNT NO.:  [id number]   MEDICAL RECORD NO.:  [id number]          PATIENT TYPE:  INP   LOCATION:  4731                         FACILITY:  [facility]   PHYSICIAN:  C. Ulyess Mort, M.D.DATE OF BIRTH:  August 16, 1950   DATE OF ADMISSION:  02/26/2008  DATE OF DISCHARGE:  02/29/2008                               DISCHARGE SUMMARY   DISCHARGE DIAGNOSES   1. Anxiety-induced chest pain, with negative EKG, cardiac enzymes, and      cardiac Myoview studies.  2. Type 2 diabetes with hemoglobin A1c of 6.8.  3. Hypertension.  4. Chronic renal insufficiency with creatinine of 1.5-2.0.  5. Hyperlipidemia.  6. History of cerebrovascular accident in 2004, 2006, and 2007 with      residual weakness on right side.  7. Mood disorder, otherwise nonspecified.  8. Depression.  9. Chronic normocytic anemia, with normal anemia panel.   DISCHARGE MEDICATIONS   1. Aggrenox 1 pill twice daily.  2. Lipitor 40 mg 1 pill once daily.  3. Trandate (labetalol) 30 mg twice daily.  4. Norvasc 5 mg 1 pill once daily.  5. Cozaar 100 mg 1 pill once daily.  6. Nexium 40 mg 1 pill once daily.  7. Amitriptyline 50 mg 1 pill at bedtime.  8. Megace 40 mg suspension 2 spoonfuls twice daily.  9. Coreg 6.25 mg 1 pill twice daily.  10.Potassium chloride 20 mEq 1 pill once daily.   DISPOSITION AND FOLLOWUP   The patient is to follow up with Dr. [name] in the Outpatient Clinic at  [facility] on March 16, 2008, at 1:30 p.m.  The patient is to  get CBC on the day of hospital followup.  On the day of hospital  followup, the patient needs to be reassessed regarding further episodes  of any chest pain.  If the patient continues to have any chest pain,  stat EKG and cardiac enzyme should be done.  During the hospital  admission, based on the patient's chest pain, it was thought to be  related to his anxiety.  Given his negative EKGs, cardiac enzymes, and  negative cardiac Myoview, his  chance of the chest pain being cardiac in  etiology is very less. But given his risk factors, its still a  possibility.  A psychiatric consultation was done during hospitalization  for the patient's mood swings.  If the patient continues to have any  more mood swings, or if the patient's wife complains of any mood swings  that is contributing to his chest pain, Dr. [name] recommended  starting Depakote starting at 500 mg p.o.  q1800 with followup of CBC  and LFTs.   PROCEDURES   1. Chest x-ray, February 25, 2008, impression:  No acute      cardiopulmonary findings.  Stable appearance of chest.  2. Cardiac Myoview, impression:  No clear evidence for a reversible      defect or ischemia.  There is slightly decreased uptake along the      inferior wall on the stress images. but this does not clearly  represent a reversible defect.  Ejection fraction measures 43% and      previously measured 41%.  3. Cardiac enzymes x3 negative.  4. Fasting lipid panel, total cholesterol 115, HDL cholesterol 31, and      LDL 58.  RPR nonreactive.  Hemoglobin A1c 6.5.  Vitamin B12 level      532.  TSH 2.0.  Anemia panel, vitamin B12 of 535, serum folate      greater than 20, serum ferritin 354, RBC folate 1201.   CONSULTATIONS   1. Cardiology.  2. Psychiatry.   BRIEF ADMITTING HISTORY AND PHYSICAL   The patient is a 60 year old African American male with past medical  history of type 2 diabetes, hypertension, hyperlipidemia, depression,  chronic renal insufficiency, and stroke, who comes today with chest  pain, retrosternal that radiates to left shoulder and burning sensation.  Pain started at around 7 p.m. on the day of admission.  It was  dissipated by the emotional, insertion, and arguments with his family.  Pain was persistent and was relieved in the ED with nitroglycerin paste.  Pain is not associated with any diaphoresis, palpitations, shortness of  breath, altered mental status,  headaches, nausea, or vomiting.  He has  similar episode of chest pain the day before the admission.  He does not  report any bowel or bladder disturbance.  As per patient's wife, his  anxiety and anger are present before he experiences the strokes.  He is  compliant with his medications.  No complaints of new neurological  deficits or altered mental status.   VITAL SIGNS:  Temperature 98.7, blood pressure 117/64, pulse rate of 74,  respiratory rate 16, and oxygen saturation 96% on room air.  GENERAL:  He appears slightly agitated, but he is not in any acute  distress.  EYES:  Pupils are equal, round, and reactive to light.  Extraocular  movements are intact.  No icterus.  ENT:  Moist mucous membranes.  NECK:  No JVD.  No thyromegaly.  RESPIRATORY:  Clear to auscultation bilaterally.  S1 and S2 normal.  No  wheezing.  No rhonchi.  GI:  Abdomen is soft, nondistended, and nontender.  No organomegaly.  EXTREMITIES:  No pedal edema.  GENITOURINARY:  No CVA tenderness.  SKIN:  No rashes.  No lesions.  LYMPH:  No palpable lymphadenopathy.  NEUROLOGIC:  The patient is anxious, alert and oriented x3.  Cranial  nerves II-XII are grossly intact.  Right upper extremity strength is  3/5, left upper extremity motor strength is 4-5/5, lower extremity  strength is 3/5, could not assess gait, no aphasia, no neglect.  Sensation is intact to light touch.   ADMISSION LABORATORY DATA  CBC:  White count 6.3, hemoglobin 11.1, MCV 81.5, and platelet count  197.  BMP:  Sodium 141, potassium 3.8, chloride 110, glucose 96, BUN 12,  and creatinine 1.4.   HOSPITAL COURSE   1. Anxiety-induced chest pain.  The patient had 1 episode of chest      pain on the day of admission and 1 episode of chest pain on the day      prior to admission.  Both were related to anxiety that occured      during arguments with his wife.  The patient was admitted in the      telemetry and was monitored.  His EKG in the ED was  negative for      any acute cardiac ischemic changes and cardiac enzymes were  negative x3.  Although the patient's EKG and cardiac enzymes were      negative given his chronic medical issues and risk factors, we      thought still cardiac possibility is very likely.  We consulted      Cardiology to rule out cardiac etiology.  Cardiologist did a      resting and stress-induced cardiac Myoviews and cardiac Myoview      showed the findings as mentioned above and cardiac Myoview study is      basically negative for reversible ischemia.  Cardiologist      recommended starting Coreg 6.25 mg b.i.d. and advised to follow up      as an outpatient with his primary care physician.  We consulted      psychiatric for his mood swings, which are contributing to his      chest pain.  Psychiatry had seen and diagnosed him with mood      disorder, otherwise nonspecified.  If the patient continues to have      mood swing, he advised to start Depakote 500 mg p.o. at 6 p.m. in      the evening with followup of CBG and liver function tests.  The      patient during the hospital stay did not have any worsening and did      not have any further episodes of chest pain.  The patient is to      follow up with the Outpatient Clinic at [facility] on      March 16, 2008, with Dr. Vassie Loll.  Dr. [name] is to reassess      any further episodes of any chest pain after discharge and needs to      restart Depakote if there is any worsening.   1. Diabetes type 2.  The patient's CBGs during the entire hospital      stay were fairly well controlled and the patient is to follow up      with Outpatient Clinic at [facility] as needed.  2. Hypertension.  The patient's blood pressures during the entire      hospital stay were fairly well controlled.  Cardiologist      recommended starting Coreg 6.25 mg b.i.d.  3. Hyperlipidemia.  We continued his statins during the      hospitalization and his fasting  lipid profile during the admission      showed LDL of 58, which is at goal and HDL of 31.  The patient is      to take Lipitor 40 mg and is to follow up with Outpatient Clinic      and is to recheck his fasting lipid panel every 3 months.   DISCHARGE VITAL SIGNS   Temperature 98.0, blood pressure 160/63, pulse rate of 66, respiratory  rate 20, and oxygen saturation 98% on 2 liters.  The patient on the day  of discharge is alert and oriented and is not in any acute distress.  The patient did not have any further episodes of chest pain and is to  follow up with Outpatient Clinic at [facility] on March 16, 2008, with Dr. Vassie Loll.  The patient is to take all the  medications as mentioned in the discharge instructions.      Blondell Reveal, MD  Electronically Signed      C. Ulyess Mort, M.D.  Electronically Signed    VB/MEDQ  D:  03/01/2008  T:  03/02/2008  Job:  [id number]   cc:   Outpatient Clinic

## 2010-06-01 NOTE — H&P (Signed)
[name] [name], [name] [name]                 ACCOUNT NO.:  [id number]   MEDICAL RECORD NO.:  [id number]          PATIENT TYPE:  INP   LOCATION:  3035                         FACILITY:  [facility]   PHYSICIAN:  Melissa L. Ladona Ridgel, MD  DATE OF BIRTH:  16-Feb-1950   DATE OF ADMISSION:  10/06/2005  DATE OF DISCHARGE:                                HISTORY & PHYSICAL   CHIEF COMPLAINT:  He lost his balance, and had slurred speech.   PRIMARY CARE PHYSICIAN:  [facility]   HISTORY OF PRESENT ILLNESS:  The patient is a 60 year old African-American  male with a past medical history of strokes x2.  His wife accompanied him to  the bathroom early Friday morning and noticed that his balance was off and  that his speech was more slurred.  While in the bathroom, he did have what  appeared to be a near-syncopal episode.  The patient's wife put him down on  the floor and over the course of Saturday continued to monitor him.  However, when his speech continued to be slurred she brought him to the  emergency room for further evaluation.  The patient arrived in the emergency  department late Saturday evening and was ultimately evaluated by neurology.  It was felt that he likely had had an acute on chronic stroke.   REVIEW OF SYSTEMS:  Current balance issues.  Slurring of his speech at  baseline but worse during this episode.  He recently had antibiotics for  sinusitis but denied diarrhea or dysuria.  All other review of systems  appears to be negative.   PAST MEDICAL HISTORY:  1. Strokes x2.  2. Diabetes.  3. Bronchitis which is chronic.   PAST SURGICAL HISTORY:  None.   SOCIAL HISTORY:  He denies tobacco or alcohol.   FAMILY HISTORY:  Mom is living with diabetes, dad is living with diabetes.   ALLERGIES:  No known drug allergies.   MEDICATIONS:  1. Ibuprofen 1 tablet t.i.d.  2. Trazodone 50 mg q.h.s.  3. Metformin 500 mg p.o. b.i.d.  4. Methocarbamol 500 mg p.o. b.i.d.  5. Aspirin 81 mg p.o. daily.  6. Pravastatin 40 mg daily.  7. Aggrenox 200/25 mg every 12 hours.  8. Hydrochlorothiazide 25 mg q. a.m.  9. Protonix 40 mg p.o. daily.  10.Lisinopril 10 mg p.o. b.i.d.   PHYSICAL EXAMINATION:  VITAL SIGNS:  Temperature is 97.1, blood pressure  94/56, pulse 70, respiratory rate 18, saturations 96%.  GENERAL:  He is in no acute distress, resting comfortably.  HEENT:  Normocephalic, atraumatic.  Pupils equal, round, and reactive to  light.  Extraocular muscles are intact.  Mucous membranes are moist.  He  seems to have a decreased visual sensation to the left.  He has mild  periorbital edema.  NECK:  No JVD or lymph nodes.  CHEST:  Decreased but fair.  CARDIOVASCULAR:  Regular rate and rhythm.  Positive S1, S2.  No S3, S4.  No  murmurs, rubs, or gallops.  ABDOMEN:  Obese, nontender, nondistended, with positive bowel sounds.  EXTREMITIES:  Show no clubbing, cyanosis, or edema.  NEUROLOGIC:  Power is 5/5 in all extremities with 3+ to 4 in the left upper  extremity.  Cranial nerves, II through XII, appear to be intact although  there may be a visual deficit on the left.  Plantars are downgoing.  He has  mild slurred speech.   LABORATORY AND DIAGNOSTICS:  MRI shows chronic inferior parietal stroke with  acute extension both laterally and superiorly to the old infarction.  There  are multiple chronic infarcts.  The MCA posteriorly appears occluded, and  there is severe stenosis of the left cavernous sinus.  Urinalysis is  negative.  His white count was 4.3, hemoglobin 16.1, hematocrit 05.2, and  platelets of 138.  Sodium is 134, potassium 3.4, chloride is 96, CO2 is 29,  BUN is 47, creatinine 0.2, glucose is 84.  LFTs are within normal limits.  His EKG is 73 beats per minute with no ST-T wave changes.  T waves are  downgoing in leads V5 and V6.   ASSESSMENT AND PLAN:  This is a 60 year old African-American male with  previous history of stroke, who presents with slurred speech and gait   disturbance.  1. Neuro.  Consultation was reviewed by Dr. Nash Shearer.  Plan is to continue      his Aggrenox and aspirin.  Obtain a PT/OT evaluation, carotid Dopplers,      and a 2-D echo.  2. Acute renal failure, likely multifactorial.  I will gently rehydrate,      check a spot sodium and creatinine to potentially calculate his FENA .      We will ultrasound his kidneys.  His urinalysis is negative.  We will      continue to monitor his creatinine.  3. Cardiovascular.  Mild hypertension.  A fluid bolus of 100 mL will be      given at this time and he will be further monitored.  4. Pulmonary.  He has no complaints.  A chest x-ray should be obtained to      assess for congestive heart failure.  5. Gastrointestinal.  A stroke swallowing evaluation should be obtained      for dysphagia and then evaluation for a potential diet should be      undertaken by the primary physician.  6. Endocrine.  We will check a TSH and his hemoglobin A1c.  7. Neurologic.  Acute on chronic stroke.  Not eligible for tPA.  Please      see #1, and obtain stroke labs.  8. Deep venous thrombosis prophylaxis will be with Lovenox.      Melissa L. Ladona Ridgel, MD  Electronically Signed     MLT/MEDQ  D:  10/06/2005  T:  10/07/2005  Job:  [id number]

## 2010-06-01 NOTE — H&P (Signed)
NAMEADMIR, [name] [name] NO.:  [id number]   MEDICAL RECORD NO.:  [id number]          PATIENT TYPE:  IPS   LOCATION:  4029                         FACILITY:  [facility]   PHYSICIAN:  Ellwood Dense, M.D.   DATE OF BIRTH:  1950-05-01   DATE OF ADMISSION:  05/03/2005  DATE OF DISCHARGE:                                HISTORY & PHYSICAL   PRIMARY CARE PHYSICIAN:  None.   NEUROLOGIST:  Dr. Thad Ranger.   HISTORY OF PRESENT ILLNESS:  Mr. Borsuk is a 60 year old African-American male  with a history of tobacco abuse. He was admitted April 29, 2005 with altered  mental status and systolic blood pressure of 160 to 180. Cranial CT showed  subacute infarct in the right parietal area as well as the right thalamus.  Echocardiogram showed an ejection fraction of 55% without evidence of  thrombi. MRI study was not done secondary to ethanol withdrawal and  restlessness. Transcranial Dopplers were also not done as he was  uncooperative. Neurology saw the patient, and Dr. Pearlean Brownie recommend Aggrenox  for stroke prophylaxis. He was placed on subcu Lovenox for DVT prophylaxis.  He has required side rails up x4 and a waist belt for safety as he tends to  try to be ambulatory. He has been started on phenobarbital for ethanol  withdrawal, and that was completed. Blood pressure has been monitored on  hydrochlorothiazide and Normodyne. His homocysteine level came back at 16.5,  and he was started on folic acid. His cholesterol level came back at 184,  and Zocor was added.   The patient was evaluated by the rehabilitation physicians and felt to be an  appropriate candidate for inpatient rehabilitation.   REVIEW OF SYSTEMS:  Noncontributory.   PAST MEDICAL HISTORY:  Denied.   FAMILY HISTORY:  The patient denies any medical problems in his family.   SOCIAL HISTORY:  The patient lives with his wife. He previously worked full  time in unknown occupation. He was positive for alcohol along with  tobacco  and marijuana on admission. His wife works days per chart. There is  questionable extended family in the area.   FUNCTIONAL HISTORY PRIOR TO ADMISSION:  Independent.   ALLERGIES:  No known drug allergies.   MEDICATIONS PRIOR TO ADMISSION:  None.   LABORATORY DATA:  Recent labs showed a hemoglobin of 12.9, hematocrit of  39.9, platelet count 190,000 and white count of 6.6. Recent hemoglobin A1c  was 6.4. Recent sodium 138, potassium 4.2, chloride 104, CO2 25, BUN 15,  creatinine 1.3.   PHYSICAL EXAMINATION:  GENERAL:  Well-appearing, well-nourished, adult male  who is minimally conversant.  VITAL SIGNS:  Blood pressure was 120/70 with pulse of 80, respiratory rate  18 and temperature 98.0.  HEENT:  Normocephalic, atraumatic.  CARDIOVASCULAR:  Regular rate and rhythm. S1 and S2 without murmurs.  ABDOMEN:  Soft, obese, nontender with positive bowel sounds.  LUNGS:  Clear to auscultation bilaterally.  NEUROLOGICAL:  Alert, oriented x2 to 3 questionably. He is minimally  conversant despite numerous questions. He will occasionally blurt out  answers but does so  inconsistently. He seems to understand speech well.  Right upper and right lower extremity strength were generally 4+ to 5-/5.  Left upper extremity and left lower extremity strength were generally 4/5.  Bulk was normal, and reflexes were 2+ and symmetrical throughout the  bilateral upper and lower extremities.   IMPRESSION:  1.  Status post right parietal/thalamic ischemic stroke with left sided      weakness.  2.  Dysarthria/aphasia.   Presently, the patient has deficits in ADLs, transfers, ambulation,  cognition and speech secondary to his right parietal/thalamic stroke.   PLAN:  1.  Admit to the rehabilitation unit for daily OT and PT and speech      therapies to advance ADLs, transfers and ambulation along with cognition      and speech abilities, both receptively and expressively.  2.  Twenty-four nursing  for medication administration and monitoring of      vitals.  3.  Check admission labs including CBC and CMET Monday, May 06, 2005.  4.  Continue subcu Lovenox for DVT prophylaxis.  5.  Case manager to assist in finding a primary care physician.  6.  Aggrenox 1 tablet p.o. b.i.d.  7.  Monitor hypertension on hydrochlorothiazide 25 mg per day and Normodyne      100 mg b.i.d.  8.  Continue Zocor 20 mg daily for dyslipidemia.  9.  Continue Foltx 1 tablet daily for elevated homocysteine level.  10. Continue thiamine 100 mg p.o. daily for ethanol abuse history.   PROGNOSIS:  Fair/good.   ESTIMATED LENGTH OF STAY:  Is 7 to 12 days.   GOALS:  Modified independent, ADLs, transfers, ambulation and improvement in  receptive and expressive speech. Improved swallow from D3 nectar consistency  at present.           ______________________________  Ellwood Dense, M.D.     DC/MEDQ  D:  05/03/2005  T:  05/03/2005  Job:  [id number]

## 2010-06-01 NOTE — Discharge Summary (Signed)
[name] [name], [name] [name]                 ACCOUNT NO.:  [id number]   MEDICAL RECORD NO.:  [id number]          PATIENT TYPE:  INP   LOCATION:  3034                         FACILITY:  [facility]   PHYSICIAN:  Pramod P. Pearlean Brownie, MD    DATE OF BIRTH:  [birth year]   DATE OF ADMISSION:  04/29/2005  DATE OF DISCHARGE:  05/03/2005                                 DISCHARGE SUMMARY   DISCHARGE DIAGNOSES:  1.  Right parietal infarct, etiology unclear though likely small vessel      disease.  2.  Hypertension.  3.  Dyslipidemia.  4.  Alcohol abuse.  5.  Tobacco abuse.  6.  Hyperglycemia.  7.  Hyperhomocysteinemia.   DISCHARGE MEDICATIONS:  1.  Foltx one a day.  2.  Lipitor 10 mg a day.  3.  Lovenox 40 mg subcu a day.  4.  Hydrochlorothiazide 25 mg a day.  5.  Labetalol 100 mg b.i.d.  6.  Multivitamin a day.  7.  Aggrenox b.i.d.  8.  Tylenol 650 mg 30 minutes before Aggrenox, started April 30, 2005 x1      week.   STUDIES PERFORMED:  1.  CT of the brain, on April 27, 2005 at [facility], shows a subacute      infarct right parietal lobe and right thalamus, chronic-appearing      bilateral basal ganglia lacunes.  2.  Followup CT on April 30, 2005, shows extension of right parietal infarct      without hemorrhage or mass effect, stable right thalamic infarct.  3.  Chest x-ray shows cardiomegaly without acute disease.  4.  __________  EKG shows normal sinus rhythm.  5.  Carotid Doppler completed, results not available.  6.  Transcranial Doppler unable to be completed.  7.  A 2D echocardiogram shows an EF of 55% with no cardioembolic source.   LABORATORY STUDIES:  Show hemoglobin 12.9, hematocrit 39.9, MCV 76.4, MCHC  32.3, RDW 15.0, and platelets 190, white blood cells are 6.6.  Differential  is normal.  Coagulation studies are normal.  Chemistry with glucose 109,  otherwise normal.  Liver function tests normal.  Hemoglobin A1c 6.4.  Homocystine 16.5.  Cholesterol 184, triglycerides 136, HDL 45, and  LDL 112.  Urinalysis showed 7-10 white blood cells, 7-10 red blood cells, small  leukocyte esterase, and a few bacteria.  [name] [name] grew 5,000 colonies of  insignificant growth.   HISTORY OF PRESENT ILLNESS:  This is initial evaluation of a [name] [name],  right handed, African American male with no known medical history and no  regular medical care.  The patient was reportedly driving earlier the  afternoon of admission and seemed to be normal until [name] [name] took a nap.  When [name] [name]  woke up at his house about 5 p.m., [name] [name] was noted to be acting strangely.  For  example, [name] [name] put his pants on backwards, and [name] [name] seemed to have difficulty  buttoning his shirt.  His speech was quite a bit slurred but for the most  part was appropriate.  [name] [name] then went to a social event, was noted by  several  people to be looking not right.  [name] [name] was also having some difficulty using  his utensils and had to have his food cut up for him.  Eventually, secondary  to his strange behavior, [name] [name] was brought to the [facility].  In the emergency room, [name] [name] was found to have markedly elevated blood pressure  but no focal neurologic symptoms.  A code stroke was called and a CT of the  head demonstrated a subacute right MCA stroke.  Neurology evaluation was  requested and the patient was admitted to the hospital for further stroke  evaluation.   HOSPITAL COURSE:  The patient spent approximately two days at [facility] before being able to transfer to the stroke service at [facility].  [name] [name] was initially placed on a labetalol drip with blood pressure  parameters 140 to 160.  Blood pressure did drop less than set goals in the  120 range and the patient had some slight neurologic worsening.  Followup CT  did show evolution of the right brain stroke.  Approximately day three of  hospitalization, the patient became agitated, restless, and unable to  cooperate with exam.  Per wife, the patient was a heavy alcohol drinker  at  home and [name] [name] was placed on phenobarbital protocol for withdrawal.  This did  help him settle down but we were unable to complete entire workup, most  notably the transcranial Doppler.  [name] [name] was found to have vascular risk  factors of tobacco for which [name] [name] was counseled to stop as well as  hyperlipidemia for which [name] [name] was started on Lipitor and hyperhomocysteinemia  for which [name] [name] was started on Foltx.  His hemoglobin A1c was also elevated but  this can be diet-controlled.  In the hospital, [name] [name] continued with significant  left hemiparesis.  [name] [name] was evaluated by PT and OT and felt to a good rehab  candidate.  Arrangements were made for transfer to rehab.  [name] [name] was placed on  Aggrenox for secondary stroke prevention and was tolerating that well in the  hospital.   CONDITION ON DISCHARGE:  GENERAL:  The patient lethargic. [name] [name] does awaken to  voice.  [name] [name] is dysarthric but has no aphasia.  [name] [name] will follow simple  commands.  HEENT:  His face is symmetric, and his extraocular movements are intact.  CHEST:  Clear to auscultation.  HEART:  Rate is regular.  NEUROLOGIC:  [name] [name] has mild left hemiparesis, 4/5 on the left and right  strength is normal.  [name] [name] has decreased fine motor movement on the left and  decreased sensation on the left.  [name] [name] has decreased finger-to-nose-finger and  ataxia on the left side making gait very unstable.   DISCHARGE PLAN:  1.  Transfer to rehab for continuation of PT, OT, and speech therapy.  2.  Aggrenox for secondary stroke prevention.  3.  New [place].  4.  [name] [name] is to follow up in four to six weeks.  5.  Follow up glucose with treatment if needed after discharge.  The patient      needs to get a primary care physician and follow up within one month      after discharge.  6.  Follow up with Dr. [name] in two to three months.      Annie Main, N.P.    ______________________________ Sunny Schlein. Pearlean Brownie, MD    SB/MEDQ  D:  05/03/2005  T:  05/04/2005  Job:  [id number]  cc:   Pramod P. Pearlean Brownie, MD  Fax: [phone]

## 2010-06-01 NOTE — Consult Note (Signed)
NAMEANDREE, GOLPHIN                 ACCOUNT NO.:  [id number]   MEDICAL RECORD NO.:  [id number]          PATIENT TYPE:  EMS   LOCATION:  MAJO                         FACILITY:  [facility]   PHYSICIAN:  Bevelyn Buckles. Champey, M.D.DATE OF BIRTH:  May 13, 1950   DATE OF CONSULTATION:  DATE OF DISCHARGE:                                   CONSULTATION   REFERRING PHYSICIAN:  Shelda Jakes, MD   REASON FOR CONSULTATION:  Stroke.   HISTORY OF PRESENT ILLNESS:  Mr. Yankee is a 60 year old African American male  with multiple medical problems and he presents with a 1-day history of  feeling of imbalance, left facial droop, and slurred speech.  The patient  has a prior history of CVA with residual left upper extremity weakness.  Wife states that, Friday evening and Saturday morning, patient was extremely  unstable to where she needed to help him up.  She also noted that his left  face was drawn and drooped and patient was slurring his words.  The patient  had just recently restarted lisinopril secondary to elevated blood pressure.  Patient denied any headaches, new weakness, new numbness, dizziness, vision  changes, swallowing problems, chewing problems, falls, or loss of  consciousness.   PAST MEDICAL HISTORY:  Positive for hypertension, stroke, diabetes, high  cholesterol.   CURRENT MEDICATIONS:  Aggrenox, baby aspirin, pravastatin,  hydrochlorothiazide, lisinopril, methocarbamol, Protonix, trazodone,  metformin.   ALLERGIES:  NO KNOWN DRUG ALLERGIES.   FAMILY HISTORY:  Positive for diabetes.   SOCIAL HISTORY:  The patient lives with his wife.  No smoking or alcohol.   REVIEW OF SYSTEMS:  Review of systems positive as per HPI.  Review of  systems negative as per HPI and greater than 8 other organ systems.   PHYSICAL EXAMINATION:  VITAL SIGNS:  Temperature 99.6, blood pressure 131/80  to 94/56, pulse 92, O2 sat is 95% to 100%.  HEENT:  Normocephalic, atraumatic.  Extraocular muscles are  intact.  Pupils  are equal, round, and reactive to light.  Patient does have a left visual  field cut.  NECK:  Supple.  No carotid bruits.  HEART:  Regular.  LUNGS:  Clear.  ABDOMEN:  Soft, nontender.  EXTREMITIES:  No edema.  Good pulses.  NEUROLOGIC:  Patient is awake, alert, following commands appropriately.  Cranial nerves:  Patient has left visual field cut and left facial droop  noted.  The rest of the cranial nerves II-XII are grossly intact.  MOTOR:  Patient has left lower extremity weakness at 3+ to 4 out of 5  strength.  The rest of the extremities show 4+ to 5 out of 5 strength and  normal tone.  SENSORY:  Within normal limits to light touch.  Reflexes are 1+ to 2+  throughout.  Toes are neutral bilaterally.  Cerebellar function is within  normal limits.  Good finger to nose.  Gait was not assessed secondary to  safety.   MRI/MRA of the brain showed acute extension of prior right parietal lobe  infarct, multiple chronic infarcts.  Patient also had an occluded right  posterior MCA branches, which is seen on prior films, and left ICA stenosis.   LABORATORY DATA:  WBC 4.3, hemoglobin 16.1, hematocrit 50.2, platelets  138,000.  Sodium 134, potassium 3.4, chloride 96, CO2 29, BUN 47, creatinine  2.2, glucose 84, calcium is 9.7.  LFTs are within normal limits.   IMPRESSION:  A 60 year old [place] American male with multiple medical  problems who presents with imbalance, facial droop, and slurred speech.  On  MRI and MRA was found to have small extension of previous stroke.  Patient  is not a candidate for IV TPA as he is greater than 3 hours out from onset  of symptoms.  Patient seems to have numerous medical problems and issues  including new renal insufficiency with elevated BUN and creatinine.  Would  recommend checking 2-D echocardiogram, carotid Dopplers, transcranial  Dopplers.  Check lipids and homocystine level.  I will continue the patient  on Aggrenox and aspirin for  now, and his other home medications as well.  Get PT/OT consults and keep the patient n.p.o. until he clears swallow  evaluation.  Place the patient on DVT prophylaxis.   We will follow the patient on the stroke consult service.      Bevelyn Buckles. Nash Shearer, M.D.  Electronically Signed     DRC/MEDQ  D:  10/06/2005  T:  10/06/2005  Job:  [id number]

## 2010-06-01 NOTE — Discharge Summary (Signed)
NAMEZEBEDEE, SEGUNDO                 ACCOUNT NO.:  [id number]   MEDICAL RECORD NO.:  [id number]          PATIENT TYPE:  INP   LOCATION:  3035                         FACILITY:  [facility]   PHYSICIAN:  Deirdre Peer. Polite, M.D. DATE OF BIRTH:  1950/01/18   DATE OF ADMISSION:  10/05/2005  DATE OF DISCHARGE:                                 DISCHARGE SUMMARY   DISCHARGE DIAGNOSES:  1. Acute cerebrovascular accident, MRI showing extensive right parietal      stroke, also showing moderate to advanced chronic ischemic changes in      the basal ganglia bilateral.  2. Acute renal failure, improved, creatinine on admission 3.2, probably      secondary to his medications, i.e., ibuprofen, lisinopril, HCT and      metformin.  Please note renal ultrasound was in normal limits.  Will      require further outpatient followup and avoidance of above medications      until stability has been documented.  3. Diabetes, well controlled, overall hemoglobin A1c 6.4.  Please note      Glucophage will be placed on hold until renal dysfunction is improved,      __________ has been added 2.5 daily.  4. Hypertension, controlled.  5. Anemia.  Admission hemoglobin was 16.  Followup hemoglobin was 9.6, no      signs of bleeding. Question of hemodilution __________.  Of note, the      patient has a history of anemia and low MCV, suggesting a component of      deficiency. No heme positive stools have been identified during this      hospitalization.  __________ has had history of ETOH use, cannot      exclude ETOH induced gastritis, __________.  Will request colonoscopy      on an outpatient basis.  6. Hypercholesterolemia.  __________.  7. Relative hypotension.  __________  will be added.   DISCHARGE MEDICATIONS:  1. __________.  2. Aspirin 81 mg daily.  3. Aggrenox one every 12 hours.  4. __________.  5. Glyburide 2.5 mg daily.  6. __________.   DISPOSITION:  The patient is being discharged home in stable  condition.   FOLLOW UP:  He is to [facility] up with [facility] __________  in six  weeks.   The patient has been updated on the importance of __________ medications and  __________ follow up in approximately one week.   CONSULTANTS:  Bevelyn Buckles. Nash Shearer, M.D. , Neurology.   STUDIES:  Admission creatinine 2.1, at discharge creatinine 1.8.  Admission  hemoglobin 16, at discharge 9.6. Homocystine 21.8.  A 2-D echocardiogram  revealed LV systolic function at the lower limits of normal.  LVEF was  estimated to be 50%,  study was inadequate for evaluation of left  ventricular regional wall motion, left ventricular wall thickness.  __________ normal mitral valve leaflet excursion.  Compared with April of  2007, there has been no significant change.  __________  Renal ultrasound no  acute abnormalities.  Carotids with no stenosis.  Hemoglobin A1c was 6.4.  Total cholesterol 126.  MRI  shows right parietal stroke, __________ advanced  chronic ischemic changes of the __________ bilaterally.  MRA with __________  included.   HISTORY OF PRESENT ILLNESS:  A 60 year old male seen in evaluation for the  chief complaint of loss of balance and slurred speech.  The patient was  evaluated, initially was __________ for evaluation and treatment.  Please  see detailed History and Physical for details.   PAST MEDICAL HISTORY:  Per admission H&P.   PAST SURGICAL HISTORY:  None.   SOCIAL HISTORY:  Denies any tobacco or alcohol.   FAMILY HISTORY:  Positive for diabetes mellitus.   ALLERGIES:  No known drug allergies.   HOSPITAL COURSE:  Problem#1:  Acute CVA.  The patient's exam was consistent  with a left visual field deficit, left upper extremity weakness, grade 3-4/5  and left lower extremity weakness approximately 4-5/5.  The patient was seen  by neurology, as well as speech and PT.  The patient did not require any  assistive devices.  __________  Problem 2:  Acute renal failure.  The creatinine  was elevated on admission  at 3.2, at discharge was 1.8.  His renal ultrasound was within normal  limits.  It was felt that this was more related to decreased perfusion  __________  ibuprofen 800 mg b.i.d., lisinopril, hydrochlorothiazide as well  as __________.  It is expected that patient's renal dysfunction will improve  back to baseline.  In the interim,  the patient is asked to avoid the  following medications until stability of his creatinine.  I gave the list to  he and his wife.  Problem 3: Anemia.  The patient's hemoglobin on admission was 16, follow up  hemoglobin 9.6.  There was no sign of blood loss.  It is wondered if this is  a dilutional effect as the patient has received significant IV fluids during  his hospitalization.  As stated, the patient has had anemia before and his  MCV is low.  No Hemoccult has been identified during this hospitalization.  __________ .  Will require an outpatient colonoscopy at some point in time,  however at this time it is not indicated.  It is recommended that the  patient have a follow up CBC in one week.  Problem 4: Hypertension, stable.   The patient has other medical problems which are stable at this time.  Continue medications as outlined above,  Will require outpatient follow up  __________ as well as the neurologist.  At this time, the patient is stable  for discharge.      Deirdre Peer. Polite, M.D.  Electronically Signed     RDP/MEDQ  D:  10/08/2005  T:  10/08/2005  Job:  [id number]   cc:   [facility]

## 2010-06-01 NOTE — Assessment & Plan Note (Signed)
[name] [name] is back regarding his right parietal and thalamic cerebrovascular  accident.  Yancarlos apparently was admitted back to the hospital about 2 weeks  ago with a recurrent stroke.  I am not privy to the details of the  admission, although this may have been a small brain stem stroke.  He was in  the hospital for about 3 or 4 days and was discharged back to home with  outpatient therapies.  His wife notes that he did have hyperglycemia but was  not sent home with a machine.  He was placed on Glucophage 500 mg a day.  In  general, [name] [name] has been doing well since he has been home.  He still has  had some lightheadedness, particularly when he changes positions.  He  describes it as a wooziness but not a frank vertigo.  Symptoms usually  resolve once he is up for a few seconds.  He will sit down when he needs to.  His wife reports decreased impulsiveness and better judgment in general.  He  remains at home due to the fact that she lost her job.   The patient has been involved in physical and, I believe, occupational  therapy where they are working on his balance, as well as range of motion.  He has had some mild left shoulder pain since Therapy has been working with  is range and movement.  The patient is sleeping fairly well.  He does report  some tingling in his hands still, left greater than right.  Mood has been  fair to good.   REVIEW OF SYSTEMS:  Pertinent positives are listed above.  The patient does  report a history of high blood sugars but has not been able to check them at  home due to not having a machine.  Full review is in the health and history  section.   SOCIAL HISTORY:  The patient is married and living with his wife, who is  very supportive.   PHYSICAL EXAMINATION:  VITAL SIGNS:  Blood pressure 152/76, pulse 68,  respiratory rate 17.  He is satting 98% on room air.  GENERAL:  The patient is pleasant.  No acute distress.  He is alert and  oriented x 3.  HEART:   Regular rate and rhythm.  LUNGS:  Clear.  ABDOMEN:  Soft and nontender.  NEUROLOGIC:  Affect is generally bright.  Gait is slightly wide based but  generally steady.  Coordination is decreased in the left hand.  He is paying  attention to the left side today.  He is a bit impulsive but much improved  from his hospitalization.  Sensation is decreased somewhat in the hands but  being consistently so.  The patient is able to I.D. several objects upon  questioning.  He is able to follow 1- to 2-step commands.  Motor function is  3+ to 4 out of 5 in the left lower extremity, 4+ out of 5 in left lower  extremity, 4+ to 5 out of 5 on the right.  No pronator drift is seen.  He  does have dysmetria and loss of fine motor coordination in the left arm.  Mild rotator cuff symptoms are seen in the left side as well.   ASSESSMENT:  1.  History of right parietal and thalamic cerebrovascular accident.  2.  History of dysphasia and dysarthria.  3.  Hypertension.  4.  Non-insulin-requiring diabetes, uncontrolled.   PLAN:  1.  We talked at length about the importance  of monitoring closely his blood      pressure, blood sugars, weight, diet, exercise, etc.  The patient      expressed understanding, as did his wife.  They are working on dietary      changes already.  2.  I will have the patient give [facility] a call to see if they can      assist him with a glucose monitor.  He certainly needs to watch his      sugars at home.  3.  We will increase his enalapril to 10 mg b.i.d. to better control his      sugars.  4.  Continue outpatient physical and occupational therapies.  5.  I will see the patient back in 3 months' time.  I asked the wife to call      me if any particular questions come up.  I      did give her permission to leave him home alone unattended for 30      minutes to an hour at a time to see how he does.  She may be able to      expand that time over the next few weeks as  tolerated.      Ranelle Oyster, M.D.  Electronically Signed     ZTS/MedQ  D:  07/23/2005 16:12:12  T:  07/23/2005 17:42:04  Job #:  [id number]   cc:   Dala Dock

## 2010-06-01 NOTE — Assessment & Plan Note (Signed)
[name] [name] is back regarding his right parietal and thalamic strokes.  The  elbow appears to be doing better as well as the shoulder range of  motion.  He has resumed therapy and has been doing well there as well.  He rates his pain as a 4 to 5 out of 10 and describes it as dull.  The  shoulder injection we performed after last visit provided quite a bit of  relief.  He feels that his vision has improved as well.  His wife still  notes problems with inattention on the left side.  The patient wants to  drive.   REVIEW OF SYSTEMS:  The patient reports some coughing.  Mood has been  good.  He is continent of bowel and bladder.  He has been eating well.  His sugars have been under good control as has his blood pressure.   SOCIAL HISTORY:  The patient is married.  He and his wife seem to be  getting along fairly well.   PHYSICAL EXAMINATION:  VITAL SIGNS:  Blood pressure is 128/70, pulse is  70, respiratory rate is 16.  He is sating 98% on room air.  GENERAL:  The patient is pleasant, no acute distress.  He is alert and  oriented x3.  Affect is bright and appropriate.  MUSCULOSKELETAL:  Gait is stable.  Slightly shuffling to the left.  He  still had some inattention to the left side.  Sensation in the arm  remains 1/2.  No significant tone was appreciated.  There was no apraxia  with the left side use.  Strength on the left was 4+ to 5- out of 5 and  on the right side it was 5/5.  Decreased rotator cuff signs were seen  today on the left.  No elbow pain was appreciated.  The patient was able  to read a sign from 10 feet away with good accuracy.  He seemed to be  tracking better on the left today than he had previously.   ASSESSMENT:  1. History of right parietal/thalamic cerebrovascular accident.  2. History of dysphagia and dysarthria.  3. Hypertension.  4. Non-insulin-requiring diabetes.  5. Left rotator cuff tendonitis/adhesive capsulitis which is improved.   PLAN:  1. Maintain  enalapril 10 mg b.i.d.  2. Monitor sugars as he has been doing.  3. Continue therapy to completion to work on balance and range of      motion.  4. No driving at this point, could consider driving evaluation at a      later point as some of his inattention has improved.  5. Continue appropriate diet and exercise.   I will see the patient back in about 4 months' time.  He has done quite  nicely up to this point.      Ranelle Oyster, M.D.  Electronically Signed     ZTS/MedQ  D:  01/03/2006 11:25:36  T:  01/03/2006 23:47:37  Job #:  [id number]

## 2010-06-04 ENCOUNTER — Other Ambulatory Visit: Payer: Self-pay | Admitting: Ophthalmology

## 2010-06-04 ENCOUNTER — Other Ambulatory Visit: Payer: Self-pay | Admitting: Internal Medicine

## 2010-06-04 NOTE — Telephone Encounter (Signed)
Unclear if patient needs to continue on supplementation.  BMETs on record have demonstrated normal potassium for quite some time.  We can do a trial off KCL and recheck on follow up visit, will stop for now.

## 2010-06-05 NOTE — Telephone Encounter (Signed)
Thanks

## 2010-06-05 NOTE — Telephone Encounter (Signed)
Unable to reach pt at # in  Chart.  Pharmacy called and informed.  They will have pt call office for questions when they try to pick up med.

## 2010-06-08 ENCOUNTER — Other Ambulatory Visit (INDEPENDENT_AMBULATORY_CARE_PROVIDER_SITE_OTHER): Payer: Medicare Other | Admitting: *Deleted

## 2010-06-08 DIAGNOSIS — E119 Type 2 diabetes mellitus without complications: Secondary | ICD-10-CM

## 2010-06-08 MED ORDER — GLUCOSE BLOOD VI STRP: ORAL_STRIP | Status: DC

## 2010-06-08 MED ORDER — TRUETRACK BLOOD GLUCOSE W/DEVICE KIT: 1.0000 | PACK | Freq: Three times a day (TID) | Status: DC

## 2010-06-14 ENCOUNTER — Other Ambulatory Visit: Payer: Medicare Other

## 2010-06-14 LAB — BASIC METABOLIC PANEL
BUN: 12 mg/dL (ref 6–23)
Chloride: 105 mEq/L (ref 96–112)
Glucose, Bld: 89 mg/dL (ref 70–99)
Potassium: 4.2 mEq/L (ref 3.5–5.3)

## 2010-07-06 ENCOUNTER — Other Ambulatory Visit: Payer: Self-pay | Admitting: Internal Medicine

## 2010-07-07 ENCOUNTER — Other Ambulatory Visit: Payer: Self-pay | Admitting: Ophthalmology

## 2010-07-23 ENCOUNTER — Encounter: Payer: Self-pay | Admitting: Internal Medicine

## 2010-08-10 ENCOUNTER — Other Ambulatory Visit: Payer: Self-pay | Admitting: Internal Medicine

## 2010-08-14 ENCOUNTER — Other Ambulatory Visit: Payer: Self-pay | Admitting: Internal Medicine

## 2010-09-10 ENCOUNTER — Other Ambulatory Visit: Payer: Self-pay | Admitting: Internal Medicine

## 2010-09-14 ENCOUNTER — Telehealth: Payer: Self-pay | Admitting: *Deleted

## 2010-09-14 ENCOUNTER — Inpatient Hospital Stay (HOSPITAL_COMMUNITY)
Admission: EM | Admit: 2010-09-14 | Discharge: 2010-09-17 | DRG: 885 | Disposition: A | Discharge status: Home / Self Care | Payer: Medicare Other | Attending: Internal Medicine | Admitting: Internal Medicine

## 2010-09-14 ENCOUNTER — Emergency Department (HOSPITAL_COMMUNITY): Payer: Medicare Other

## 2010-09-14 DIAGNOSIS — F4320 Adjustment disorder, unspecified: Secondary | ICD-10-CM | POA: Diagnosis present

## 2010-09-14 DIAGNOSIS — R404 Transient alteration of awareness: Secondary | ICD-10-CM | POA: Diagnosis present

## 2010-09-14 DIAGNOSIS — R4182 Altered mental status, unspecified: Secondary | ICD-10-CM | POA: Diagnosis present

## 2010-09-14 DIAGNOSIS — I10 Essential (primary) hypertension: Secondary | ICD-10-CM | POA: Diagnosis present

## 2010-09-14 DIAGNOSIS — F29 Unspecified psychosis not due to a substance or known physiological condition: Principal | ICD-10-CM | POA: Diagnosis present

## 2010-09-14 DIAGNOSIS — E119 Type 2 diabetes mellitus without complications: Secondary | ICD-10-CM | POA: Diagnosis present

## 2010-09-14 DIAGNOSIS — Z8673 Personal history of transient ischemic attack (TIA), and cerebral infarction without residual deficits: Secondary | ICD-10-CM

## 2010-09-14 LAB — URINALYSIS, ROUTINE W REFLEX MICROSCOPIC
Glucose, UA: NEGATIVE mg/dL
Hgb urine dipstick: NEGATIVE
Leukocytes, UA: NEGATIVE
Specific Gravity, Urine: 1.021 (ref 1.005–1.030)
Urobilinogen, UA: 1 mg/dL (ref 0.0–1.0)

## 2010-09-14 LAB — DIFFERENTIAL
Basophils Relative: 0 % (ref 0–1)
Eosinophils Absolute: 0.1 10*3/uL (ref 0.0–0.7)
Eosinophils Relative: 1 % (ref 0–5)
Monocytes Absolute: 0.5 10*3/uL (ref 0.1–1.0)
Monocytes Relative: 9 % (ref 3–12)
Neutrophils Relative %: 47 % (ref 43–77)

## 2010-09-14 LAB — COMPREHENSIVE METABOLIC PANEL
ALT: 19 U/L (ref 0–53)
AST: 27 U/L (ref 0–37)
Albumin: 4 g/dL (ref 3.5–5.2)
Calcium: 9.4 mg/dL (ref 8.4–10.5)
Sodium: 141 mEq/L (ref 135–145)
Total Protein: 7.7 g/dL (ref 6.0–8.3)

## 2010-09-14 LAB — RAPID URINE DRUG SCREEN, HOSP PERFORMED
Amphetamines: NOT DETECTED
Cocaine: NOT DETECTED
Opiates: NOT DETECTED
Tetrahydrocannabinol: NOT DETECTED

## 2010-09-14 LAB — CBC
MCH: 25.4 pg — ABNORMAL LOW (ref 26.0–34.0)
MCHC: 33 g/dL (ref 30.0–36.0)
Platelets: 124 10*3/uL — ABNORMAL LOW (ref 150–400)
RBC: 4.64 MIL/uL (ref 4.22–5.81)
RDW: 16.4 % — ABNORMAL HIGH (ref 11.5–15.5)

## 2010-09-14 NOTE — Telephone Encounter (Signed)
He has not been seen in clinic since mid April so no recent medication changes are likely.  He is diabetic so could be low blood sugar and this should be checked in the ED but unlikely to be symptomatic for 6 days if it is secondary to a low blood sugar.  Agree with the need for [name] [name] to be seen immediately in the ED.
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
## 2010-09-15 ENCOUNTER — Encounter: Payer: Self-pay | Admitting: Ophthalmology

## 2010-09-15 LAB — GLUCOSE, CAPILLARY
Glucose-Capillary: 87 mg/dL (ref 70–99)
Glucose-Capillary: 94 mg/dL (ref 70–99)
Glucose-Capillary: 87 mg/dL (ref 70–99)

## 2010-09-15 LAB — CK TOTAL AND CKMB (NOT AT ARMC)
Relative Index: 0.6 (ref 0.0–2.5)
Total CK: 523 U/L — ABNORMAL HIGH (ref 7–232)

## 2010-09-15 LAB — TROPONIN I: Troponin I: <0.3 ng/mL (ref <0.30)

## 2010-09-15 LAB — RPR: RPR Ser Ql: NONREACTIVE
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
## 2010-09-16 DIAGNOSIS — F39 Unspecified mood [affective] disorder: Secondary | ICD-10-CM

## 2010-09-16 DIAGNOSIS — Z8673 Personal history of transient ischemic attack (TIA), and cerebral infarction without residual deficits: Secondary | ICD-10-CM

## 2010-09-16 DIAGNOSIS — F29 Unspecified psychosis not due to a substance or known physiological condition: Secondary | ICD-10-CM

## 2010-09-16 LAB — GLUCOSE, CAPILLARY
Glucose-Capillary: 81 mg/dL (ref 70–99)
Glucose-Capillary: 89 mg/dL (ref 70–99)
Glucose-Capillary: 76 mg/dL (ref 70–99)

## 2010-09-18 ENCOUNTER — Other Ambulatory Visit: Payer: Self-pay | Admitting: Internal Medicine

## 2010-09-18 ENCOUNTER — Encounter: Payer: Medicare Other | Admitting: Internal Medicine

## 2010-09-20 ENCOUNTER — Telehealth: Payer: Self-pay | Admitting: *Deleted
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
## 2010-09-21 ENCOUNTER — Telehealth: Payer: Self-pay | Admitting: Licensed Clinical Social Worker

## 2010-09-25 ENCOUNTER — Ambulatory Visit (INDEPENDENT_AMBULATORY_CARE_PROVIDER_SITE_OTHER): Payer: Medicare Other | Admitting: Internal Medicine

## 2010-09-25 ENCOUNTER — Encounter: Payer: Self-pay | Admitting: Internal Medicine

## 2010-09-25 DIAGNOSIS — I6350 Cerebral infarction due to unspecified occlusion or stenosis of unspecified cerebral artery: Secondary | ICD-10-CM

## 2010-09-25 DIAGNOSIS — G4700 Insomnia, unspecified: Secondary | ICD-10-CM

## 2010-09-25 DIAGNOSIS — E119 Type 2 diabetes mellitus without complications: Secondary | ICD-10-CM

## 2010-09-25 DIAGNOSIS — N189 Chronic kidney disease, unspecified: Secondary | ICD-10-CM

## 2010-09-25 DIAGNOSIS — D649 Anemia, unspecified: Secondary | ICD-10-CM

## 2010-09-25 DIAGNOSIS — I10 Essential (primary) hypertension: Secondary | ICD-10-CM

## 2010-09-25 DIAGNOSIS — E785 Hyperlipidemia, unspecified: Secondary | ICD-10-CM

## 2010-09-25 DIAGNOSIS — I639 Cerebral infarction, unspecified: Secondary | ICD-10-CM

## 2010-09-25 DIAGNOSIS — Z23 Encounter for immunization: Secondary | ICD-10-CM

## 2010-09-25 DIAGNOSIS — F3289 Other specified depressive episodes: Secondary | ICD-10-CM

## 2010-09-25 DIAGNOSIS — K219 Gastro-esophageal reflux disease without esophagitis: Secondary | ICD-10-CM

## 2010-09-25 DIAGNOSIS — F329 Major depressive disorder, single episode, unspecified: Secondary | ICD-10-CM

## 2010-09-25 LAB — POCT GLYCOSYLATED HEMOGLOBIN (HGB A1C): Hemoglobin A1C: 6

## 2010-09-25 MED ORDER — HYDROXYZINE HCL 50 MG PO TABS: 50.0000 mg | ORAL_TABLET | Freq: Every evening | ORAL | Status: DC | PRN

## 2010-09-25 MED ORDER — METFORMIN HCL 500 MG PO TABS: 500.0000 mg | ORAL_TABLET | Freq: Two times a day (BID) | ORAL | Status: DC

## 2010-09-25 MED ORDER — AMLODIPINE BESYLATE 10 MG PO TABS: 10.0000 mg | ORAL_TABLET | Freq: Every day | ORAL | Status: DC

## 2010-09-25 MED ORDER — ATORVASTATIN CALCIUM 40 MG PO TABS: 40.0000 mg | ORAL_TABLET | Freq: Every day | ORAL | Status: DC

## 2010-09-25 MED ORDER — ASPIRIN-DIPYRIDAMOLE ER 25-200 MG PO CP12: 1.0000 | ORAL_CAPSULE | Freq: Two times a day (BID) | ORAL | Status: DC

## 2010-09-25 MED ORDER — OMEPRAZOLE 20 MG PO CPDR: 20.0000 mg | DELAYED_RELEASE_CAPSULE | Freq: Every day | ORAL | Status: DC

## 2010-09-25 MED ORDER — LOSARTAN POTASSIUM 100 MG PO TABS: 100.0000 mg | ORAL_TABLET | Freq: Every day | ORAL | Status: DC

## 2010-09-25 NOTE — Assessment & Plan Note (Signed)
Patient's wife notes that he does not sleep well at night, which is why he has been prescribed amitriptyline.  She notes that the amitriptyline is  Ineffective.    Plan:  Add hydroxyzine, as patient also complains of his hands feeling itchy.   Improve sleep hygiene by eliminating day time naps and avoiding activities other than sleep while in bed.  Continue amitriptyline.   Patient's wife was instructed to call if she noticed any abnormal reaction to hydroxyzine or if she feels that this new regimen is ineffective.

## 2010-09-25 NOTE — Assessment & Plan Note (Signed)
Patient is compliant with lipitor. Meds were refilled today.  Patient has not had a fasting lipid panel in over 1 year.  Plan: Continue lipitor Return prior to next visit for blood draw for fasting lipid panal.  Patient has been at goal in the past.

## 2010-09-25 NOTE — Assessment & Plan Note (Signed)
Well controlled. No active issues. Compliant with medications.  Medications refilled at this visit.  Plan: Continue amlodipine & losartan

## 2010-09-25 NOTE — Telephone Encounter (Signed)
Spoke with Mrs. Spainhower and advised her I would give resource information to her nurse for appmt this PM.   Ms. Tienda said her husband's behaviors had improved.  She is also unsure whether he might have some dementia.   Mrs. Sawin was thankful for the call and concern.

## 2010-09-25 NOTE — Assessment & Plan Note (Signed)
Patient had a CBC drawn during recent hospitalization in early September.  Though patient has been chronically anemic in the past, he has been normocytic.  MCV at hospitalization suggests microcytic anemia.  Folate & B12 levels were normal during hospitalization, so there is an unlikely a component of macrocytic anemia.     Plan: Patient to schedule colonoscopy. If by next visit patient has not had colonoscopy, we will do a bedside fecal occult test. Blood work for iron deficiency anemia done today.
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
## 2010-09-25 NOTE — Patient Instructions (Addendum)
-  Thank you for getting your flu and tetanus shots today!  -Please schedule follow up appointment for your diabetes for about 3 months from today.  -Please come to have blood work drawn to check your cholesterol before this visit, preferably in the next week or two.  Please stay fasting for this blood draw.  -Please continue all of your home medications.  Refills have been sent to your pharmacy.  -Please schedule your colonoscopy screening.  -Please schedule your annual eye exam.  Thank you!

## 2010-09-25 NOTE — Assessment & Plan Note (Signed)
No complaints of depression feelings or suicidal ideation at this time.  Patient and wife report that he has never been on antidepressants.  Patient does report that sometimes he feels "low", but is not interested in seeking psychiatric help at this time.  He did receive a psychiatric consult during hospitalization which did not suggest he needed any acute intervention at that time, but did diagnose him with Mood disorder NOS as well as adjustment disorder and early stage dementia.

## 2010-09-25 NOTE — Assessment & Plan Note (Signed)
Renal function seems to be well managed at this time.  His renal function has been normal over the last 2 years with 1 creatinine reading of 1.7 four months ago.  His renal function at hospitalization was BUN=17, Cr=1.17  I am not repeating a BMET at this time.  Plan: obtain urine microalbumin to evaluate for diabetic nephropathy.

## 2010-09-25 NOTE — Assessment & Plan Note (Addendum)
Patient is compliant with metformin.  No new problems.  Patient has lost glucometer.  Metformin refilled at this visit.  HbA1c at goal currently.  Plan: Continue metformin Patient will schedule annual eye exam. Will return for fasting lipid panal. Patient has had diabetic foot exam this year. Patient had flu shot and tetanus shot today Patient will give urine for microalbumin today.  Patient to follow up in 3 months, if findings of today's lab work are abnormal, patient will be notified.

## 2010-09-26 LAB — MICROALBUMIN / CREATININE URINE RATIO
Creatinine, Urine: 212.7 mg/dL
Microalb, Ur: 1.22 mg/dL (ref 0.00–1.89)

## 2010-09-26 LAB — FERRITIN: Ferritin: 241 ng/mL (ref 22–322)

## 2010-09-26 NOTE — Progress Notes (Signed)
agree with plans and notes 
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
## 2010-11-17 ENCOUNTER — Other Ambulatory Visit: Payer: Self-pay | Admitting: Ophthalmology

## 2010-12-17 ENCOUNTER — Telehealth: Payer: Self-pay | Admitting: *Deleted

## 2010-12-17 ENCOUNTER — Ambulatory Visit (INDEPENDENT_AMBULATORY_CARE_PROVIDER_SITE_OTHER): Payer: Medicare Other | Admitting: Internal Medicine

## 2010-12-17 ENCOUNTER — Encounter: Payer: Self-pay | Admitting: Internal Medicine

## 2010-12-17 DIAGNOSIS — I10 Essential (primary) hypertension: Secondary | ICD-10-CM

## 2010-12-17 DIAGNOSIS — F0280 Dementia in other diseases classified elsewhere without behavioral disturbance: Secondary | ICD-10-CM

## 2010-12-17 DIAGNOSIS — G4700 Insomnia, unspecified: Secondary | ICD-10-CM

## 2010-12-17 DIAGNOSIS — E119 Type 2 diabetes mellitus without complications: Secondary | ICD-10-CM

## 2010-12-17 HISTORY — DX: Dementia in other diseases classified elsewhere, unspecified severity, without behavioral disturbance, psychotic disturbance, mood disturbance, and anxiety: F02.80

## 2010-12-17 LAB — GLUCOSE, CAPILLARY: Glucose-Capillary: 108 mg/dL — ABNORMAL HIGH (ref 70–99)

## 2010-12-17 MED ORDER — RISPERIDONE 1 MG PO TABS: 1.0000 mg | ORAL_TABLET | Freq: Three times a day (TID) | ORAL | Status: DC

## 2010-12-17 NOTE — Progress Notes (Signed)
Subjective:   Patient ID: [name] [name] male   DOB: [birth year] 60 y.o.   MRN: [id number]  HPI: [name] is a 60 y.o. man who presents to clinic today with his wife.  He is a poor historian and is not exactly sure why he is here.  The wife is also a poor historian but states that the reason she brought him in is because he was up wandering the house and the neighborhood as well as not sleeping at night for sometime.  She states that this has been going on for many months and seems to have been getting worse lately.  She states that for the last week he has been only sleeping a few hours a day.  He also walks around pulling things out around the house and states "I'm trying to find something."  She states that he needs help bathing, dressing, and reminders for toileting.  He is able to feed himself.  Wife also states that wherever he goes he takes the magazines and papers to bring them all home.  He has an armful of them with him at clinic today.   He states that he usually tries to go to bed after the 11 o'clock news but will lay in bed and read until about 2 am.  He states that he wakes up at 6 am.  He does not feel rested when he wakes up but is not tired during the day.  He states that he walks around because "has to see what is going on around."  He denies depressed mood, racing thoughts, tactile or auditory hallucinations.  He states that he sees little red birds around the neighborhood but denies seeing them in the room today.  His wife states that he has a bird feeder in the backyard.  Past Medical History  Diagnosis Date  . Stroke     3 strokes (2004, 2006, 2007)  . Diabetes mellitus   . Depression   . Hypertension    Current Outpatient Prescriptions  Medication Sig Dispense Refill  . amitriptyline (ELAVIL) 50 MG tablet TAKE 1 TABLET BY MOUTH EVERY NIGHT AT BEDTIME  31 tablet  0  . amLODipine (NORVASC) 10 MG tablet Take 1 tablet (10 mg total) by mouth daily.  90 tablet  2  .  atorvastatin (LIPITOR) 40 MG tablet Take 1 tablet (40 mg total) by mouth daily.  90 tablet  2  . Blood Glucose Monitoring Suppl (TRUETRACK BLOOD GLUCOSE) W/DEVICE KIT 1 kit by Does not apply route 3 (three) times daily before meals.  1 kit  0  . dipyridamole-aspirin (AGGRENOX) 25-200 MG per 12 hr capsule Take 1 capsule by mouth 2 (two) times daily.  60 capsule  3  . glucose blood (FREESTYLE TEST STRIPS) test strip 1 each by Other route as needed. Use as instructed       . glucose blood (TRUETRACK TEST) test strip Use as instructed  100 each  12  . losartan (COZAAR) 100 MG tablet Take 1 tablet (100 mg total) by mouth daily.  90 tablet  2  . metFORMIN (GLUCOPHAGE) 500 MG tablet Take 1 tablet (500 mg total) by mouth 2 (two) times daily with a meal.  60 tablet  2  . omeprazole (PRILOSEC) 20 MG capsule Take 1 capsule (20 mg total) by mouth daily.  60 capsule  2  . TRUEPLUS LANCETS 33G MISC USE AS DIRECTED  100 each  3   Family History  Problem Relation  Age of Onset  . Diabetes Maternal Grandmother    History   Social History  . Marital Status: Married    Spouse Name: N/A    Number of Children: N/A  . Years of Education: N/A   Social History Main Topics  . Smoking status: Former Smoker    Quit date: 04/29/2005  . Smokeless tobacco: None  . Alcohol Use: No  . Drug Use: No  . Sexually Active: None   Other Topics Concern  . None   Social History Narrative   Patient lives at home with his wife. His son helps keep an eye on him while the wife is at work.   Review of Systems: Constitutional: Denies fever, chills, diaphoresis, appetite change and fatigue.  HEENT: Denies photophobia, eye pain, redness, hearing loss, ear pain, congestion, sore throat, rhinorrhea, sneezing, mouth sores, trouble swallowing, neck pain, neck stiffness and tinnitus.   Respiratory: Denies SOB, DOE, cough, chest tightness,  and wheezing.   Cardiovascular: Denies chest pain, palpitations and leg swelling.    Gastrointestinal: Denies nausea, vomiting, abdominal pain, diarrhea, constipation, blood in stool and abdominal distention.  Genitourinary: Denies dysuria, urgency, frequency, hematuria, flank pain and difficulty urinating.  Musculoskeletal: Denies myalgias, back pain, joint swelling, arthralgias and gait problem.  Skin: Denies pallor, rash and wound.  Neurological: Denies dizziness, seizures, syncope, weakness, light-headedness, numbness and headaches.  Hematological: Denies adenopathy. Easy bruising, personal or family bleeding history  Psychiatric/Behavioral: Positive for confusion, sleep disturbance, and agitation.  Denies suicidal ideation, and nervousness  Objective:  Physical Exam: Filed Vitals:   12/17/10 1105  BP: 158/97  Pulse: 98  Temp: 97.1 F (36.2 C)  TempSrc: Oral  Height: 5\' 1"  (1.549 m)  Weight: 232 lb 11.2 oz (105.552 kg)   Constitutional: Vital signs reviewed.  Patient is a well-developed and well-nourished man in no acute distress and cooperative with exam. Alert and oriented to person, place, and month.  He requires prompting to maintain attention.  He is constantly fidgiting thoughout the interview. Head: Normocephalic and atraumatic Ear: TM normal bilaterally Mouth: no erythema or exudates, MMM Eyes: PERRL, EOMI, conjunctivae normal, No scleral icterus.  Neck: Supple, Trachea midline normal ROM, No JVD, mass, thyromegaly, or carotid bruit present.  Cardiovascular: RRR, S1 normal, S2 normal, no MRG, pulses symmetric and intact bilaterally Pulmonary/Chest: CTAB, no wheezes, rales, or rhonchi Abdominal: Soft. Non-tender, non-distended, bowel sounds are normal, no masses, organomegaly, or guarding present.  Neurological: A&O to person, place, and month with prompting, Strengh is normal and symmetric bilaterally, cranial nerve II-XII are grossly intact, no focal motor deficit, sensory intact to light touch bilaterally.  Skin: Warm, dry and intact. No rash, cyanosis,  or clubbing.  Psychiatric: mildly agitated and childlike mood and childlike affect. speech is simple with limited vocabulary and behavior mildly agitated and borderline inappropriate.  Judgment is poor and insight is poor. Some question of normal thought content, states he sees little red birds all over the neighborhood that no one else can see and he feeds them.  Cognition slow, attention is short and long term memory and short term memory are mildly decreased.   MMSE: Orientation date: 4/5 Orientation place: 3/5 Registation: 3 with 1 trial Attention: 1/5 Recall: 0/3 Identify: 2/2 Repeat: 1/1 3 stage command: 2/3 with 3 repetition Read and obey: 0/1 Sentence: 1/1 Pentagons: 0/1 Total: 17/30  Clock drawing:  Markedly abnormal.  See scanned document.  Assessment & Plan:

## 2010-12-17 NOTE — Patient Instructions (Signed)
Insomnia Insomnia is frequent trouble falling and/or staying asleep. Insomnia can be a long term problem or a short term problem. Both are common. Insomnia can be a short term problem when the wakefulness is related to a certain stress or worry. Long term insomnia is often related to ongoing stress during waking hours and/or poor sleeping habits. Overtime, sleep deprivation itself can make the problem worse. Every little thing feels more severe because you are overtired and your ability to cope is decreased. CAUSES   Stress, anxiety, and depression.   Poor sleeping habits.   Distractions such as TV in the bedroom.   Naps close to bedtime.   Engaging in emotionally charged conversations before bed.   Technical reading before sleep.   Alcohol and other sedatives. They may make the problem worse. They can hurt normal sleep patterns and normal dream activity.   Stimulants such as caffeine for several hours prior to bedtime.   Pain syndromes and shortness of breath can cause insomnia.   Exercise late at night.   Changing time zones may cause sleeping problems (jet lag).  It is sometimes helpful to have someone observe your sleeping patterns. They should look for periods of not breathing during the night (sleep apnea). They should also look to see how long those periods last. If you live alone or observers are uncertain, you can also be observed at a sleep clinic where your sleep patterns will be professionally monitored. Sleep apnea requires a checkup and treatment. Give your caregivers your medical history. Give your caregivers observations your family has made about your sleep.  SYMPTOMS   Not feeling rested in the morning.   Anxiety and restlessness at bedtime.   Difficulty falling and staying asleep.  TREATMENT   Your caregiver may prescribe treatment for an underlying medical disorders. Your caregiver can give advice or help if you are using alcohol or other drugs for  self-medication. Treatment of underlying problems will usually eliminate insomnia problems.   Medications can be prescribed for short time use. They are generally not recommended for lengthy use.   Over-the-counter sleep medicines are not recommended for lengthy use. They can be habit forming.   You can promote easier sleeping by making lifestyle changes such as:   Using relaxation techniques that help with breathing and reduce muscle tension.   Exercising earlier in the day.   Changing your diet and the time of your last meal. No night time snacks.   Establish a regular time to go to bed.   Counseling can help with stressful problems and worry.   Soothing music and white noise may be helpful if there are background noises you cannot remove.   Stop tedious detailed work at least one hour before bedtime.  HOME CARE INSTRUCTIONS   Keep a diary. Inform your caregiver about your progress. This includes any medication side effects. See your caregiver regularly. Take note of:   Times when you are asleep.   Times when you are awake during the night.   The quality of your sleep.   How you feel the next day.  This information will help your caregiver care for you.  Get out of bed if you are still awake after 15 minutes. Read or do some quiet activity. Keep the lights down. Wait until you feel sleepy and go back to bed.   Keep regular sleeping and waking hours. Avoid naps.   Exercise regularly.   Avoid distractions at bedtime. Distractions include watching television or engaging   in any intense or detailed activity like attempting to balance the household checkbook.   Develop a bedtime ritual. Keep a familiar routine of bathing, brushing your teeth, climbing into bed at the same time each night, listening to soothing music. Routines increase the success of falling to sleep faster.   Use relaxation techniques. This can be using breathing and muscle tension release routines. It can  also include visualizing peaceful scenes. You can also help control troubling or intruding thoughts by keeping your mind occupied with boring or repetitive thoughts like the old concept of counting sheep. You can make it more creative like imagining planting one beautiful flower after another in your backyard garden.   During your day, work to eliminate stress. When this is not possible use some of the previous suggestions to help reduce the anxiety that accompanies stressful situations.  MAKE SURE YOU:   Understand these instructions.   Will watch your condition.   Will get help right away if you are not doing well or get worse.  Document Released: 12/29/1999 Document Revised: 09/12/2010 Document Reviewed: 01/28/2007 [employer] Patient Information 2012 [employer]  1. Start Risperidone 1 mg tablet.  Take 1 tablet three times daily.  2. Continue all your other medications.  3.  Follow up in 2 weeks.

## 2010-12-17 NOTE — Telephone Encounter (Signed)
Call from pt's wife said that pt is agitated and has not slept well all night.  Would like to get something for pt's nerves if possible.  Pt was given an appointment to come in for assessment this am.  Angelina Ok, RN 12/17/2010. 8:58 AM.

## 2010-12-19 NOTE — Assessment & Plan Note (Addendum)
His insomnia is likely related to his day night dysfunction from his dementia.  We will start with the Risperdal and see if that helps him.  We also discussed sleep hygiene and that he needs to not read in bed when he is trying to sleep.  He will continue on the hydroxyzine and Amitriptyline.

## 2010-12-19 NOTE — Assessment & Plan Note (Signed)
Lab Results  Component Value Date   HGBA1C 6.0 09/25/2010   HGBA1C 6.3 04/30/2010   HGBA1C 6.2 12/04/2009   Lab Results  Component Value Date   MICROALBUR 1.22 09/25/2010   LDLCALC 66 03/03/2009   CREATININE 1.17 09/14/2010   His A1C is well controlled.  He will continue on his current medication.

## 2010-12-19 NOTE — Assessment & Plan Note (Signed)
Lab Results  Component Value Date   NA 141 09/14/2010   K 4.2 09/14/2010   CL 108 09/14/2010   CO2 25 09/14/2010   BUN 17 09/14/2010   CREATININE 1.17 09/14/2010   CREATININE 1.41 06/14/2010    BP Readings from Last 3 Encounters:  12/17/10 158/97  09/25/10 132/85  04/30/10 122/81    Assessment: Hypertension control:  mildly elevated  Progress toward goals:  deteriorated Barriers to meeting goals:  nonadherence to medications and lack of understanding of disease management  Plan: Hypertension treatment:  continue current medications We wil continue his medications since this is the first elevation in a while.  If he continues to be high we will adjust his medication.

## 2010-12-19 NOTE — Assessment & Plan Note (Signed)
The patient has dementia with behavioral changes likely secondary to his multiple strokes.  His MMSE is telling with losses in attention and recall.  He also has some question of visual hallucinations.  At this time his wife does not want any referrals to neurology or help with placement in an ALF or SNF.  We discussed [facility] and she will consider it to help her recover from her recent medical problems as well as when she goes back to work.  For his agitation and wandering we will start with a lower dose of Risperdal 1 mg TID to try to help him relax.  We might also need something to help him sleep at night as well.

## 2010-12-20 ENCOUNTER — Telehealth: Payer: Self-pay | Admitting: Licensed Clinical Social Worker

## 2010-12-20 NOTE — Telephone Encounter (Signed)
Called Mrs. Lineback and urged her to call [facility] for assessment and assistance w/ her husband.   Also discussed [facility] and will send her brochure and coupon for free trial visit.   Her phone was going in and out and it was very difficult to communicate w/ her.  Told her to come in to visit w/ me and I would be glad to work w/ her.

## 2010-12-28 ENCOUNTER — Other Ambulatory Visit: Payer: Self-pay | Admitting: Internal Medicine

## 2010-12-31 ENCOUNTER — Ambulatory Visit: Payer: Medicare Other | Admitting: Internal Medicine

## 2011-01-04 ENCOUNTER — Other Ambulatory Visit: Payer: Self-pay | Admitting: Internal Medicine

## 2011-01-17 ENCOUNTER — Other Ambulatory Visit: Payer: Self-pay | Admitting: Internal Medicine

## 2011-01-17 NOTE — Telephone Encounter (Signed)
Refill request received for risperidone 1 mg tid, started on 12/3 by Dr. Tonny Branch for management of agitation and hallucinations in setting of dementia.  I called and spoke with patient's wife, who said he has done better on the risperidone but has been a little more drowsy.  He missed a follow up appointment in December because they could not get transportation.  He needs follow up for consideration of a lower dose of risperidone or an alternative regimen, and she agreed to bring him in to clinic on Monday.  Plan is to decrease risperidone to a dose of 1 mg bid pending his follow-up appointment next week.  Would consider decreasing to 0.5 mg bid then or choosing an alternative regimen, and need to discuss risk/benefit of continued atypical antipsychotics in setting of dementia.

## 2011-01-21 ENCOUNTER — Ambulatory Visit (INDEPENDENT_AMBULATORY_CARE_PROVIDER_SITE_OTHER): Payer: Medicare Other | Admitting: Internal Medicine

## 2011-01-21 ENCOUNTER — Encounter: Payer: Self-pay | Admitting: Internal Medicine

## 2011-01-21 VITALS — BP 143/81 | HR 92 | Temp 97.0°F | Ht 61.0 in | Wt 245.7 lb

## 2011-01-21 DIAGNOSIS — E119 Type 2 diabetes mellitus without complications: Secondary | ICD-10-CM

## 2011-01-21 DIAGNOSIS — I10 Essential (primary) hypertension: Secondary | ICD-10-CM

## 2011-01-21 DIAGNOSIS — G4700 Insomnia, unspecified: Secondary | ICD-10-CM

## 2011-01-21 DIAGNOSIS — E785 Hyperlipidemia, unspecified: Secondary | ICD-10-CM

## 2011-01-21 DIAGNOSIS — F0280 Dementia in other diseases classified elsewhere without behavioral disturbance: Secondary | ICD-10-CM

## 2011-01-21 LAB — LIPID PANEL
Cholesterol: 132 mg/dL (ref 0–200)
Triglycerides: 117 mg/dL (ref <150)

## 2011-01-21 LAB — POCT GLYCOSYLATED HEMOGLOBIN (HGB A1C): Hemoglobin A1C: 6.2
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
## 2011-01-21 NOTE — Progress Notes (Signed)
I agree with Dr. Wainwright's assessment and plan. 

## 2011-01-21 NOTE — Assessment & Plan Note (Signed)
Pressure ok today.  No change for now

## 2011-01-21 NOTE — Assessment & Plan Note (Signed)
Much improved since starting risperidone and hydroxyzine.  Will continue these as is for now.  Recheck in 6 wks.

## 2011-01-21 NOTE — Patient Instructions (Signed)
Please return to the clinic for follow-up in 6 weeks

## 2011-01-21 NOTE — Assessment & Plan Note (Signed)
HbA1c 6.2% today. Continue metformin.  Has eye exam scheduled for 02/20/11 per wife.

## 2011-01-21 NOTE — Assessment & Plan Note (Signed)
Checking lipid panel today.  Is taking his lipitor

## 2011-01-21 NOTE — Progress Notes (Signed)
Subjective:   Patient ID: [name] [name] male   DOB: Apr 13, 1950 61 y.o.   MRN: [id number]  HPI: [name] [name] is a 61 y.o. man with frontal lobe type dementia after having CVAs, DM, depression, and HTN here for follow-up after starting a regimen of risperidone at last visit.  Per patient and his wife, he is doing better.  Sleeping through the night, less wandering around.  Less grabbing at things randomly.    Reports taking all of his medications as directed.  No hallucinations, delusions, paranoia, or hearing voices.    Past Medical History  Diagnosis Date  . Stroke     3 strokes (2004, 2006, 2007)  . Diabetes mellitus   . Depression   . Hypertension    Current Outpatient Prescriptions  Medication Sig Dispense Refill  . amitriptyline (ELAVIL) 50 MG tablet TAKE 1 TABLET BY MOUTH EVERY NIGHT AT BEDTIME  31 tablet  0  . amLODipine (NORVASC) 10 MG tablet Take 1 tablet (10 mg total) by mouth daily.  90 tablet  2  . atorvastatin (LIPITOR) 40 MG tablet Take 1 tablet (40 mg total) by mouth daily.  90 tablet  2  . Blood Glucose Monitoring Suppl (TRUETRACK BLOOD GLUCOSE) W/DEVICE KIT 1 kit by Does not apply route 3 (three) times daily before meals.  1 kit  0  . dipyridamole-aspirin (AGGRENOX) 25-200 MG per 12 hr capsule Take 1 capsule by mouth 2 (two) times daily.  60 capsule  3  . glucose blood (FREESTYLE TEST STRIPS) test strip 1 each by Other route as needed. Use as instructed       . glucose blood (TRUETRACK TEST) test strip Use as instructed  100 each  12  . hydrOXYzine (ATARAX/VISTARIL) 50 MG tablet Take 50 mg by mouth at bedtime as needed.        . hydrOXYzine (ATARAX/VISTARIL) 50 MG tablet TAKE 1 TABLET BY MOUTH EVERY NIGHT AT BEDTIME AS NEEDED FOR ITCHING AND INABILITY TO SLEEP  30 tablet  1  . losartan (COZAAR) 100 MG tablet Take 1 tablet (100 mg total) by mouth daily.  90 tablet  2  . metFORMIN (GLUCOPHAGE) 500 MG tablet TAKE 1 TABLET BY MOUTH TWICE DAILY WITH A MEAL  60 tablet  1   . omeprazole (PRILOSEC) 20 MG capsule Take 1 capsule (20 mg total) by mouth daily.  60 capsule  2  . risperiDONE (RISPERDAL) 1 MG tablet Take 1 tablet (1 mg total) by mouth 2 (two) times daily.  30 tablet  0  . TRUEPLUS LANCETS 33G MISC USE AS DIRECTED  100 each  3   Family History  Problem Relation Age of Onset  . Diabetes Maternal Grandmother    History   Social History  . Marital Status: Married    Spouse Name: N/A    Number of Children: N/A  . Years of Education: N/A   Social History Main Topics  . Smoking status: Former Smoker    Quit date: 04/29/2005  . Smokeless tobacco: None  . Alcohol Use: No  . Drug Use: No  . Sexually Active: None   Other Topics Concern  . None   Social History Narrative   Patient lives at home with his wife. His son helps keep an eye on him while the wife is at work.   Review of Systems: Constitutional: Denies fever, chills, diaphoresis Respiratory: Denies SOB, DOE, cough, chest tightness,  and wheezing.   Cardiovascular: Denies chest pain, palpitations Gastrointestinal: Denies  nausea, vomiting,  diarrhea, constipation  Objective:  Physical Exam: Filed Vitals:   01/21/11 1024  BP: 143/81  Pulse: 92  Temp: 97 F (36.1 C)  TempSrc: Oral  Height: 5\' 1"  (1.549 m)  Weight: 245 lb 11.2 oz (111.449 kg)  SpO2: 97%   Constitutional: Vital signs reviewed.  Patient is a well-developed and well-nourished male in no acute distress and cooperative with exam. Alert and oriented x3.  Head: Normocephalic and atraumatic Mouth: no erythema or exudates, MMM Eyes: PERRL, EOMI, conjunctivae normal, No scleral icterus.  Neck: No JVD. Cardiovascular: RRR, S1 normal, S2 normal, no MRG, pulses symmetric and intact bilaterally Pulmonary/Chest: CTAB, no wheezes, rales, or rhonchi  Neurological: A&O x3, Strength is normal and symmetric bilaterally, cranial nerve II-XII are grossly intact.  Psychiatric: Answers questions, fully oriented.  Somewhat blunted  affect.    Assessment & Plan:

## 2011-01-31 ENCOUNTER — Other Ambulatory Visit: Payer: Self-pay | Admitting: Internal Medicine

## 2011-02-12 ENCOUNTER — Telehealth: Payer: Self-pay | Admitting: *Deleted
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
## 2011-02-12 NOTE — Telephone Encounter (Signed)
see documentation

## 2011-02-12 NOTE — Telephone Encounter (Signed)
This is Round 2 (or 3, or 4, or 5..Marland KitchenMarland KitchenMarland Kitchen) I have had this gentleman in the hospital at least once for exactly the same problem.  She is probably ambivalent and keeps taking him back.  Call the ACT team or whatever hospital social worker will answer the phone.  Or take him home yourself.Marland KitchenMarland KitchenMarland Kitchen

## 2011-02-12 NOTE — Telephone Encounter (Signed)
I called the ACT team and they recommend pt being brought to [facility] or [facility] ED for pshcy. Evaluation.  They will assess and recommend placement.  Pt's wife called and informed.  She voices understanding.

## 2011-02-14 ENCOUNTER — Other Ambulatory Visit: Payer: Self-pay | Admitting: Internal Medicine

## 2011-02-19 NOTE — Progress Notes (Signed)
Addended by: Remus Blake on: 02/19/2011 10:02 AM   Modules accepted: Orders

## 2011-03-08 ENCOUNTER — Other Ambulatory Visit: Payer: Self-pay | Admitting: Internal Medicine

## 2011-03-18 ENCOUNTER — Other Ambulatory Visit: Payer: Self-pay | Admitting: Internal Medicine

## 2011-04-02 ENCOUNTER — Other Ambulatory Visit: Payer: Self-pay | Admitting: Internal Medicine

## 2011-04-03 NOTE — Telephone Encounter (Signed)
Do we know if he has been placed somewhere?

## 2011-04-05 MED ORDER — HYDROXYZINE HCL 50 MG PO TABS: 50.0000 mg | ORAL_TABLET | Freq: Four times a day (QID) | ORAL | Status: DC | PRN

## 2011-04-05 NOTE — Telephone Encounter (Signed)
i called and spoke to pt's spouse, pt is doing much better he is at home but he needs to continue the "medicine" that the dr gave him"

## 2011-04-15 ENCOUNTER — Other Ambulatory Visit: Payer: Self-pay | Admitting: *Deleted

## 2011-04-15 DIAGNOSIS — I10 Essential (primary) hypertension: Secondary | ICD-10-CM

## 2011-04-15 DIAGNOSIS — K219 Gastro-esophageal reflux disease without esophagitis: Secondary | ICD-10-CM

## 2011-04-15 NOTE — Telephone Encounter (Signed)
Needs refill on Folic acid - called into [facility]. They changed pharmacy for this med. Stanton Kidney Jaimeson Gopal RN 04/15/11 5:10PM

## 2011-04-16 ENCOUNTER — Other Ambulatory Visit: Payer: Self-pay | Admitting: Internal Medicine

## 2011-04-16 ENCOUNTER — Telehealth: Payer: Self-pay | Admitting: *Deleted

## 2011-04-16 NOTE — Telephone Encounter (Signed)
Pt's mother has been calling about cough med before we close at 5:00PM today.  Thanks

## 2011-04-16 NOTE — Telephone Encounter (Signed)
Wife called to see if a refill on generic Robitussin AC can be refill - pt has green productive cough. Uses [facility] / [address]. Stanton Kidney Annalee Meyerhoff RN 04/16/11 5PM

## 2011-04-16 NOTE — Telephone Encounter (Signed)
Cough started last PM. Started Mucinex - helped congestion but not cough. Last needed cough med 4 months ago. Will cough med and ask that an [facility] appt be made to assess pt. Wife states breathing OK - just a little but harder.

## 2011-04-16 NOTE — Telephone Encounter (Signed)
Cheratussin called to [facility]; message sent to front desk for an appt.

## 2011-04-17 ENCOUNTER — Other Ambulatory Visit: Payer: Self-pay | Admitting: Internal Medicine

## 2011-04-17 MED ORDER — LOSARTAN POTASSIUM 100 MG PO TABS: 100.0000 mg | ORAL_TABLET | Freq: Every day | ORAL | Status: DC

## 2011-04-17 MED ORDER — OMEPRAZOLE 20 MG PO CPDR: 20.0000 mg | DELAYED_RELEASE_CAPSULE | Freq: Every day | ORAL | Status: DC

## 2011-04-17 MED ORDER — AMITRIPTYLINE HCL 50 MG PO TABS: 50.0000 mg | ORAL_TABLET | Freq: Every day | ORAL | Status: DC

## 2011-04-17 MED ORDER — GUAIFENESIN-CODEINE 100-10 MG/5ML PO SYRP: 5.0000 mL | ORAL_SOLUTION | Freq: Three times a day (TID) | ORAL | Status: DC | PRN

## 2011-04-17 NOTE — Telephone Encounter (Signed)
Will fill omeprazole, amitriptyline, losartan once without refills; I tried calling patient's wife to discuss how he has been doing, because it doesn't look like he has been in since some acute deterioration in January 2012.  Please schedule patient to be seen in clinic within the next month. Thanks!

## 2011-04-18 NOTE — Telephone Encounter (Signed)
Appt made by C. Boone. 

## 2011-04-25 ENCOUNTER — Ambulatory Visit (INDEPENDENT_AMBULATORY_CARE_PROVIDER_SITE_OTHER): Payer: Medicare Other | Admitting: Internal Medicine

## 2011-04-25 ENCOUNTER — Encounter: Payer: Self-pay | Admitting: Internal Medicine

## 2011-04-25 ENCOUNTER — Telehealth: Payer: Self-pay | Admitting: *Deleted

## 2011-04-25 VITALS — BP 135/79 | HR 97 | Temp 97.1°F | Ht 61.0 in | Wt 242.1 lb

## 2011-04-25 DIAGNOSIS — I639 Cerebral infarction, unspecified: Secondary | ICD-10-CM

## 2011-04-25 DIAGNOSIS — E785 Hyperlipidemia, unspecified: Secondary | ICD-10-CM

## 2011-04-25 DIAGNOSIS — I6350 Cerebral infarction due to unspecified occlusion or stenosis of unspecified cerebral artery: Secondary | ICD-10-CM

## 2011-04-25 DIAGNOSIS — J209 Acute bronchitis, unspecified: Secondary | ICD-10-CM

## 2011-04-25 MED ORDER — ATORVASTATIN CALCIUM 40 MG PO TABS: 40.0000 mg | ORAL_TABLET | Freq: Every day | ORAL | Status: DC

## 2011-04-25 MED ORDER — DOXYCYCLINE HYCLATE 100 MG PO TABS: 100.0000 mg | ORAL_TABLET | Freq: Two times a day (BID) | ORAL | Status: AC

## 2011-04-25 MED ORDER — GLUCOSE BLOOD VI STRP: ORAL_STRIP | Status: AC

## 2011-04-25 MED ORDER — METFORMIN HCL 500 MG PO TABS: 500.0000 mg | ORAL_TABLET | Freq: Two times a day (BID) | ORAL | Status: DC

## 2011-04-25 MED ORDER — ALBUTEROL SULFATE HFA 108 (90 BASE) MCG/ACT IN AERS: 2.0000 | INHALATION_SPRAY | Freq: Four times a day (QID) | RESPIRATORY_TRACT | Status: DC | PRN

## 2011-04-25 MED ORDER — ASPIRIN-DIPYRIDAMOLE ER 25-200 MG PO CP12: 1.0000 | ORAL_CAPSULE | Freq: Two times a day (BID) | ORAL | Status: DC

## 2011-04-25 MED ORDER — GLUCOSE BLOOD VI STRP: ORAL_STRIP | Status: DC

## 2011-04-25 NOTE — Patient Instructions (Signed)
Pleae, pick aup a prescription for antibioitc and albuterol inhaler for your infection. Drink plenty of fluids and call with any questions. Please follow upw with Dr. Milbert Coulter in 8 weeks

## 2011-04-25 NOTE — Telephone Encounter (Signed)
Wife called still has yellow-green productive cough. Cough syrup not helping. Appt toay 04/25/11 1:45PM with Dr Denton Meek. Aware of appt time. Stanton Kidney Chrystian Ressler RN 04/25/11 9AM

## 2011-04-25 NOTE — Progress Notes (Signed)
Patient ID: [name] [name], male   DOB: 19-Apr-1950, 61 y.o.   MRN: [id number] HPI:    1. Productive cough with thick, green sputum in 1 tsp amounts, with chills, sweats and malaise for 8 days. Patient  Was Rx-ed Cheratussin through our clinic, however, states that no improvement of his Sx. Denies any rash, hematuria, LBP, or swelling. Denies any known sick contacts or travel. Review of Systems: Negative except per history of present illness  Physical Exam:  Nursing notes and vitals reviewed General:  alert, well-developed, and cooperative to examination.   Lungs:  normal respiratory effort, no accessory muscle use, normal breath sounds, no crackles, and no wheezes. Heart:  normal rate, regular rhythm, no murmurs, no gallop, and no rub.   Abdomen:  soft, non-tender, normal bowel sounds, no distention, no guarding, no rebound tenderness, no hepatomegaly, and no splenomegaly.   Extremities:  No cyanosis, clubbing, edema Neurologic:  alert & oriented X3, nonfocal exam  Meds: Medications Prior to Admission  Medication Sig Dispense Refill  . AGGRENOX 25-200 MG per 12 hr capsule TAKE 1 CAPSULE BY MOUTH TWICE DAILY  60 capsule  2  . amitriptyline (ELAVIL) 50 MG tablet Take 1 tablet (50 mg total) by mouth at bedtime.  30 tablet  0  . amLODipine (NORVASC) 10 MG tablet Take 1 tablet (10 mg total) by mouth daily.  90 tablet  2  . atorvastatin (LIPITOR) 40 MG tablet Take 1 tablet (40 mg total) by mouth daily.  90 tablet  2  . Blood Glucose Monitoring Suppl (TRUETRACK BLOOD GLUCOSE) W/DEVICE KIT 1 kit by Does not apply route 3 (three) times daily before meals.  1 kit  0  . glucose blood (FREESTYLE TEST STRIPS) test strip 1 each by Other route as needed. Use as instructed       . glucose blood (TRUETRACK TEST) test strip Use as instructed  100 each  12  . guaiFENesin-codeine (CHERATUSSIN AC) 100-10 MG/5ML syrup Take 5 mLs by mouth 3 (three) times daily as needed for cough.  120 mL  0  . hydrOXYzine  (ATARAX/VISTARIL) 50 MG tablet Take 50 mg by mouth at bedtime as needed.        . hydrOXYzine (ATARAX/VISTARIL) 50 MG tablet Take 1 tablet (50 mg total) by mouth every 6 (six) hours as needed for itching.  30 tablet  5  . losartan (COZAAR) 100 MG tablet Take 1 tablet (100 mg total) by mouth daily.  90 tablet  2  . metFORMIN (GLUCOPHAGE) 500 MG tablet TAKE 1 TABLET BY MOUTH TWICE DAILY WITH A MEAL  60 tablet  1  . omeprazole (PRILOSEC) 20 MG capsule Take 1 capsule (20 mg total) by mouth daily.  60 capsule  2  . omeprazole (PRILOSEC) 20 MG capsule Take 1 capsule (20 mg total) by mouth daily.  30 capsule  11  . risperiDONE (RISPERDAL) 1 MG tablet TAKE 1 TABLET BY MOUTH TWICE DAILY  60 tablet  5  . TRUEPLUS LANCETS 33G MISC USE AS DIRECTED  100 each  3   No current facility-administered medications on file as of 04/25/2011.    Allergies: Divalproex sodium Past Medical History  Diagnosis Date  . Stroke     3 strokes (2004, 2006, 2007)  . Diabetes mellitus   . Depression   . Hypertension    No past surgical history on file. Family History  Problem Relation Age of Onset  . Diabetes Maternal Grandmother    History  Social History  . Marital Status: Married    Spouse Name: N/A    Number of Children: N/A  . Years of Education: N/A   Occupational History  . Not on file.   Social History Main Topics  . Smoking status: Former Smoker    Quit date: 04/29/2005  . Smokeless tobacco: Not on file  . Alcohol Use: No  . Drug Use: No  . Sexually Active: Not on file   Other Topics Concern  . Not on file   Social History Narrative   Patient lives at home with his wife. His son helps keep an eye on him while the wife is at work.    A/P: 1.  Acute bronchitis with bronchosmasm -doxycycline -albuterol -Cheratussin -Hydration -Respiratory precautions

## 2011-05-17 ENCOUNTER — Encounter: Payer: Medicare Other | Admitting: Internal Medicine

## 2011-05-22 ENCOUNTER — Encounter: Payer: Self-pay | Admitting: Internal Medicine

## 2011-05-29 ENCOUNTER — Telehealth: Payer: Self-pay | Admitting: *Deleted

## 2011-05-29 NOTE — Telephone Encounter (Signed)
Pt's wife came by asking for refill on folic Acid. I told her pt was no longer on this med. Therapy complete.

## 2011-06-12 ENCOUNTER — Other Ambulatory Visit: Payer: Self-pay | Admitting: Internal Medicine

## 2011-06-13 ENCOUNTER — Other Ambulatory Visit: Payer: Self-pay | Admitting: Internal Medicine

## 2011-06-13 NOTE — Telephone Encounter (Signed)
Are we sure that aggrenox has not been discontinued?  Why is it not on his med list?

## 2011-06-14 NOTE — Telephone Encounter (Signed)
This request was sent by [facility] so only shows up in the Rx refill box, that's why it keeps getting back to you. I called the pharmacy as med list shows this med was refilled on 04/25/11 with 11 refills. It is on the med list. This was sent in error so please disregard.

## 2011-07-14 ENCOUNTER — Other Ambulatory Visit: Payer: Self-pay | Admitting: Internal Medicine

## 2011-08-06 ENCOUNTER — Other Ambulatory Visit: Payer: Self-pay | Admitting: Internal Medicine

## 2011-08-09 ENCOUNTER — Other Ambulatory Visit: Payer: Self-pay | Admitting: Internal Medicine

## 2011-08-27 ENCOUNTER — Ambulatory Visit (INDEPENDENT_AMBULATORY_CARE_PROVIDER_SITE_OTHER): Payer: Medicare Other | Admitting: Internal Medicine

## 2011-08-27 ENCOUNTER — Encounter: Payer: Self-pay | Admitting: Licensed Clinical Social Worker

## 2011-08-27 ENCOUNTER — Encounter: Payer: Medicare Other | Admitting: Internal Medicine

## 2011-08-27 ENCOUNTER — Encounter: Payer: Self-pay | Admitting: Internal Medicine

## 2011-08-27 VITALS — BP 107/77 | HR 101 | Temp 97.5°F | Ht 67.0 in | Wt 252.5 lb

## 2011-08-27 DIAGNOSIS — E119 Type 2 diabetes mellitus without complications: Secondary | ICD-10-CM

## 2011-08-27 DIAGNOSIS — D649 Anemia, unspecified: Secondary | ICD-10-CM

## 2011-08-27 DIAGNOSIS — Z1211 Encounter for screening for malignant neoplasm of colon: Secondary | ICD-10-CM

## 2011-08-27 DIAGNOSIS — G3109 Other frontotemporal dementia: Secondary | ICD-10-CM

## 2011-08-27 DIAGNOSIS — F0280 Dementia in other diseases classified elsewhere without behavioral disturbance: Secondary | ICD-10-CM

## 2011-08-27 DIAGNOSIS — I10 Essential (primary) hypertension: Secondary | ICD-10-CM

## 2011-08-27 LAB — COMPREHENSIVE METABOLIC PANEL
AST: 18 U/L (ref 0–37)
Albumin: 4.3 g/dL (ref 3.5–5.2)
Alkaline Phosphatase: 72 U/L (ref 39–117)
BUN: 16 mg/dL (ref 6–23)
Potassium: 4.2 mEq/L (ref 3.5–5.3)
Sodium: 144 mEq/L (ref 135–145)
Total Bilirubin: 0.3 mg/dL (ref 0.3–1.2)
Total Protein: 7.5 g/dL (ref 6.0–8.3)

## 2011-08-27 LAB — CBC
Hemoglobin: 11.8 g/dL — ABNORMAL LOW (ref 13.0–17.0)
MCH: 24.8 pg — ABNORMAL LOW (ref 26.0–34.0)
MCHC: 33.2 g/dL (ref 30.0–36.0)
MCV: 74.6 fL — ABNORMAL LOW (ref 78.0–100.0)

## 2011-08-27 LAB — POCT GLYCOSYLATED HEMOGLOBIN (HGB A1C): Hemoglobin A1C: 6.3

## 2011-08-27 NOTE — Patient Instructions (Signed)
-  You seem to be doing great! You and Mrs Pickeral keep up the great work - and if you think of it, try and help Mrs Dowell out some  -Be sure to have your colonoscopy done.  Please be sure to bring all of your medications with you to every visit.  Should you have any new or worsening symptoms, please be sure to call the clinic at [phone].

## 2011-08-27 NOTE — Assessment & Plan Note (Signed)
Lab Results  Component Value Date   HGBA1C 6.3 08/27/2011   HGBA1C 6.2 12/04/2009   CREATININE 1.17 09/14/2010   CREATININE 1.41 06/14/2010   MICROALBUR 1.22 09/25/2010   MICRALBCREAT 5.7 09/25/2010   CHOL 132 01/21/2011   HDL 46 01/21/2011   TRIG 117 01/21/2011    Last eye exam and foot exam:    Component Value Date/Time   HMDIABFOOTEX Done 08/02/2009    Assessment: Diabetes control: controlled Progress toward goals: at goal Barriers to meeting goals: no barriers identified  Plan: Diabetes treatment: continue current medications  which is metformin 500 twice a day Refer to: none Instruction/counseling given: reminded to bring blood glucose meter & log to each visit and reminded to bring medications to each visit  Patient's wife to schedule eye exam and the coming week

## 2011-08-27 NOTE — Assessment & Plan Note (Signed)
Lab Results  Component Value Date   NA 141 09/14/2010   K 4.2 09/14/2010   CL 108 09/14/2010   CO2 25 09/14/2010   BUN 17 09/14/2010   CREATININE 1.17 09/14/2010   CREATININE 1.41 06/14/2010    BP Readings from Last 3 Encounters:  08/27/11 107/77  04/25/11 135/79  01/21/11 143/81    Assessment: Hypertension control:  controlled  Progress toward goals:  at goal Barriers to meeting goals:  no barriers identified  Plan: Hypertension treatment:  continue current medications Which include losartan and amlodipine -cmet today to monitor renal function and electrolytes
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
## 2011-08-27 NOTE — Assessment & Plan Note (Signed)
-  Repeat CBC -Patient has been on folate supplementation in the past, which has since been repleted; most recent ferritin, folate, and B12 have been within normal limits -Schedule colonoscopy

## 2011-08-27 NOTE — Progress Notes (Signed)
Subjective:   Patient ID: [name] [name] male   DOB: [birth year] 61 y.o.   MRN: [id number]  HPI: [name] [name] is a 61 y.o. man with history of stroke, diabetes, depression, hypertension, and dementia who presents today for routine followup. He has no complaints at this time. He does report that yesterday his left knee hurt for a little while, and felt like it is giving way, but quickly subsided. His wife accompanies him today, and reports that though he seems to have improved in terms of his aggressive behavior, he continues to be excessively dependent on her. She has to tell him to try to use the bathroom every hour, because otherwise he will not use the bathroom without her help. Initially, I thought this was due to his dementia, but she reports that he goes to [employer] for 8-9 hours at a time to handout with other guys, and is able to go to the bathroom on his own at that time. She believes that he is increasingly wheezy. She has also recently recovered from a leg injury, and thus has been feeling increasingly stressed.  Patient is sleeping better, and wandering less.  Past Medical History  Diagnosis Date  . Stroke     3 strokes (2004, 2006, 2007)  . Diabetes mellitus   . Depression   . Hypertension   . ANEMIA, NORMOCYTIC, CHRONIC 11/17/2007    Qualifier: Diagnosis of  By: Elvera Lennox MD, Silvestre Mesi    . Dementia of frontal lobe type 12/17/2010  . HYPERLIPIDEMIA 04/24/2006    Qualifier: Diagnosis of  By: Elvera Lennox MD, Silvestre Mesi    . RENAL INSUFFICIENCY, CHRONIC 04/25/2006    Qualifier: Diagnosis of  By: Elvera Lennox MD, Silvestre Mesi     Current Outpatient Prescriptions  Medication Sig Dispense Refill  . amitriptyline (ELAVIL) 50 MG tablet TAKE 1 TABLET BY MOUTH EVERY NIGHT AT BEDTIME  30 tablet  0  . amLODipine (NORVASC) 10 MG tablet TAKE 1 TABLET BY MOUTH DAILY  90 tablet  1  . atorvastatin (LIPITOR) 40 MG tablet Take 1 tablet (40 mg total) by mouth daily.  90 tablet  2  . Blood Glucose Monitoring  Suppl (TRUETRACK BLOOD GLUCOSE) W/DEVICE KIT 1 kit by Does not apply route 3 (three) times daily before meals.  1 kit  0  . dipyridamole-aspirin (AGGRENOX) 25-200 MG per 12 hr capsule Take 1 capsule by mouth 2 (two) times daily.  60 capsule  11  . glucose blood (FREESTYLE TEST STRIPS) test strip Check blood sugar once a day  100 each  11  . glucose blood (TRUETRACK TEST) test strip Use as instructed  100 each  12  . hydrOXYzine (ATARAX/VISTARIL) 50 MG tablet Take 1 tablet (50 mg total) by mouth every 6 (six) hours as needed for itching.  30 tablet  5  . losartan (COZAAR) 100 MG tablet Take 1 tablet (100 mg total) by mouth daily.  90 tablet  2  . metFORMIN (GLUCOPHAGE) 500 MG tablet Take 1 tablet (500 mg total) by mouth 2 (two) times daily with a meal.  60 tablet  11  . omeprazole (PRILOSEC) 20 MG capsule Take 1 capsule (20 mg total) by mouth daily.  30 capsule  11  . risperiDONE (RISPERDAL) 1 MG tablet TAKE 1 TABLET BY MOUTH TWICE DAILY  60 tablet  5  . TRUEPLUS LANCETS 33G MISC USE AS DIRECTED  100 each  3  . albuterol (PROVENTIL HFA;VENTOLIN HFA) 108 (90 BASE) MCG/ACT inhaler Inhale 2 puffs into the  lungs every 6 (six) hours as needed for wheezing.  1 Inhaler  0  . DISCONTD: losartan (COZAAR) 100 MG tablet TAKE 1 TABLET BY MOUTH EVERY DAY  90 tablet  1  . DISCONTD: omeprazole (PRILOSEC) 20 MG capsule Take 1 capsule (20 mg total) by mouth daily.  60 capsule  2   Family History  Problem Relation Age of Onset  . Diabetes Maternal Grandmother    History   Social History  . Marital Status: Married    Spouse Name: N/A    Number of Children: N/A  . Years of Education: N/A   Social History Main Topics  . Smoking status: Former Smoker    Quit date: 04/29/2005  . Smokeless tobacco: None  . Alcohol Use: No  . Drug Use: No  . Sexually Active: None   Other Topics Concern  . None   Social History Narrative   Patient lives at home with his wife. His son helps keep an eye on him while the  wife is at work.   Review of Systems: Constitutional: Denies fever, chills, diaphoresis, appetite change and fatigue.  HEENT: Denies photophobia, eye pain, redness, hearing loss, ear pain, congestion, sore throat, rhinorrhea, sneezing, mouth sores, trouble swallowing, neck pain, neck stiffness and tinnitus.   Respiratory: Denies SOB, DOE, cough, chest tightness,  and wheezing.   Cardiovascular: Denies chest pain, palpitations and leg swelling.  Gastrointestinal: Denies nausea, vomiting, abdominal pain, diarrhea, constipation, blood in stool and abdominal distention.  Genitourinary: Denies dysuria, urgency, frequency, hematuria, flank pain and difficulty urinating.  Musculoskeletal: Denies myalgias, back pain, joint swelling, and gait problem.  Skin: Denies pallor, rash and wound.  Neurological: Denies dizziness, seizures, syncope, weakness, light-headedness, numbness and headaches.  Psychiatric/Behavioral: Denies suicidal ideation, mood changes, confusion, nervousness, sleep disturbance and agitation  Objective:  Physical Exam: Filed Vitals:   08/27/11 0938  BP: 107/77  Pulse: 101  Temp: 97.5 F (36.4 C)  TempSrc: Oral  Height: 5\' 7"  (1.702 m)  Weight: 252 lb 8 oz (114.533 kg)   Constitutional: Vital signs reviewed.  Patient is a well-developed and well-nourished man in no acute distress and cooperative with exam.  Mouth: no erythema or exudates, MMM Eyes: PERRL, EOMI, conjunctivae normal, No scleral icterus.  Cardiovascular: Regular, tachycardic, S1 normal, S2 normal, no MRG, pulses symmetric and intact bilaterally Pulmonary/Chest: CTAB, no wheezes, rales, or rhonchi Abdominal: Soft. Non-tender, non-distended, bowel sounds are normal, no masses, organomegaly, or guarding present.  Musculoskeletal: Crepitus on left knee flexion and extension  Neurological: A&O x3, Strength is normal and symmetric bilaterally, cranial nerve II-XII are grossly intact, no focal motor deficit, sensory  intact to light touch bilaterally.  patient is able to spell world forwards, but not backwards. He is able to subtract 7 from 100, but not 7 from 93. He is able to draw clock, but it is crowded. He is able to repeat "no if's and's or buts.", He is also able to repeat "house" "car" and "pen" but cannot recall those 3 items; when asked what he would do if he found a stamped, addressed envelope on the ground, he reported he would throw it away Skin: Warm, dry and intact. No rash, cyanosis, or clubbing.     Assessment & Plan:   Case and care discussed with Dr. Josem Kaufmann. Patient to return in 6 months for routine followup of diabetes and hypertension. Patient to schedule colonoscopy in the meanwhile. Please see problem oriented turning for further details.

## 2011-08-27 NOTE — Assessment & Plan Note (Signed)
Seems to be doing well - sleeping well with Risperdal, hydroxyzine, and amitriptyline. Has not made a mess of his wife's longings recently.  -Continue current medical management -Refer to social work, and I suspect wife could use of assistance, as she is the primary caregiver

## 2011-09-05 ENCOUNTER — Other Ambulatory Visit: Payer: Self-pay | Admitting: Internal Medicine

## 2011-10-07 ENCOUNTER — Ambulatory Visit: Payer: Medicare Other | Admitting: Gastroenterology

## 2011-10-08 ENCOUNTER — Other Ambulatory Visit: Payer: Self-pay | Admitting: Internal Medicine

## 2011-10-08 NOTE — Addendum Note (Signed)
Addended by: Neomia Dear on: 10/08/2011 03:22 PM   Modules accepted: Orders

## 2011-10-23 ENCOUNTER — Other Ambulatory Visit: Payer: Self-pay | Admitting: Internal Medicine

## 2011-12-09 ENCOUNTER — Other Ambulatory Visit: Payer: Self-pay | Admitting: Internal Medicine

## 2011-12-09 NOTE — Telephone Encounter (Signed)
Needs Feb appt with Dr Everardo Beals.

## 2011-12-15 DEATH — deceased

## 2012-01-26 ENCOUNTER — Emergency Department (HOSPITAL_COMMUNITY): Payer: No Typology Code available for payment source

## 2012-01-26 ENCOUNTER — Encounter (HOSPITAL_COMMUNITY): Payer: Self-pay | Admitting: Emergency Medicine

## 2012-01-26 ENCOUNTER — Emergency Department (HOSPITAL_COMMUNITY)
Admission: EM | Admit: 2012-01-26 | Discharge: 2012-01-26 | Disposition: A | Discharge status: Home / Self Care | Payer: No Typology Code available for payment source | Attending: Emergency Medicine | Admitting: Emergency Medicine

## 2012-01-26 DIAGNOSIS — Z7982 Long term (current) use of aspirin: Secondary | ICD-10-CM | POA: Insufficient documentation

## 2012-01-26 DIAGNOSIS — F329 Major depressive disorder, single episode, unspecified: Secondary | ICD-10-CM | POA: Insufficient documentation

## 2012-01-26 DIAGNOSIS — E119 Type 2 diabetes mellitus without complications: Secondary | ICD-10-CM | POA: Insufficient documentation

## 2012-01-26 DIAGNOSIS — F3289 Other specified depressive episodes: Secondary | ICD-10-CM | POA: Insufficient documentation

## 2012-01-26 DIAGNOSIS — I129 Hypertensive chronic kidney disease with stage 1 through stage 4 chronic kidney disease, or unspecified chronic kidney disease: Secondary | ICD-10-CM | POA: Insufficient documentation

## 2012-01-26 DIAGNOSIS — Z041 Encounter for examination and observation following transport accident: Secondary | ICD-10-CM

## 2012-01-26 DIAGNOSIS — S8990XA Unspecified injury of unspecified lower leg, initial encounter: Secondary | ICD-10-CM | POA: Insufficient documentation

## 2012-01-26 DIAGNOSIS — S99929A Unspecified injury of unspecified foot, initial encounter: Secondary | ICD-10-CM | POA: Insufficient documentation

## 2012-01-26 DIAGNOSIS — G3109 Other frontotemporal dementia: Secondary | ICD-10-CM | POA: Insufficient documentation

## 2012-01-26 DIAGNOSIS — Z8673 Personal history of transient ischemic attack (TIA), and cerebral infarction without residual deficits: Secondary | ICD-10-CM | POA: Insufficient documentation

## 2012-01-26 DIAGNOSIS — M79672 Pain in left foot: Secondary | ICD-10-CM

## 2012-01-26 DIAGNOSIS — D649 Anemia, unspecified: Secondary | ICD-10-CM | POA: Insufficient documentation

## 2012-01-26 DIAGNOSIS — Z79899 Other long term (current) drug therapy: Secondary | ICD-10-CM | POA: Insufficient documentation

## 2012-01-26 DIAGNOSIS — E785 Hyperlipidemia, unspecified: Secondary | ICD-10-CM | POA: Insufficient documentation

## 2012-01-26 DIAGNOSIS — N189 Chronic kidney disease, unspecified: Secondary | ICD-10-CM | POA: Insufficient documentation

## 2012-01-26 DIAGNOSIS — Y92410 Unspecified street and highway as the place of occurrence of the external cause: Secondary | ICD-10-CM | POA: Insufficient documentation

## 2012-01-26 DIAGNOSIS — F0280 Dementia in other diseases classified elsewhere without behavioral disturbance: Secondary | ICD-10-CM | POA: Insufficient documentation

## 2012-01-26 DIAGNOSIS — Z87891 Personal history of nicotine dependence: Secondary | ICD-10-CM | POA: Insufficient documentation

## 2012-01-26 DIAGNOSIS — Y9319 Activity, other involving water and watercraft: Secondary | ICD-10-CM | POA: Insufficient documentation

## 2012-01-26 LAB — GLUCOSE, CAPILLARY: Glucose-Capillary: 121 mg/dL — ABNORMAL HIGH (ref 70–99)
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
## 2012-01-26 NOTE — ED Notes (Signed)
Pt in xray
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
## 2012-02-05 ENCOUNTER — Ambulatory Visit: Payer: Medicare Other | Admitting: Internal Medicine

## 2012-02-07 ENCOUNTER — Ambulatory Visit (INDEPENDENT_AMBULATORY_CARE_PROVIDER_SITE_OTHER): Payer: Medicare Other

## 2012-02-07 ENCOUNTER — Other Ambulatory Visit: Payer: Self-pay | Admitting: Internal Medicine

## 2012-02-07 ENCOUNTER — Ambulatory Visit (INDEPENDENT_AMBULATORY_CARE_PROVIDER_SITE_OTHER): Payer: Self-pay | Admitting: Internal Medicine

## 2012-02-07 DIAGNOSIS — Z23 Encounter for immunization: Secondary | ICD-10-CM

## 2012-02-07 DIAGNOSIS — M79609 Pain in unspecified limb: Secondary | ICD-10-CM

## 2012-02-07 MED ORDER — TRAMADOL HCL 50 MG PO TABS: 50.0000 mg | ORAL_TABLET | Freq: Four times a day (QID) | ORAL | Status: DC | PRN

## 2012-02-07 NOTE — Patient Instructions (Signed)
You have muscle strain in your shoulder muscles. We would like you to try tramadol for the pain and you can take 1 pill every 6 hours. We would also like you to try warm compresses or showers to help the muscles relax. If you are still having pain in 3-4 weeks please call us to come back. Otherwise come back with your regular doctor as scheduled.

## 2012-02-09 ENCOUNTER — Encounter: Payer: Self-pay | Admitting: Internal Medicine
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
## 2012-02-13 ENCOUNTER — Other Ambulatory Visit: Payer: Self-pay | Admitting: *Deleted

## 2012-02-13 ENCOUNTER — Other Ambulatory Visit: Payer: Self-pay | Admitting: Internal Medicine

## 2012-02-13 DIAGNOSIS — E785 Hyperlipidemia, unspecified: Secondary | ICD-10-CM

## 2012-02-13 MED ORDER — ATORVASTATIN CALCIUM 40 MG PO TABS: 40.0000 mg | ORAL_TABLET | Freq: Every day | ORAL | Status: DC

## 2012-02-14 NOTE — Telephone Encounter (Signed)
Atorvastatin rx called to [facility] pharmacy.

## 2012-02-20 ENCOUNTER — Ambulatory Visit (HOSPITAL_COMMUNITY)
Admission: RE | Admit: 2012-02-20 | Discharge: 2012-02-20 | Disposition: A | Discharge status: Home / Self Care | Payer: Medicare Other | Source: Ambulatory Visit | Attending: Internal Medicine | Admitting: Internal Medicine

## 2012-02-20 ENCOUNTER — Ambulatory Visit (INDEPENDENT_AMBULATORY_CARE_PROVIDER_SITE_OTHER): Payer: Medicare Other | Admitting: Internal Medicine

## 2012-02-20 ENCOUNTER — Encounter: Payer: Self-pay | Admitting: Internal Medicine

## 2012-02-20 VITALS — BP 137/74 | HR 96 | Temp 98.0°F | Resp 20 | Ht 66.5 in | Wt 262.0 lb

## 2012-02-20 DIAGNOSIS — E785 Hyperlipidemia, unspecified: Secondary | ICD-10-CM

## 2012-02-20 DIAGNOSIS — R0602 Shortness of breath: Secondary | ICD-10-CM | POA: Insufficient documentation

## 2012-02-20 DIAGNOSIS — R05 Cough: Secondary | ICD-10-CM | POA: Insufficient documentation

## 2012-02-20 DIAGNOSIS — J398 Other specified diseases of upper respiratory tract: Secondary | ICD-10-CM | POA: Insufficient documentation

## 2012-02-20 DIAGNOSIS — E119 Type 2 diabetes mellitus without complications: Secondary | ICD-10-CM

## 2012-02-20 DIAGNOSIS — I10 Essential (primary) hypertension: Secondary | ICD-10-CM | POA: Insufficient documentation

## 2012-02-20 DIAGNOSIS — Z79899 Other long term (current) drug therapy: Secondary | ICD-10-CM

## 2012-02-20 DIAGNOSIS — Z8673 Personal history of transient ischemic attack (TIA), and cerebral infarction without residual deficits: Secondary | ICD-10-CM | POA: Insufficient documentation

## 2012-02-20 DIAGNOSIS — J988 Other specified respiratory disorders: Secondary | ICD-10-CM | POA: Insufficient documentation

## 2012-02-20 DIAGNOSIS — R059 Cough, unspecified: Secondary | ICD-10-CM | POA: Insufficient documentation

## 2012-02-20 LAB — LIPID PANEL
LDL Cholesterol: 52 mg/dL (ref 0–99)
Triglycerides: 121 mg/dL (ref <150)
VLDL: 24 mg/dL (ref 0–40)

## 2012-02-20 LAB — BASIC METABOLIC PANEL WITH GFR
CO2: 25 mEq/L (ref 19–32)
Chloride: 106 mEq/L (ref 96–112)
Creat: 1.17 mg/dL (ref 0.50–1.35)
Potassium: 4.2 mEq/L (ref 3.5–5.3)

## 2012-02-20 LAB — GLUCOSE, CAPILLARY: Glucose-Capillary: 119 mg/dL — ABNORMAL HIGH (ref 70–99)

## 2012-02-20 LAB — POCT GLYCOSYLATED HEMOGLOBIN (HGB A1C): Hemoglobin A1C: 6.3

## 2012-02-20 NOTE — Progress Notes (Signed)
Subjective:   Patient ID: [name] [name] male   DOB: [birth year] 62 y.o.   MRN: [id number]  HPI:[name] [name] is a 62 y.o.  male with past medical history significant as outlined below who presented to the clinic with upper respirator symptoms appear. This has been going on for a couple of weeks now. The wife noted since he had the stroke he not able to understand whatever is going. She reports that he has been coughing, non-productive, was complaining about  more SOB,  Has been more sleepy and noted generalized weakness. Goes out to [employer] to meet with friends on a regular basis. He had his influenza vaccination as well as pneumococcus vaccination    Past Medical History  Diagnosis Date  . Stroke     3 strokes (2004, 2006, 2007)  . Diabetes mellitus   . Depression   . Hypertension   . ANEMIA, NORMOCYTIC, CHRONIC 11/17/2007    Qualifier: Diagnosis of  By: Elvera Lennox MD, Silvestre Mesi    . Dementia of frontal lobe type 12/17/2010  . HYPERLIPIDEMIA 04/24/2006    Qualifier: Diagnosis of  By: Elvera Lennox MD, Silvestre Mesi    . RENAL INSUFFICIENCY, CHRONIC 04/25/2006    Qualifier: Diagnosis of  By: Elvera Lennox MD, Silvestre Mesi     Current Outpatient Prescriptions  Medication Sig Dispense Refill  . amitriptyline (ELAVIL) 50 MG tablet TAKE 1 TABLET BY MOUTH EVERY NIGHT AT BEDTIME  30 tablet  3  . amLODipine (NORVASC) 10 MG tablet TAKE 1 TABLET BY MOUTH DAILY  90 tablet  1  . atorvastatin (LIPITOR) 40 MG tablet Take 1 tablet (40 mg total) by mouth daily.  90 tablet  2  . Blood Glucose Monitoring Suppl (TRUETRACK BLOOD GLUCOSE) W/DEVICE KIT 1 kit by Does not apply route 3 (three) times daily before meals.  1 kit  0  . dipyridamole-aspirin (AGGRENOX) 25-200 MG per 12 hr capsule Take 1 capsule by mouth 2 (two) times daily.  60 capsule  11  . glucose blood (FREESTYLE TEST STRIPS) test strip Check blood sugar once a day  100 each  11  . glucose blood (TRUETRACK TEST) test strip Use as instructed  100 each  12  .  guaiFENesin (MUCINEX) 600 MG 12 hr tablet Take 1,200 mg by mouth 2 (two) times daily.      Marland Kitchen losartan (COZAAR) 100 MG tablet Take 1 tablet (100 mg total) by mouth daily.  90 tablet  2  . metFORMIN (GLUCOPHAGE) 500 MG tablet Take 1 tablet (500 mg total) by mouth 2 (two) times daily with a meal.  60 tablet  11  . omeprazole (PRILOSEC) 20 MG capsule Take 1 capsule (20 mg total) by mouth daily.  30 capsule  11  . risperiDONE (RISPERDAL) 1 MG tablet TAKE 1 TABLET BY MOUTH TWICE DAILY  60 tablet  4  . traMADol (ULTRAM) 50 MG tablet Take 1 tablet (50 mg total) by mouth every 6 (six) hours as needed for pain.  40 tablet  0  . TRUEPLUS LANCETS 33G MISC USE AS DIRECTED  100 each  3   Family History  Problem Relation Age of Onset  . Diabetes Maternal Grandmother    History   Social History  . Marital Status: Married    Spouse Name: N/A    Number of Children: N/A  . Years of Education: N/A   Social History Main Topics  . Smoking status: Former Smoker    Quit date: 04/29/2005  . Smokeless tobacco: None  .  Alcohol Use: No  . Drug Use: No  . Sexually Active: None   Other Topics Concern  . None   Social History Narrative   Patient lives at home with his wife. His son helps keep an eye on him while the wife is at work.   Review of Systems: Constitutional: Denies fever, chills, diaphoresis, appetite change and fatigue.  HEENT: Denies photophobia, eye pain, redness, hearing loss, ear pain, congestion, sore throat, rhinorrhea, sneezing, mouth sores, trouble swallowing, neck pain, neck stiffness and tinnitus.   Respiratory: Denies SOB, DOE, cough, chest tightness,  and wheezing.   Cardiovascular: Denies chest pain, palpitations and leg swelling.  Gastrointestinal: Denies nausea, vomiting, abdominal pain, diarrhea, constipation, blood in stool and abdominal distention.  Genitourinary: Denies dysuria, urgency, frequency, hematuria, flank pain and difficulty urinating.  Musculoskeletal: Denies  myalgias, back pain, joint swelling, arthralgias and gait problem.  Skin: Denies pallor, rash and wound.  Neurological: Denies dizziness, seizures, syncope, weakness, light-headedness, numbness and headaches.  Hematological: Denies adenopathy. Easy bruising, personal or family bleeding history  Psychiatric/Behavioral: Denies suicidal ideation, mood changes, confusion, nervousness, sleep disturbance and agitation  Objective:  Physical Exam: Filed Vitals:   02/20/12 1500 02/20/12 1504  BP: 126/77 137/74  Pulse: 96 96  Temp: 98 F (36.7 C)   TempSrc: Oral   Resp: 20   Height: 5' 6.5" (1.689 m)   Weight: 262 lb (118.842 kg)   SpO2: 96% 96%   Constitutional: Vital signs reviewed.  Patient is a well-developed and well-nourished male in no acute distress and cooperative with exam. Alert and oriented x3.  Mouth: no erythema or exudates, MMM Eyes: PERRL, EOMI, conjunctivae normal, No scleral icterus.  Neck: Supple,  Cardiovascular: RRR, S1 normal, S2 normal, no MRG, pulses symmetric and intact bilaterally Pulmonary/Chest: rhonchi at the basis , decreased air movement  Abdominal: Soft. Non-tender, non-distended, bowel sounds are normal,  Hematology: no cervical adenopathy.  Neurological: A&O x3, Strength is normal and symmetric bilaterally, cranial nerve II-XII are grossly intact,

## 2012-02-20 NOTE — Assessment & Plan Note (Signed)
Hemoglobin A1c today 6.3. I will continue with metformin 500 twice a day.

## 2012-02-20 NOTE — Assessment & Plan Note (Signed)
Obtained lipid panel today. 

## 2012-02-20 NOTE — Patient Instructions (Signed)
Please call the clinic or go the Emergency room if your symptoms worsens Please bring all your medication and your meter at the next office visit.

## 2012-02-20 NOTE — Assessment & Plan Note (Signed)
Viral versus bacterial bronchitis versus pneumonia. Patient has no history of dysphagia. I'll obtain a chest x-ray and manage accordingly. Informed patient and wife if symptoms worsen to call the clinic or go to the emergency room for further evaluation and management.

## 2012-02-20 NOTE — Assessment & Plan Note (Signed)
Blood pressure well controlled. Continue Norvasc 10 mg and  losartan 100 mg daily

## 2012-03-08 ENCOUNTER — Other Ambulatory Visit: Payer: Self-pay | Admitting: Internal Medicine

## 2012-03-18 ENCOUNTER — Encounter: Payer: Self-pay | Admitting: Gastroenterology

## 2012-03-18 ENCOUNTER — Ambulatory Visit (INDEPENDENT_AMBULATORY_CARE_PROVIDER_SITE_OTHER): Payer: Medicare Other | Admitting: Internal Medicine

## 2012-03-18 ENCOUNTER — Encounter: Payer: Self-pay | Admitting: Internal Medicine

## 2012-03-18 ENCOUNTER — Encounter: Payer: Medicare Other | Admitting: Internal Medicine

## 2012-03-18 VITALS — BP 135/86 | HR 88 | Temp 97.0°F | Wt 262.0 lb

## 2012-03-18 DIAGNOSIS — E119 Type 2 diabetes mellitus without complications: Secondary | ICD-10-CM

## 2012-03-18 DIAGNOSIS — M25511 Pain in right shoulder: Secondary | ICD-10-CM

## 2012-03-18 DIAGNOSIS — IMO0001 Reserved for inherently not codable concepts without codable children: Secondary | ICD-10-CM

## 2012-03-18 DIAGNOSIS — M7918 Myalgia, other site: Secondary | ICD-10-CM | POA: Insufficient documentation

## 2012-03-18 DIAGNOSIS — F0280 Dementia in other diseases classified elsewhere without behavioral disturbance: Secondary | ICD-10-CM

## 2012-03-18 DIAGNOSIS — N189 Chronic kidney disease, unspecified: Secondary | ICD-10-CM

## 2012-03-18 MED ORDER — DICLOFENAC SODIUM 1 % TD GEL: 2.0000 g | Freq: Four times a day (QID) | TRANSDERMAL | Status: DC
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
## 2012-03-18 NOTE — Assessment & Plan Note (Signed)
[employer] - recalls 3/3 items and clock drawing improved (except timing 10:15 instead of 10:45 and minimal crowding- scanned in).  I suspect his sleepiness/fatigue is related to dementia, and may improve with group activities, which he will soon partake in at the [facility]. His wife is still not interested in [facility] as she does not want to change his primary MD.  Symptoms still relatively acute, if they persist, then check TSH at follow up.

## 2012-03-18 NOTE — Patient Instructions (Signed)
General Instructions: -I think your energy will improve as you start going to the [facility] programs  -For your muscle pains, I am prescribing voltaren gel which you can apply up to 4 times daily.  For now, we will discontinue tramadol, as there are some medication interactions.  -You sugars are well controlled, keep up the great work!  -Today, we scheduled you for your colonoscopy.  Please be sure to bring all of your medications with you to every visit.  Should you have any new or worsening symptoms, please be sure to call the clinic at [phone].   Progress Toward Treatment Goals:  Treatment Goal 03/18/2012  Blood pressure deteriorated  Other  unchanged    Self Care Goals & Plans:  Self Care Goal 03/18/2012  Manage my medications take my medicines as prescribed; bring my medications to every visit; refill my medications on time  Monitor my health keep track of my blood glucose; bring my glucose meter and log to each visit  Eat healthy foods eat baked foods instead of fried foods; eat foods that are low in salt    Home Blood Glucose Monitoring 03/18/2012  Check my blood sugar once a day

## 2012-03-18 NOTE — Assessment & Plan Note (Addendum)
Lab Results  Component Value Date   HGBA1C 6.3 02/20/2012   HGBA1C 6.3 08/27/2011   HGBA1C 6.2 01/21/2011     Assessment:  Diabetes control: good control (HgbA1C at goal)  Progress toward A1C goal:   at goal  Plan:  Medications:  continue current medications  Home glucose monitoring:   Frequency: once a day   Instruction/counseling given: reminded to bring blood glucose meter & log to each visit, reminded to bring medications to each visit and discussed foot care Reminded to get eye exam  Educational resources provided: brochure  Self management tools provided: home glucose logbook COLLECT URINE AT NEXT VISIT FOR MICROALBUMINURIA .
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
## 2012-03-23 ENCOUNTER — Telehealth: Payer: Self-pay | Admitting: *Deleted

## 2012-03-23 ENCOUNTER — Ambulatory Visit (AMBULATORY_SURGERY_CENTER): Payer: Medicare Other | Admitting: *Deleted

## 2012-03-23 VITALS — Ht 61.0 in | Wt 252.0 lb

## 2012-03-23 DIAGNOSIS — Z1211 Encounter for screening for malignant neoplasm of colon: Secondary | ICD-10-CM

## 2012-03-23 MED ORDER — NA SULFATE-K SULFATE-MG SULF 17.5-3.13-1.6 GM/177ML PO SOLN: ORAL | Status: DC

## 2012-03-23 NOTE — Progress Notes (Signed)
Phone note sent today to Dr.Kaplan..? Should patient hold Aggrenox x5 days?

## 2012-03-23 NOTE — Telephone Encounter (Signed)
Patient is direct for screening colonoscopy. Patient had rescheduled this from April 2012. Patient is on Aggrenox for hx of strokes. See phone note 04-18-10 patient was told to hold Aggrenox x 5 days. Should patient hold Aggrenox for 5 days? Thanks

## 2012-03-24 NOTE — Telephone Encounter (Signed)
yes

## 2012-03-24 NOTE — Telephone Encounter (Signed)
Talked with pt's wife.  Pt aware to stop Aggrenox 5 days before procedure.

## 2012-04-06 ENCOUNTER — Other Ambulatory Visit: Payer: Self-pay | Admitting: Gastroenterology

## 2012-04-06 ENCOUNTER — Ambulatory Visit (AMBULATORY_SURGERY_CENTER): Payer: Medicare Other | Admitting: Gastroenterology

## 2012-04-06 ENCOUNTER — Encounter: Payer: Self-pay | Admitting: Gastroenterology

## 2012-04-06 VITALS — BP 148/92 | HR 76 | Temp 97.2°F | Resp 24 | Ht 61.0 in | Wt 252.0 lb

## 2012-04-06 DIAGNOSIS — D126 Benign neoplasm of colon, unspecified: Secondary | ICD-10-CM

## 2012-04-06 DIAGNOSIS — Z1211 Encounter for screening for malignant neoplasm of colon: Secondary | ICD-10-CM

## 2012-04-06 LAB — GLUCOSE, CAPILLARY: Glucose-Capillary: 96 mg/dL (ref 70–99)

## 2012-04-06 MED ORDER — SODIUM CHLORIDE 0.9 % IV SOLN: 500.0000 mL | INTRAVENOUS | Status: DC

## 2012-04-06 NOTE — Progress Notes (Signed)
Patient did not experience any of the following events: a burn prior to discharge; a fall within the facility; wrong site/side/patient/procedure/implant event; or a hospital transfer or hospital admission upon discharge from the facility. (G8907) Patient did not have preoperative order for IV antibiotic SSI prophylaxis. (G8918)  

## 2012-04-06 NOTE — Op Note (Signed)
[facility] [address] [place], [address]   COLONOSCOPY PROCEDURE REPORT  PATIENT: [name] [name], [name] [name]  MR#: [id number] BIRTHDATE: February 26, 1950 , 61  yrs. old GENDER: Male ENDOSCOPIST: Louis Meckel, MD REFERRED BY: PROCEDURE DATE:  04/06/2012 PROCEDURE:   Colonoscopy with biopsy ASA CLASS:   Class III INDICATIONS:average risk screening. MEDICATIONS: MAC sedation, administered by CRNA and Propofol (Diprivan) 270 mg IV  DESCRIPTION OF PROCEDURE:   After the risks benefits and alternatives of the procedure were thoroughly explained, informed consent was obtained.  A digital rectal exam revealed no abnormalities of the rectum.   The     endoscope was introduced through the anus and advanced to the cecum, which was identified by both the appendix and ileocecal valve. No adverse events experienced.   The quality of the prep was Suprep fair  The instrument was then slowly withdrawn as the colon was fully examined.      COLON FINDINGS: A flat polyp measuring 20 mm in size was found at the cecum.  A biopsy of the area was performed.   The colon mucosa was otherwise normal.  Retroflexed views revealed no abnormalities. The time to cecum=2 minutes 26 seconds.  Withdrawal time=24 minutes 50 seconds.  The scope was withdrawn and the procedure completed. COMPLICATIONS: There were no complications.  ENDOSCOPIC IMPRESSION: 1.   Flat polyp measuring 20 mm in size was found at the cecum; biopsy of the area was performed 2.   The colon mucosa was otherwise normal  RECOMMENDATIONS: Await biopsy results OV 2-3 weeks   eSigned:  Louis Meckel, MD 04/06/2012 12:02 PM   cc: Stacy Gardner, MD

## 2012-04-06 NOTE — Patient Instructions (Addendum)
Resume Aggrenox in a.m.  1 large polyp biopsied and sent to pathology.  Await results.  Dr Marzetta Board nurse will call you to schedule office appointment for 2-3 weeks.    YOU HAD AN ENDOSCOPIC PROCEDURE TODAY AT THE [facility]: Refer to the procedure report that was given to you for any specific questions about what was found during the examination.  If the procedure report does not answer your questions, please call your gastroenterologist to clarify.  If you requested that your care partner not be given the details of your procedure findings, then the procedure report has been included in a sealed envelope for you to review at your convenience later.  YOU SHOULD EXPECT: Some feelings of bloating in the abdomen. Passage of more gas than usual.  Walking can help get rid of the air that was put into your GI tract during the procedure and reduce the bloating. If you had a lower endoscopy (such as a colonoscopy or flexible sigmoidoscopy) you may notice spotting of blood in your stool or on the toilet paper. If you underwent a bowel prep for your procedure, then you may not have a normal bowel movement for a few days.  DIET: Your first meal following the procedure should be a light meal and then it is ok to progress to your normal diet.  A half-sandwich or bowl of soup is an example of a good first meal.  Heavy or fried foods are harder to digest and may make you feel nauseous or bloated.  Likewise meals heavy in dairy and vegetables can cause extra gas to form and this can also increase the bloating.  Drink plenty of fluids but you should avoid alcoholic beverages for 24 hours.  ACTIVITY: Your care partner should take you home directly after the procedure.  You should plan to take it easy, moving slowly for the rest of the day.  You can resume normal activity the day after the procedure however you should NOT DRIVE or use heavy machinery for 24 hours (because of the sedation medicines used during  the test).    SYMPTOMS TO REPORT IMMEDIATELY: A gastroenterologist can be reached at any hour.  During normal business hours, 8:30 AM to 5:00 PM Monday through Friday, call [phone].  After hours and on weekends, please call the GI answering service at [phone] who will take a message and have the physician on call contact you.   Following lower endoscopy (colonoscopy or flexible sigmoidoscopy):  Excessive amounts of blood in the stool  Significant tenderness or worsening of abdominal pains  Swelling of the abdomen that is new, acute  Fever of 100F or higher   FOLLOW UP: If any biopsies were taken you will be contacted by phone or by letter within the next 1-3 weeks.  Call your gastroenterologist if you have not heard about the biopsies in 3 weeks.  Our staff will call the home number listed on your records the next business day following your procedure to check on you and address any questions or concerns that you may have at that time regarding the information given to you following your procedure. This is a courtesy call and so if there is no answer at the home number and we have not heard from you through the emergency physician on call, we will assume that you have returned to your regular daily activities without incident.  SIGNATURES/CONFIDENTIALITY: You and/or your care partner have signed paperwork which will be entered into your  electronic medical record.  These signatures attest to the fact that that the information above on your After Visit Summary has been reviewed and is understood.  Full responsibility of the confidentiality of this discharge information lies with you and/or your care-partner.

## 2012-04-06 NOTE — Progress Notes (Signed)
Called to room to assist during endoscopic procedure.  Patient ID and intended procedure confirmed with present staff. Received instructions for my participation in the procedure from the performing physician.  

## 2012-04-07 ENCOUNTER — Other Ambulatory Visit: Payer: Self-pay | Admitting: Internal Medicine

## 2012-04-07 ENCOUNTER — Telehealth: Payer: Self-pay | Admitting: *Deleted

## 2012-04-07 NOTE — Telephone Encounter (Signed)
Number was for [name] [name], did not leave message, follow-up

## 2012-04-08 ENCOUNTER — Other Ambulatory Visit: Payer: Self-pay | Admitting: Internal Medicine

## 2012-04-09 ENCOUNTER — Other Ambulatory Visit: Payer: Self-pay | Admitting: Internal Medicine

## 2012-04-13 ENCOUNTER — Ambulatory Visit (INDEPENDENT_AMBULATORY_CARE_PROVIDER_SITE_OTHER): Payer: Medicare Other | Admitting: Internal Medicine

## 2012-04-13 ENCOUNTER — Encounter: Payer: Self-pay | Admitting: Internal Medicine

## 2012-04-13 VITALS — BP 140/88 | HR 99 | Temp 97.3°F | Wt 258.4 lb

## 2012-04-13 DIAGNOSIS — IMO0001 Reserved for inherently not codable concepts without codable children: Secondary | ICD-10-CM

## 2012-04-13 DIAGNOSIS — M7918 Myalgia, other site: Secondary | ICD-10-CM

## 2012-04-13 MED ORDER — CYCLOBENZAPRINE HCL 5 MG PO TABS: 5.0000 mg | ORAL_TABLET | Freq: Three times a day (TID) | ORAL | Status: DC | PRN
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
## 2012-04-13 NOTE — Patient Instructions (Addendum)
1. You can take tramadol for the pain 2-3 times per day. If that does not help, he could try over-the-counter ibuprofen 400 mg 2 times a day. With your kidney disease, it is important not to take too much ibuprofen. Do not take this medication longer than 2 weeks. I'm going to prescribe a medicine that will help with your muscle spasm. This medicine is on $4 to [facility] and is called Flexeril (cylcobenzaprine). You can take it 2-3 times per day for the next couple of weeks. In this medication makes you too groggy, please stop taking it. 2. Come back and see your primary doctor in 1-2 months.     Heat Therapy Heat therapy can help ease achy, tense, stiff, and tight muscles and joints. Heat should not be used on new injuries. Wait at least 48 hours after the injury before using heat therapy. Heat also should not be used for discomfort or pain that occurs right after doing an activity. If you still have pain or stiffness 3 hours after finishing the activity, then heat therapy may be used. PRECAUTIONS  High heat or prolonged exposure to heat can cause burns. Be careful when using heat therapy to avoid burning your skin. If you have any of the following conditions, do not use heat until you have discussed heat therapy with your caregiver:  Poor circulation.  Healing wounds or scarred skin in the area being treated.  Diabetes, heart disease, or high blood pressure.  Numbness of the area being treated.  Unusual swelling of the area being treated.  Active infections.  Blood clots.  Cancer.  Inability to communicate your response to pain. This can include young children and people with dementia. HOME CARE INSTRUCTIONS Moist heat pack  Soak a clean towel in warm water, and squeeze out the extra water. The water temperature should be comfortable to the skin.  Put the warm, wet towel in a plastic bag.  Place a thin, dry towel between your skin and the bag.  Put the heat pack on the area for  5 minutes, and check your skin. Your skin may be pink, but it should not be red.  Leave the heat pack on the area for a total of 15 to 30 minutes.  Repeat this every 2 to 4 hours while awake. Do not use heat while you are sleeping. Warm water bath  Fill a tub with warm water. The water temperature should be comfortable to the skin.  Place the affected body part in the tub.  Soak the area for 20 to 40 minutes.  Repeat as needed. Hot water bottle  Fill the water bottle half full with hot water.  Press out the extra air. Close the cap tightly.  Place a dry towel between your skin and the bottle.  Put the bottle on the area for 5 minutes, and check your skin. Your skin may be pink, but it should not be red.  Leave the bottle on the area for a total of 15 to 30 minutes.  Repeat this every 2 to 4 hours while awake. Electric heating pad  Place a dry towel between your skin and the heating pad.  Set the heating pad on low heat.  Put the heating pad on the area for 10 minutes, and check your skin. Your skin may be pink, but it should not be red.  Leave the heating pad on the area for a total of 20 to 40 minutes.  Repeat this every 2 to  4 hours while awake.  Do not lie on the heating pad.  Do not fall asleep while using the heating pad.  Do not use the heating pad near water. Contact with water can result in an electrical shock. SEEK MEDICAL CARE IF:  You have blisters, redness, swelling, or numbness.  You have any new problems.  Your problems are getting worse.  You have any questions or concerns. If you develop any problems, stop using heat therapy until you see your caregiver. MAKE SURE YOU:  Understand these instructions.  Will watch your condition.  Will get help right away if you are not doing well or get worse. Document Released: 03/25/2011 Document Reviewed: 03/25/2011 [facility] Patient Information 2013 [place], [place].

## 2012-04-13 NOTE — Assessment & Plan Note (Signed)
Patient unable to afford topical voltaren gel that was recommended and visit earlier this month. Continues to have tightness of trapezius muscles. He continues to use tramadol 2 to 3 times a day. From his history and exam, it seems that muscle spasm is contributing to his discomfort. I prescribed a trial Flexeril for him. I educated the patient and his wife about the side effects of this medication, particularly grogginess which may be an issue for him giving his baseline cognitive decline. I educated them about heat therapy for muscle tension and provided literature for them to review. I cautioned to avoid NSAIDs if possible with his chronic renal disease. His renal disease is not absolute contra-indication to NSAIDs, but I told them not to take ibuprofen regularly. -He will followup in a month or 2 with his primary care Dr.

## 2012-04-21 ENCOUNTER — Encounter: Payer: Self-pay | Admitting: Internal Medicine

## 2012-04-21 DIAGNOSIS — R269 Unspecified abnormalities of gait and mobility: Secondary | ICD-10-CM | POA: Insufficient documentation

## 2012-05-06 ENCOUNTER — Other Ambulatory Visit: Payer: Self-pay | Admitting: Internal Medicine

## 2012-05-06 MED ORDER — METFORMIN HCL 500 MG PO TABS: 500.0000 mg | ORAL_TABLET | Freq: Two times a day (BID) | ORAL | Status: DC

## 2012-05-11 ENCOUNTER — Encounter: Payer: Self-pay | Admitting: Gastroenterology

## 2012-05-11 ENCOUNTER — Telehealth: Payer: Self-pay | Admitting: *Deleted

## 2012-05-11 ENCOUNTER — Ambulatory Visit (INDEPENDENT_AMBULATORY_CARE_PROVIDER_SITE_OTHER): Payer: Medicare Other | Admitting: Gastroenterology

## 2012-05-11 VITALS — BP 144/78 | HR 88 | Ht 66.5 in | Wt 244.0 lb

## 2012-05-11 DIAGNOSIS — D126 Benign neoplasm of colon, unspecified: Secondary | ICD-10-CM

## 2012-05-11 MED ORDER — NA SULFATE-K SULFATE-MG SULF 17.5-3.13-1.6 GM/177ML PO SOLN: 1.0000 | Freq: Once | ORAL | Status: DC

## 2012-05-11 NOTE — Assessment & Plan Note (Addendum)
Removing the cecal polyp is a higher risk procedure because it is large and sessile. Higher risk for bleeding and perforation were discussed with the patient including alternative therapy including surgery. Patient wishes to proceed with  colonoscopy. Aggrenox will have to be held. I discussed the higher risk for stroke while holding Aggrenox.

## 2012-05-11 NOTE — Telephone Encounter (Signed)
[facility] [address] [place] [address]  [phone] phone [phone] fax  05/11/2012    RE: AZARIA BARTELL DOB: 10-26-1950 MRN: [id number]   Dear Denton Meek,    We have scheduled the above patient for an endoscopic procedure. Our records show that he is on anticoagulation therapy.   Please advise as to how long the patient may come off his therapy of Aggrenox  prior to the procedure, which is scheduled for 5/16  Please fax back/ or route the completed form to Angelino Rumery at [phone].   Sincerely,  Merri Ray

## 2012-05-11 NOTE — Progress Notes (Signed)
History of Present Illness: 62 year-old Afro-American male with history of CVA, diabetes, hypertension, renal insufficiency, on Aggrenox, here following colonoscopy to discuss treatment of a colon polyp. A sessile cecal adenomatous polyp was noted.    Past Medical History  Diagnosis Date  . Stroke     3 strokes (2004, 2006, 2007)  . Diabetes mellitus   . Depression   . Hypertension   . ANEMIA, NORMOCYTIC, CHRONIC 11/17/2007    Qualifier: Diagnosis of  By: Elvera Lennox MD, Silvestre Mesi    . Dementia of frontal lobe type 12/17/2010  . HYPERLIPIDEMIA 04/24/2006    Qualifier: Diagnosis of  By: Elvera Lennox MD, Silvestre Mesi    . RENAL INSUFFICIENCY, CHRONIC 04/25/2006    Qualifier: Diagnosis of  By: Elvera Lennox MD, Silvestre Mesi     History reviewed. No pertinent past surgical history. family history includes Diabetes in his maternal grandmother and Stroke in his mother.  There is no history of Colon cancer. Current Outpatient Prescriptions  Medication Sig Dispense Refill  . amitriptyline (ELAVIL) 50 MG tablet Take 1 tablet (50 mg total) by mouth at bedtime.  30 tablet  11  . amLODipine (NORVASC) 10 MG tablet TAKE 1 TABLET BY MOUTH DAILY  90 tablet  1  . atorvastatin (LIPITOR) 40 MG tablet Take 1 tablet (40 mg total) by mouth daily.  90 tablet  2  . Blood Glucose Monitoring Suppl (TRUETRACK BLOOD GLUCOSE) W/DEVICE KIT 1 kit by Does not apply route 3 (three) times daily before meals.  1 kit  0  . cyclobenzaprine (FLEXERIL) 5 MG tablet Take 1 tablet (5 mg total) by mouth every 8 (eight) hours as needed for muscle spasms.  30 tablet  1  . dipyridamole-aspirin (AGGRENOX) 25-200 MG per 12 hr capsule Take 1 capsule by mouth 2 (two) times daily.  60 capsule  11  . glucose blood (FREESTYLE TEST STRIPS) test strip Check blood sugar once a day  100 each  11  . guaiFENesin (MUCINEX) 600 MG 12 hr tablet Take 1,200 mg by mouth 2 (two) times daily.      Marland Kitchen losartan (COZAAR) 100 MG tablet TAKE 1 TABLET BY MOUTH DAILY  90 tablet  0  .  metFORMIN (GLUCOPHAGE) 500 MG tablet Take 1 tablet (500 mg total) by mouth 2 (two) times daily with a meal.  180 tablet  3  . omeprazole (PRILOSEC) 20 MG capsule TAKE ONE CAPSULE BY MOUTH DAILY  30 capsule  0  . risperiDONE (RISPERDAL) 1 MG tablet TAKE 1 TABLET BY MOUTH TWICE DAILY  60 tablet  2  . TRUEPLUS LANCETS 33G MISC USE AS DIRECTED  100 each  3   No current facility-administered medications for this visit.   Allergies as of 05/11/2012 - Review Complete 05/11/2012  Allergen Reaction Noted  . Divalproex sodium  11/17/2008    reports that he quit smoking about 7 years ago. He has never used smokeless tobacco. He reports that he does not drink alcohol or use illicit drugs.     Review of Systems: Pertinent positive and negative review of systems were noted in the above HPI section. All other review of systems were otherwise negative.  Vital signs were reviewed in today's medical record Physical Exam: General: Well developed , well nourished, no acute distress Skin: anicteric Head: Normocephalic and atraumatic Eyes:  sclerae anicteric, EOMI Ears: Normal auditory acuity Mouth: No deformity or lesions Neck: Supple, no masses or thyromegaly Lungs: Clear throughout to auscultation Heart: Regular rate and rhythm; no murmurs, rubs or  bruits Abdomen: Soft, non tender and non distended. No masses, hepatosplenomegaly or hernias noted. Normal Bowel sounds Rectal:deferred Musculoskeletal: Symmetrical with no gross deformities  Skin: No lesions on visible extremities Pulses:  Normal pulses noted Extremities: No clubbing, cyanosis, edema or deformities noted Neurological: Alert oriented x 4, grossly nonfocal Cervical Nodes:  No significant cervical adenopathy Inguinal Nodes: No significant inguinal adenopathy Psychological:  Alert and cooperative. Normal mood and affect

## 2012-05-11 NOTE — Patient Instructions (Addendum)
Your colonoscopy has been scheduled Separate instructions have been given We will contact you about holding your Aggrenox

## 2012-05-18 ENCOUNTER — Telehealth: Payer: Self-pay | Admitting: Gastroenterology

## 2012-05-19 NOTE — Telephone Encounter (Signed)
Mailbox full. Need to cancel procedure at [facility] on 5/16

## 2012-05-19 NOTE — Telephone Encounter (Signed)
Mailbox full csn not leave a message need to cancel procedure

## 2012-05-20 NOTE — Telephone Encounter (Signed)
Discussed with patients wife the changes of date and times of procedure

## 2012-05-20 NOTE — Telephone Encounter (Signed)
Called to cancel pts procedure with patient and endoscopy department  L/M on patients cell phone we have rescheduled patients procedure to the 30th of may

## 2012-05-20 NOTE — Telephone Encounter (Signed)
We need advise on this pt holding his aggrenox before his scheduled procedure now changed till the 30th of may

## 2012-05-25 ENCOUNTER — Other Ambulatory Visit: Payer: Self-pay | Admitting: *Deleted

## 2012-05-25 DIAGNOSIS — I639 Cerebral infarction, unspecified: Secondary | ICD-10-CM

## 2012-05-26 MED ORDER — ASPIRIN-DIPYRIDAMOLE ER 25-200 MG PO CP12: 1.0000 | ORAL_CAPSULE | Freq: Two times a day (BID) | ORAL | Status: DC

## 2012-06-05 ENCOUNTER — Other Ambulatory Visit: Payer: Self-pay | Admitting: Internal Medicine

## 2012-06-05 ENCOUNTER — Telehealth: Payer: Self-pay | Admitting: Internal Medicine

## 2012-06-05 ENCOUNTER — Telehealth: Payer: Self-pay | Admitting: Gastroenterology

## 2012-06-05 NOTE — Telephone Encounter (Signed)
Spoke with K

## 2012-06-05 NOTE — Telephone Encounter (Signed)
Still have not heard about pts aggrenox yet/ Waiting on call

## 2012-06-05 NOTE — Telephone Encounter (Signed)
I received notification that patient is scheduled for colonoscopy with polylpectomy on 06/12/12, with concerns of if to hold aggrenox.    Per review of Dr. Marzetta Board note on 05/11/12, it appears that he plans to preform a cecal polyp that is large and sessile, for which he recommended holding aggrenox.  Per review of the American society for gastrointestinal endoscopy (ASGE), polypectomy is in fact a high bleeding risk, for which they suggest consider discontinuation for 5-7 (based on the ASA component, there is no clear indication for how long to d/c dipyramidole).  We discussed the risk of stroke with stopping medication, but patient would like to pursue polypectomy to evaluate for colon cancer.  Of note, patient has had 3 CVAs in the past.  Medication will be stopped on 06/06/12 (last dose on evening of 06/05/12).  Procedure is on 06/12/12 at 12:30.  Regarding restarting aggrenox, I suggested this be discussed with Dr. Arlyce Dice post procedure since I suspect it may partly depend on what happens during procedure.  Per ASGE, there is no consensus as to the optimal timing for resumption of antithrombotic therapy after endoscopic interventions.   The benefits of immediate reinitiation of antithrombotic therapy in preventing thromboemoblic events should be weighted against risk of hemorrhage and decision is likely to depend on procedure dependent circumstances.    I discussed all of the above with the patient's wife on 06/05/12.

## 2012-06-05 NOTE — Telephone Encounter (Signed)
Dr Everardo Beals will call pt and advise on stopping medication.

## 2012-06-05 NOTE — Telephone Encounter (Signed)
Patient is scheduled for surgery on 06/12/12.  Patient's wife wants to know what medicines he needs to stop and when.

## 2012-06-05 NOTE — Telephone Encounter (Signed)
.  called pts wife. She has not heard from her PCP about the Aggrenox yet. I have not heard from them. I have attempted to contact them all day today. Spoke with two nurses, and still have no response. . Contacted pts wife she said she is going to hold her husbands aggrenox on Sunday in case we find something out on Monday,. I DID NOT ADVISE THIS to her. She said one day will not hurt. I have been working on this since 4-28 and resent on 5/7   Hopefully the PCP will contact the patient after 5pm   Clayborne Dana is my witness that have been trying to get this approved

## 2012-06-05 NOTE — Telephone Encounter (Signed)
Pt is having Conoloscopy with removal of polyps on 5/30 by Dr Arlyce Dice .  Wife is asking if pt needs to stop aggrenox before surgery and for how long. Please advise. Pt # [id number]  Please call response to Robin # [phone]  Ex [phone]

## 2012-06-10 ENCOUNTER — Encounter: Payer: Medicare Other | Admitting: Internal Medicine

## 2012-06-10 ENCOUNTER — Telehealth: Payer: Self-pay | Admitting: Gastroenterology

## 2012-06-10 NOTE — Telephone Encounter (Signed)
Answered all questions asked by patients wife

## 2012-06-11 ENCOUNTER — Ambulatory Visit (INDEPENDENT_AMBULATORY_CARE_PROVIDER_SITE_OTHER): Payer: Medicare Other | Admitting: Internal Medicine

## 2012-06-11 ENCOUNTER — Telehealth: Payer: Self-pay | Admitting: *Deleted

## 2012-06-11 ENCOUNTER — Encounter: Payer: Self-pay | Admitting: Internal Medicine

## 2012-06-11 VITALS — BP 138/84 | HR 94 | Temp 97.4°F | Ht 66.25 in | Wt 255.9 lb

## 2012-06-11 DIAGNOSIS — I10 Essential (primary) hypertension: Secondary | ICD-10-CM

## 2012-06-11 DIAGNOSIS — M7918 Myalgia, other site: Secondary | ICD-10-CM

## 2012-06-11 DIAGNOSIS — E119 Type 2 diabetes mellitus without complications: Secondary | ICD-10-CM

## 2012-06-11 DIAGNOSIS — K219 Gastro-esophageal reflux disease without esophagitis: Secondary | ICD-10-CM

## 2012-06-11 DIAGNOSIS — IMO0001 Reserved for inherently not codable concepts without codable children: Secondary | ICD-10-CM

## 2012-06-11 MED ORDER — CYCLOBENZAPRINE HCL 5 MG PO TABS: 5.0000 mg | ORAL_TABLET | Freq: Three times a day (TID) | ORAL | Status: AC | PRN

## 2012-06-11 MED ORDER — OMEPRAZOLE 20 MG PO CPDR: 20.0000 mg | DELAYED_RELEASE_CAPSULE | Freq: Every day | ORAL | Status: DC

## 2012-06-11 NOTE — Telephone Encounter (Signed)
Louis Meckel, MD ','<More Detail >>       Louis Meckel, MD        Sent: Thu Jun 11, 2012 9:17 AM     To: Marlowe Kays, CMA                          Message    Please see if we can move [name] [name]'s procedure tomorrow from 12:30 to noon.                  Dr Arlyce Dice the pt's case will start at 12 pt notiified message left

## 2012-06-11 NOTE — Patient Instructions (Addendum)
Cyclobenzaprine tablets What is this medicine? CYCLOBENZAPRINE (sye kloe BEN za preen) is a muscle relaxer. It is used to treat muscle pain, spasms, and stiffness. This medicine may be used for other purposes; ask your health care provider or pharmacist if you have questions. What should I tell my health care provider before I take this medicine? They need to know if you have any of these conditions: -heart disease, irregular heartbeat, or previous heart attack -liver disease -thyroid problem -an unusual or allergic reaction to cyclobenzaprine, tricyclic antidepressants, lactose, other medicines, foods, dyes, or preservatives -pregnant or trying to get pregnant -breast-feeding How should I use this medicine? Take this medicine by mouth with a glass of water. Follow the directions on the prescription label. If this medicine upsets your stomach, take it with food or milk. Take your medicine at regular intervals. Do not take it more often than directed. Talk to your pediatrician regarding the use of this medicine in children. Special care may be needed. Overdosage: If you think you have taken too much of this medicine contact a poison control center or emergency room at once. NOTE: This medicine is only for you. Do not share this medicine with others. What if I miss a dose? If you miss a dose, take it as soon as you can. If it is almost time for your next dose, take only that dose. Do not take double or extra doses. What may interact with this medicine? Do not take this medicine with any of the following medications: -cisapride -droperidol -flecainide -grepafloxacin -halofantrine -levomethadyl -MAOIs like Carbex, Eldepryl, Marplan, Nardil, and Parnate -nilotinib -pimozide -probucol -sertindole This medicine may also interact with the following medications: -abarelix -alcohol -contrast dyes -dolasetron -guanethidine -medicines for cancer -medicines for depression, anxiety, or  psychotic disturbances -medicines to treat an irregular heartbeat -medicines used for sleep or numbness during surgery or procedure -methadone -octreotide -ondansetron -palonosetron -phenothiazines like chlorpromazine, mesoridazine, prochlorperazine, thioridazine -some medicines for infection like alfuzosin, chloroquine, clarithromycin, levofloxacin, mefloquine, pentamidine, troleandomycin -tramadol -vardenafil This list may not describe all possible interactions. Give your health care provider a list of all the medicines, herbs, non-prescription drugs, or dietary supplements you use. Also tell them if you smoke, drink alcohol, or use illegal drugs. Some items may interact with your medicine. What should I watch for while using this medicine? Check with your doctor or health care professional if your condition does not improve within 1 to 3 weeks. You may get drowsy or dizzy when you first start taking the medicine or change doses. Do not drive, use machinery, or do anything that may be dangerous until you know how the medicine affects you. Stand or sit up slowly. Your mouth may get dry. Drinking water, chewing sugarless gum, or sucking on hard candy may help. What side effects may I notice from receiving this medicine? Side effects that you should report to your doctor or health care professional as soon as possible: -allergic reactions like skin rash, itching or hives, swelling of the face, lips, or tongue -chest pain -fast heartbeat -hallucinations -seizures -vomiting Side effects that usually do not require medical attention (report to your doctor or health care professional if they continue or are bothersome): -headache This list may not describe all possible side effects. Call your doctor for medical advice about side effects. You may report side effects to FDA at 1-800-FDA-1088. Where should I keep my medicine? Keep out of the reach of children. Store at room temperature between  15 and 30 degrees   C (59 and 86 degrees F). Keep container tightly closed. Throw away any unused medicine after the expiration date. NOTE: This sheet is a summary. It may not cover all possible information. If you have questions about this medicine, talk to your doctor, pharmacist, or health care provider.  2013, Elsevier/Gold Standard. (04/13/2007 10:26:21 PM)  

## 2012-06-11 NOTE — Assessment & Plan Note (Addendum)
Well controlled on Prilosec. Requesting refill. - Prilosec 20mg  po daily

## 2012-06-11 NOTE — Assessment & Plan Note (Addendum)
Per wife, pain in left back and in feet on left. On exam, he is mildly unsteady on his feet, but overall has full ROM of lumbar spine and full ROM at hips. His spine is nontender to palpation. He was prescribed Flexeril at his last f/u appt in March, but did not pick up the medication. Will re-prescibe and send to [facility] on [place] per his wife's request. The patient and his wife were re-educated about the side effects of this medication, particularly grogginess which may be an issue for him giving his baseline cognitive decline. - Flexeril 5mg  po q8h PRN muscle spasms

## 2012-06-11 NOTE — Telephone Encounter (Signed)
ok 
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
## 2012-06-12 ENCOUNTER — Encounter (HOSPITAL_COMMUNITY): Payer: Self-pay | Admitting: *Deleted

## 2012-06-12 ENCOUNTER — Encounter (HOSPITAL_COMMUNITY)
Admission: RE | Disposition: A | Discharge status: Home / Self Care | Payer: Self-pay | Source: Ambulatory Visit | Attending: Gastroenterology

## 2012-06-12 ENCOUNTER — Ambulatory Visit (HOSPITAL_COMMUNITY)
Admission: RE | Admit: 2012-06-12 | Discharge: 2012-06-12 | Disposition: A | Discharge status: Home / Self Care | Payer: Medicare Other | Source: Ambulatory Visit | Attending: Gastroenterology | Admitting: Gastroenterology

## 2012-06-12 DIAGNOSIS — K635 Polyp of colon: Secondary | ICD-10-CM

## 2012-06-12 DIAGNOSIS — Z79899 Other long term (current) drug therapy: Secondary | ICD-10-CM | POA: Insufficient documentation

## 2012-06-12 DIAGNOSIS — I129 Hypertensive chronic kidney disease with stage 1 through stage 4 chronic kidney disease, or unspecified chronic kidney disease: Secondary | ICD-10-CM | POA: Insufficient documentation

## 2012-06-12 DIAGNOSIS — N189 Chronic kidney disease, unspecified: Secondary | ICD-10-CM | POA: Insufficient documentation

## 2012-06-12 DIAGNOSIS — D126 Benign neoplasm of colon, unspecified: Secondary | ICD-10-CM | POA: Insufficient documentation

## 2012-06-12 DIAGNOSIS — N289 Disorder of kidney and ureter, unspecified: Secondary | ICD-10-CM | POA: Insufficient documentation

## 2012-06-12 DIAGNOSIS — Z8673 Personal history of transient ischemic attack (TIA), and cerebral infarction without residual deficits: Secondary | ICD-10-CM | POA: Insufficient documentation

## 2012-06-12 DIAGNOSIS — E119 Type 2 diabetes mellitus without complications: Secondary | ICD-10-CM | POA: Insufficient documentation

## 2012-06-12 HISTORY — DX: Gastro-esophageal reflux disease without esophagitis: K21.9

## 2012-06-12 HISTORY — PX: HOT HEMOSTASIS: SHX5433

## 2012-06-12 HISTORY — DX: Polyp of colon: K63.5

## 2012-06-12 HISTORY — PX: COLONOSCOPY: SHX5424

## 2012-06-12 SURGERY — COLONOSCOPY
Anesthesia: Moderate Sedation

## 2012-06-12 MED ORDER — FENTANYL CITRATE 0.05 MG/ML IJ SOLN
INTRAMUSCULAR | Status: AC
  Filled 2012-06-12: qty 2

## 2012-06-12 MED ORDER — METHYLENE BLUE 1 % INJ SOLN
INTRAMUSCULAR | Status: DC | PRN
  Administered 2012-06-12: 4 mL via SUBMUCOSAL

## 2012-06-12 MED ORDER — MIDAZOLAM HCL 5 MG/5ML IJ SOLN
INTRAMUSCULAR | Status: DC | PRN
  Administered 2012-06-12 (×2): 2 mg via INTRAVENOUS

## 2012-06-12 MED ORDER — SODIUM CHLORIDE 0.9 % IV SOLN
INTRAVENOUS | Status: DC
  Administered 2012-06-12: 500 mL via INTRAVENOUS

## 2012-06-12 MED ORDER — METHYLENE BLUE 1 % INJ SOLN
INTRAMUSCULAR | Status: AC
  Filled 2012-06-12: qty 10

## 2012-06-12 MED ORDER — MIDAZOLAM HCL 10 MG/2ML IJ SOLN
INTRAMUSCULAR | Status: AC
  Filled 2012-06-12: qty 2

## 2012-06-12 MED ORDER — FENTANYL CITRATE 0.05 MG/ML IJ SOLN
INTRAMUSCULAR | Status: DC | PRN
  Administered 2012-06-12 (×2): 25 ug via INTRAVENOUS

## 2012-06-12 NOTE — Op Note (Signed)
[facility] [address] [place], [address]   COLONOSCOPY PROCEDURE REPORT  PATIENT: [name], [name]  MR#: [id number] BIRTHDATE: [birth year] , 61  yrs. old GENDER: Male ENDOSCOPIST: Louis Meckel, MD REFERRED BY: PROCEDURE DATE:  06/12/2012 PROCEDURE:   Colonoscopy with snare polypectomy, Submucosal injection, any substance, and Colonoscopy with tissue ablation ASA CLASS:   Class II INDICATIONS:therapy of for previously diagnosed polyps, colonic. MEDICATIONS: These medications were titrated to patient response per physician's verbal order, Versed 4 mg IV, and Fentanyl 50 mcg IV  DESCRIPTION OF PROCEDURE:   After the risks benefits and alternatives of the procedure were thoroughly explained, informed consent was obtained.  A digital rectal exam revealed no abnormalities of the rectum.   The Pentax Adult Colonscope [id number] endoscope was introduced through the anus and advanced to the cecum, which was identified by both the appendix and ileocecal valve. No adverse events experienced.   The quality of the prep was Suprep good  The instrument was then slowly withdrawn as the colon was fully examined.      COLON FINDINGS: Again located in the cecum was a sessile 2-3 cm polyp.  The submucosa was injected of 4 cc of methylene impregnated saline to form a cushion between the mucosa and submucosa.  The polyp was removed piecemeal with a hot polypectomy snare.  The fringes of the polyp were fulgurated utilizing the argon plasma coagulator.  The polyp was partially retrieved and submitted to pathology. In the mid transverse colon there was a 5 cm sessile polyp removed with cold polypectomy snare and submitted to pathology.  The remainder of the colon was normal.  Retroflexed views revealed no abnormalities. The time to cecum=  .  Withdrawal time=25 minutes 0 seconds.  The scope was withdrawn and the procedure completed. COMPLICATIONS: There were no  complications.  ENDOSCOPIC IMPRESSION: A   colonic polyposis  RECOMMENDATIONS: followup colonoscopy with ERBE in one year  eSigned:  Louis Meckel, MD 06/12/2012 12:28 PM   cc:  Stacy Gardner, MD

## 2012-06-12 NOTE — Progress Notes (Signed)
Case discussed with Dr. Kathryn Glenn at the time of the visit, immediately after the resident saw the patient.  I reviewed the resident's history and exam and pertinent patient test results.  I agree with the assessment, diagnosis and plan of care documented in the resident's note.     

## 2012-06-12 NOTE — H&P (Signed)
History of Present Illness: 62 year-old Afro-American male with history of CVA, diabetes, hypertension, renal insufficiency, on Aggrenox, here following colonoscopy to discuss treatment of a colon polyp. A sessile cecal adenomatous polyp was noted.        Past Medical History   Diagnosis  Date   .  Stroke         3 strokes (2004, 2006, 2007)   .  Diabetes mellitus     .  Depression     .  Hypertension     .  ANEMIA, NORMOCYTIC, CHRONIC  11/17/2007       Qualifier: Diagnosis of  By: Elvera Lennox MD, Silvestre Mesi     .  Dementia of frontal lobe type  12/17/2010   .  HYPERLIPIDEMIA  04/24/2006       Qualifier: Diagnosis of  By: Elvera Lennox MD, Silvestre Mesi     .  RENAL INSUFFICIENCY, CHRONIC  04/25/2006       Qualifier: Diagnosis of  By: Elvera Lennox MD, Silvestre Mesi        History reviewed. No pertinent past surgical history. family history includes Diabetes in his maternal grandmother and Stroke in his mother.  There is no history of Colon cancer. Current Outpatient Prescriptions   Medication  Sig  Dispense  Refill   .  amitriptyline (ELAVIL) 50 MG tablet  Take 1 tablet (50 mg total) by mouth at bedtime.   30 tablet   11   .  amLODipine (NORVASC) 10 MG tablet  TAKE 1 TABLET BY MOUTH DAILY   90 tablet   1   .  atorvastatin (LIPITOR) 40 MG tablet  Take 1 tablet (40 mg total) by mouth daily.   90 tablet   2   .  Blood Glucose Monitoring Suppl (TRUETRACK BLOOD GLUCOSE) W/DEVICE KIT  1 kit by Does not apply route 3 (three) times daily before meals.   1 kit   0   .  cyclobenzaprine (FLEXERIL) 5 MG tablet  Take 1 tablet (5 mg total) by mouth every 8 (eight) hours as needed for muscle spasms.   30 tablet   1   .  dipyridamole-aspirin (AGGRENOX) 25-200 MG per 12 hr capsule  Take 1 capsule by mouth 2 (two) times daily.   60 capsule   11   .  glucose blood (FREESTYLE TEST STRIPS) test strip  Check blood sugar once a day   100 each   11   .  guaiFENesin (MUCINEX) 600 MG 12 hr tablet  Take 1,200 mg by mouth 2  (two) times daily.         Marland Kitchen  losartan (COZAAR) 100 MG tablet  TAKE 1 TABLET BY MOUTH DAILY   90 tablet   0   .  metFORMIN (GLUCOPHAGE) 500 MG tablet  Take 1 tablet (500 mg total) by mouth 2 (two) times daily with a meal.   180 tablet   3   .  omeprazole (PRILOSEC) 20 MG capsule  TAKE ONE CAPSULE BY MOUTH DAILY   30 capsule   0   .  risperiDONE (RISPERDAL) 1 MG tablet  TAKE 1 TABLET BY MOUTH TWICE DAILY   60 tablet   2   .  TRUEPLUS LANCETS 33G MISC  USE AS DIRECTED   100 each   3       No current facility-administered medications for this visit.       Allergies as of 05/11/2012 - Review Complete  05/11/2012   Allergen  Reaction  Noted   .  Divalproex sodium    11/17/2008       reports that he quit smoking about 7 years ago. He has never used smokeless tobacco. He reports that he does not drink alcohol or use illicit drugs.         Review of Systems: Pertinent positive and negative review of systems were noted in the above HPI section. All other review of systems were otherwise negative.   Vital signs were reviewed in today's medical record Physical Exam: General: Well developed , well nourished, no acute distress Skin: anicteric Head: Normocephalic and atraumatic Eyes:  sclerae anicteric, EOMI Ears: Normal auditory acuity Mouth: No deformity or lesions Neck: Supple, no masses or thyromegaly Lungs: Clear throughout to auscultation Heart: Regular rate and rhythm; no murmurs, rubs or bruits Abdomen: Soft, non tender and non distended. No masses, hepatosplenomegaly or hernias noted. Normal Bowel sounds Rectal:deferred Musculoskeletal: Symmetrical with no gross deformities   Skin: No lesions on visible extremities Pulses:  Normal pulses noted Extremities: No clubbing, cyanosis, edema or deformities noted Neurological: Alert oriented x 4, grossly nonfocal Cervical Nodes:  No significant cervical adenopathy Inguinal Nodes: No significant inguinal adenopathy Psychological:   Alert and cooperative. Normal mood and affect                      Benign neoplasm of colon - Louis Meckel, MD at 05/12/2012 11:25 AM    Status: Linus Orn Related Problem: Benign neoplasm of colon           Removing the cecal polyp is a higher risk procedure because it is large and sessile. Higher risk for bleeding and perforation were discussed with the patient including alternative therapy including surgery. Patient wishes to proceed with  colonoscopy. Aggrenox will have to be held. I discussed the higher risk for stroke while holding Aggrenox.           Revision History

## 2012-06-15 ENCOUNTER — Encounter (HOSPITAL_COMMUNITY): Payer: Self-pay | Admitting: Gastroenterology

## 2012-06-15 ENCOUNTER — Telehealth: Payer: Self-pay | Admitting: Gastroenterology

## 2012-06-15 NOTE — Assessment & Plan Note (Signed)
Lab Results  Component Value Date   HGBA1C 6.4 06/11/2012   HGBA1C 6.3 02/20/2012   HGBA1C 6.3 08/27/2011     Assessment: Diabetes control: good control (HgbA1C at goal) Progress toward A1C goal:  at goal Comments:   Plan: Medications:  continue current medications Home glucose monitoring: Frequency:   Timing:   Instruction/counseling given: discussed diet Educational resources provided: brochure;handout Self management tools provided:   Other plans:

## 2012-06-15 NOTE — Assessment & Plan Note (Signed)
BP Readings from Last 3 Encounters:  06/12/12 158/106  06/12/12 158/106  06/11/12 138/84    Lab Results  Component Value Date   NA 144 02/20/2012   K 4.2 02/20/2012   CREATININE 1.17 02/20/2012    Assessment: Blood pressure control: mildly elevated Progress toward BP goal:  improved Comments:   Plan: Medications:  continue current medications Educational resources provided: brochure;handout;video Self management tools provided:   Other plans:

## 2012-06-15 NOTE — Telephone Encounter (Signed)
Pt has not had a BM since procedure on Friday. Instructed wife that he could take Miralax to help with constipation. She verbalized understanding.

## 2012-06-16 ENCOUNTER — Telehealth: Payer: Self-pay | Admitting: Gastroenterology

## 2012-06-16 NOTE — Telephone Encounter (Signed)
Pt had colon with Dr. Arlyce Dice 06/12/12. Wife called yesterday stating that the pt had not had a BM. Pt instructed to take Miralax. Wife states that he took 2 doses last night and one dose this am and still has not had a BM. Dr. Arlyce Dice please advise.

## 2012-06-17 ENCOUNTER — Encounter: Payer: Self-pay | Admitting: Gastroenterology

## 2012-06-17 NOTE — Telephone Encounter (Signed)
Not to worry.  Take metamucil qd

## 2012-06-17 NOTE — Telephone Encounter (Signed)
Left message for pt to call back  °

## 2012-06-17 NOTE — Telephone Encounter (Signed)
Wife aware. They may try some magnesium citrate 1/2 bottle and see if any results. If no BM they will try the rest of the bottle. Wife knows to call back if any further problems.

## 2012-06-26 ENCOUNTER — Other Ambulatory Visit: Payer: Self-pay | Admitting: Internal Medicine

## 2012-07-23 ENCOUNTER — Other Ambulatory Visit: Payer: Self-pay

## 2012-07-26 ENCOUNTER — Other Ambulatory Visit: Payer: Self-pay | Admitting: Internal Medicine

## 2012-07-29 ENCOUNTER — Ambulatory Visit (INDEPENDENT_AMBULATORY_CARE_PROVIDER_SITE_OTHER): Payer: Medicare Other | Admitting: Internal Medicine

## 2012-07-29 ENCOUNTER — Encounter: Payer: Self-pay | Admitting: Internal Medicine

## 2012-07-29 VITALS — BP 133/86 | HR 92 | Temp 97.8°F | Wt 258.2 lb

## 2012-07-29 DIAGNOSIS — I10 Essential (primary) hypertension: Secondary | ICD-10-CM

## 2012-07-29 DIAGNOSIS — IMO0001 Reserved for inherently not codable concepts without codable children: Secondary | ICD-10-CM

## 2012-07-29 DIAGNOSIS — M7918 Myalgia, other site: Secondary | ICD-10-CM

## 2012-07-29 DIAGNOSIS — E119 Type 2 diabetes mellitus without complications: Secondary | ICD-10-CM

## 2012-07-29 DIAGNOSIS — R269 Unspecified abnormalities of gait and mobility: Secondary | ICD-10-CM

## 2012-07-29 MED ORDER — ACETAMINOPHEN 325 MG PO TABS: 325.0000 mg | ORAL_TABLET | Freq: Four times a day (QID) | ORAL | Status: DC | PRN

## 2012-07-29 NOTE — Assessment & Plan Note (Signed)
Lab Results  Component Value Date   HGBA1C 6.4 06/11/2012   HGBA1C 6.3 02/20/2012   HGBA1C 6.3 08/27/2011    Assessment: Diabetes control: good control (HgbA1C at goal) Progress toward A1C goal:  at goal Comments: next check August 2014 with pcp  Plan: Medications:  continue current medications metformin 500mg  bid Home glucose monitoring: Frequency:   Timing: before meals Instruction/counseling given: reminded to get eye exam, reminded to bring blood glucose meter & log to each visit, reminded to bring medications to each visit, discussed foot care, discussed the need for weight loss and discussed diet Educational resources provided: brochure;handout

## 2012-07-29 NOTE — Assessment & Plan Note (Signed)
Reports diffuse joint pain and stiffness.  ?OA, although complains of symmetrical and all extremities but no visible signs of inflammation.  Good ROM of all extremities, walks with can.  Was prescribed flexeril in the past but unable to get filled due to insurance not covering.    -given current complaints by wife of worsening memory and confusion, will not rewrite for flexeril and defer to pcp -recommend trial of acetaminophen prn pain at this time x1 month -if pain continues may need to investigate further, consider calcium and vitamin d supplementation vs. Imaging, vs improved pain control

## 2012-07-29 NOTE — Assessment & Plan Note (Signed)
Walks with cane. Denies recent fall.  Reports diffuse joint pain.  -will try tylenol prn pain ?oa

## 2012-07-29 NOTE — Patient Instructions (Signed)
General Instructions:  Please try tylenol 325-650mg  by mouth up to 4 times a day for your stiffness and see if that helps  Follow up with your pcp next month recheck your a1c  Keep track of your sugars and bring your meter next time  Your blood pressure was good today, great job! Keep it up  Treatment Goals:  Goals (1 Years of Data) as of 07/29/12         06/11/12 02/20/12 08/27/11 01/21/11     Lifestyle    . Prevent Falls          Result Component    . HEMOGLOBIN A1C < 7.0  6.4 6.3 6.3 6.2    . LDL CALC < 70   52  63      Progress Toward Treatment Goals:  Treatment Goal 07/29/2012  Hemoglobin A1C at goal  Blood pressure at goal    Self Care Goals & Plans:  Self Care Goal 07/29/2012  Manage my medications take my medicines as prescribed; bring my medications to every visit; refill my medications on time; follow the sick day instructions if I am sick  Monitor my health keep track of my blood glucose; keep track of my blood pressure; keep track of my weight; check my feet daily  Eat healthy foods eat more vegetables; eat fruit for snacks and desserts; eat baked foods instead of fried foods; eat foods that are low in salt; eat smaller portions; drink diet soda or water instead of juice or soda  Be physically active find an activity I enjoy    Home Blood Glucose Monitoring 07/29/2012  Check my blood sugar -  When to check my blood sugar before meals    Care Management & Community Referrals:    Acetaminophen tablets or caplets What is this medicine? ACETAMINOPHEN (a set a MEE noe fen) is a pain reliever. It is used to treat mild pain and fever. This medicine may be used for other purposes; ask your health care provider or pharmacist if you have questions. What should I tell my health care provider before I take this medicine? They need to know if you have any of these conditions: -if you frequently drink alcohol containing drinks -liver disease -an unusual or allergic  reaction to acetaminophen, other medicines, foods, dyes or preservatives -pregnant or trying to get pregnant -breast-feeding How should I use this medicine? Take this medicine by mouth with a glass of water. Follow the directions on the package or prescription label. Take your medicine at regular intervals. Do not take your medicine more often than directed. Talk to your pediatrician regarding the use of this medicine in children. While this drug may be prescribed for children as young as 83 years of age for selected conditions, precautions do apply. Overdosage: If you think you have taken too much of this medicine contact a poison control center or emergency room at once. NOTE: This medicine is only for you. Do not share this medicine with others. What if I miss a dose? If you miss a dose, take it as soon as you can. If it is almost time for your next dose, take only that dose. Do not take double or extra doses. What may interact with this medicine? -alcohol -imatinib -isoniazid -other medicines with acetaminophen This list may not describe all possible interactions. Give your health care provider a list of all the medicines, herbs, non-prescription drugs, or dietary supplements you use. Also tell them if you smoke, drink alcohol, or  use illegal drugs. Some items may interact with your medicine. What should I watch for while using this medicine? Tell your doctor or health care professional if the pain lasts more than 10 days (5 days for children), if it gets worse, or if there is a new or different kind of pain. Also, check with your doctor if a fever lasts for more than 3 days. Do not take other medicines that contain acetaminophen with this medicine. Always read labels carefully. If you have questions, ask your doctor or pharmacist. If you take too much acetaminophen get medical help right away. Too much acetaminophen can be very dangerous and cause liver damage. Even if you do not have  symptoms, it is important to get help right away. What side effects may I notice from receiving this medicine? Side effects that you should report to your doctor or health care professional as soon as possible: -allergic reactions like skin rash, itching or hives, swelling of the face, lips, or tongue -breathing problems -fever or sore throat -trouble passing urine or change in the amount of urine -unusual bleeding or bruising -unusually weak or tired -yellowing of the eyes or skin Side effects that usually do not require medical attention (report to your doctor or health care professional if they continue or are bothersome): -headache -nausea, stomach upset This list may not describe all possible side effects. Call your doctor for medical advice about side effects. You may report side effects to FDA at 1-800-FDA-1088. Where should I keep my medicine? Keep out of reach of children. Store at room temperature between 20 and 25 degrees C (68 and 77 degrees F). Protect from moisture and heat. Throw away any unused medicine after the expiration date. NOTE: This sheet is a summary. It may not cover all possible information. If you have questions about this medicine, talk to your doctor, pharmacist, or health care provider.  2012, [employer] Standard. (12/07/2008 10:38:02 AM)

## 2012-07-29 NOTE — Assessment & Plan Note (Signed)
BP Readings from Last 3 Encounters:  07/29/12 133/86  06/12/12 158/106  06/12/12 158/106   Lab Results  Component Value Date   NA 144 02/20/2012   K 4.2 02/20/2012   CREATININE 1.17 02/20/2012   Assessment: Blood pressure control: controlled Progress toward BP goal:  at goal  Plan: Medications:  continue current medications norvasc 10mg  and losartan 100mg  Educational resources provided: handout;video
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
## 2012-07-31 NOTE — Progress Notes (Signed)
Case discussed with Dr. Qureshi at the time of the visit.  We reviewed the resident's history and exam and pertinent patient test results.  I agree with the assessment, diagnosis, and plan of care documented in the resident's note. 

## 2012-08-08 ENCOUNTER — Other Ambulatory Visit: Payer: Self-pay | Admitting: Internal Medicine

## 2012-08-08 DIAGNOSIS — E785 Hyperlipidemia, unspecified: Secondary | ICD-10-CM

## 2012-08-11 MED ORDER — ATORVASTATIN CALCIUM 40 MG PO TABS: 40.0000 mg | ORAL_TABLET | Freq: Every day | ORAL | Status: DC

## 2012-08-19 ENCOUNTER — Encounter: Payer: Self-pay | Admitting: Internal Medicine

## 2012-08-19 ENCOUNTER — Ambulatory Visit (INDEPENDENT_AMBULATORY_CARE_PROVIDER_SITE_OTHER): Payer: Medicare Other | Admitting: Internal Medicine

## 2012-08-19 VITALS — BP 119/81 | HR 101 | Temp 96.7°F | Wt 255.5 lb

## 2012-08-19 DIAGNOSIS — F329 Major depressive disorder, single episode, unspecified: Secondary | ICD-10-CM

## 2012-08-19 DIAGNOSIS — IMO0001 Reserved for inherently not codable concepts without codable children: Secondary | ICD-10-CM

## 2012-08-19 DIAGNOSIS — M7918 Myalgia, other site: Secondary | ICD-10-CM

## 2012-08-19 DIAGNOSIS — D126 Benign neoplasm of colon, unspecified: Secondary | ICD-10-CM

## 2012-08-19 DIAGNOSIS — E119 Type 2 diabetes mellitus without complications: Secondary | ICD-10-CM

## 2012-08-19 DIAGNOSIS — I10 Essential (primary) hypertension: Secondary | ICD-10-CM

## 2012-08-19 DIAGNOSIS — D649 Anemia, unspecified: Secondary | ICD-10-CM

## 2012-08-19 DIAGNOSIS — F0280 Dementia in other diseases classified elsewhere without behavioral disturbance: Secondary | ICD-10-CM

## 2012-08-19 DIAGNOSIS — Z86010 Personal history of colonic polyps: Secondary | ICD-10-CM

## 2012-08-19 LAB — CBC
Platelets: 242 10*3/uL (ref 150–400)
RDW: 17.3 % — ABNORMAL HIGH (ref 11.5–15.5)
WBC: 8.1 10*3/uL (ref 4.0–10.5)

## 2012-08-19 LAB — COMPREHENSIVE METABOLIC PANEL
ALT: 15 U/L (ref 0–53)
AST: 17 U/L (ref 0–37)
Albumin: 4.2 g/dL (ref 3.5–5.2)
Calcium: 9.6 mg/dL (ref 8.4–10.5)
Chloride: 105 mEq/L (ref 96–112)
Creat: 1.49 mg/dL — ABNORMAL HIGH (ref 0.50–1.35)
Potassium: 4.2 mEq/L (ref 3.5–5.3)

## 2012-08-19 LAB — TSH: TSH: 1.16 u[IU]/mL (ref 0.350–4.500)

## 2012-08-19 MED ORDER — GLUCOSE BLOOD VI STRP: ORAL_STRIP | Status: DC

## 2012-08-19 MED ORDER — TRUEPLUS LANCETS 33G MISC: 1.0000 | Freq: Every day | Status: DC | PRN

## 2012-08-19 NOTE — Patient Instructions (Addendum)
General Instructions: -Try Bengay & tylenol for your muscular pains.  You can also try heat/cold compresses, but do not apply head when you use bengay.    -Continue taking your medications as you have been  -Today, I am checking your blood count, thyroid function, kidney function, and liver function.  -Please contact the front desk regarding your bills, you may need to actually bring them in  Please be sure to bring all of your medications with you to every visit.  Should you have any new or worsening symptoms, please be sure to call the clinic at [phone].  Treatment Goals:  Goals (1 Years of Data) as of 08/19/12         06/11/12 02/20/12 08/27/11 01/21/11     Lifestyle    . Prevent Falls          Result Component    . HEMOGLOBIN A1C < 7.0  6.4 6.3 6.3 6.2    . LDL CALC < 70   52  63      Progress Toward Treatment Goals:  Treatment Goal 08/19/2012  Hemoglobin A1C -  Blood pressure at goal    Self Care Goals & Plans:  Self Care Goal 08/19/2012  Manage my medications take my medicines as prescribed; bring my medications to every visit; refill my medications on time  Monitor my health keep track of my blood glucose; bring my glucose meter and log to each visit; check my feet daily  Eat healthy foods eat foods that are low in salt; eat baked foods instead of fried foods  Be physically active park at the far end of the parking lot    Home Blood Glucose Monitoring 08/19/2012  Check my blood sugar once a day  When to check my blood sugar -     Care Management & Community Referrals:  Referral 08/19/2012  Referrals made for care management support none needed

## 2012-08-19 NOTE — Assessment & Plan Note (Signed)
BP Readings from Last 3 Encounters:  08/19/12 119/81  07/29/12 133/86  06/12/12 158/106    Lab Results  Component Value Date   NA 144 02/20/2012   K 4.2 02/20/2012   CREATININE 1.17 02/20/2012    Assessment: Blood pressure control: controlled Progress toward BP goal:  at goal  Plan: Medications:  continue current medications - norvasc 10, losartan 100 Educational resources provided: brochure Self management tools provided: home blood pressure logbook

## 2012-08-19 NOTE — Assessment & Plan Note (Signed)
Lab Results  Component Value Date   HGBA1C 6.4 06/11/2012   HGBA1C 6.3 02/20/2012   HGBA1C 6.3 08/27/2011     Assessment: Diabetes control: good control (HgbA1C at goal) Progress toward A1C goal:   at goal  Plan: Medications:  continue current medications - metformin 500 BID Home glucose monitoring: Frequency: once a day Timing:   Instruction/counseling given: reminded to get eye exam - appt made today Educational resources provided: brochure

## 2012-08-19 NOTE — Assessment & Plan Note (Signed)
Abnormal minicog (could not recall 3/3 items and abn clock); TSH today. Vit B12 wnl recently.  MoCA at next visit.

## 2012-08-19 NOTE — Assessment & Plan Note (Signed)
Tubulovillous and tubular adenoma (polypectomy 05/2012) --> plan for repeat colonoscopy in 1 year with Dr. Arlyce Dice.

## 2012-08-19 NOTE — Assessment & Plan Note (Addendum)
Muscular tension present on exam. Has still not tried to use tylenol.  Have suggested conservative mgmt with tylenol, bengay and heat/cold compresses (avoid heat after bengay/icyhot) given h/o dementia.
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
## 2012-08-20 LAB — MICROALBUMIN / CREATININE URINE RATIO
Creatinine, Urine: 234.9 mg/dL
Microalb Creat Ratio: 45.5 mg/g — ABNORMAL HIGH (ref 0.0–30.0)
Microalb, Ur: 10.68 mg/dL — ABNORMAL HIGH (ref 0.00–1.89)

## 2012-09-24 ENCOUNTER — Other Ambulatory Visit: Payer: Self-pay | Admitting: Internal Medicine

## 2012-10-17 ENCOUNTER — Other Ambulatory Visit: Payer: Self-pay | Admitting: Internal Medicine

## 2012-11-16 ENCOUNTER — Other Ambulatory Visit: Payer: Self-pay | Admitting: Internal Medicine

## 2012-11-30 ENCOUNTER — Ambulatory Visit: Payer: Self-pay | Admitting: Podiatry

## 2012-12-03 ENCOUNTER — Encounter: Payer: Self-pay | Admitting: Internal Medicine

## 2012-12-03 ENCOUNTER — Ambulatory Visit (INDEPENDENT_AMBULATORY_CARE_PROVIDER_SITE_OTHER): Payer: Medicare Other | Admitting: Internal Medicine

## 2012-12-03 ENCOUNTER — Ambulatory Visit (HOSPITAL_COMMUNITY)
Admission: RE | Admit: 2012-12-03 | Discharge: 2012-12-03 | Disposition: A | Discharge status: Home / Self Care | Payer: Medicare Other | Source: Ambulatory Visit | Attending: Internal Medicine | Admitting: Internal Medicine

## 2012-12-03 VITALS — BP 151/92 | HR 98 | Temp 96.6°F | Ht 66.0 in | Wt 255.2 lb

## 2012-12-03 DIAGNOSIS — M7918 Myalgia, other site: Secondary | ICD-10-CM

## 2012-12-03 DIAGNOSIS — F0280 Dementia in other diseases classified elsewhere without behavioral disturbance: Secondary | ICD-10-CM

## 2012-12-03 DIAGNOSIS — M549 Dorsalgia, unspecified: Secondary | ICD-10-CM

## 2012-12-03 DIAGNOSIS — M7730 Calcaneal spur, unspecified foot: Secondary | ICD-10-CM | POA: Insufficient documentation

## 2012-12-03 DIAGNOSIS — M542 Cervicalgia: Secondary | ICD-10-CM | POA: Insufficient documentation

## 2012-12-03 DIAGNOSIS — E119 Type 2 diabetes mellitus without complications: Secondary | ICD-10-CM

## 2012-12-03 NOTE — Patient Instructions (Addendum)
We will refer you to Sports Medicine for your feet and back pain. You will need Xrays of your back. This can be done today. Follow-up with Dr. Everardo Beals for further neurologic evaluation in December.  Heel Spur A heel spur is a hook of bone that can form on the calcaneus (the heel bone and the largest bone of the foot). Heel spurs are often associated with plantar fasciitis and usually come in people who have had the problem for an extended period of time. The cause of the relationship is unknown. The pain associated with them is thought to be caused by an inflammation (soreness and redness) of the plantar fascia rather than the spur itself. The plantar fascia is a thick fibrous like tissue that runs from the calcaneus (heel bone) to the ball of the foot. This strong, tight tissue helps maintain the arch of your foot. It helps distribute the weight across your foot as you walk or run. Stresses placed on the plantar fascia can be tremendous. When it is inflamed normal activities become painful. Pain is worse in the morning after sleeping. After sleeping the plantar fascia is tight. The first movements stretch the fascia and this causes pain. As the tendon loosens, the pain usually gets better. It often returns with too much standing or walking.  About 70% of patients with plantar fasciitis have a heel spur. About half of people without foot pain also have heel spurs. DIAGNOSIS  The diagnosis of a heel spur is made by X-ray. The X-ray shows a hook of bone protruding from the bottom of the calcaneus at the point where the plantar fascia is attached to the heel bone.  TREATMENT  It is necessary to find out what is causing the stretching of the plantar fascia. If the cause is over-pronation (flat feet), orthotics and proper foot ware may help.  Stretching exercises, losing weight, wearing shoes that have a cushioned heel that absorbs shock, and elevating the heel with the use of a heel cradle, heel cup, or  orthotics may all help. Heel cradles and heel cups provide extra comfort and cushion to the heel, and reduce the amount of shock to the sore area. AVOIDING THE PAIN OF PLANTAR FASCIITIS AND HEEL SPURS  Consult a sports medicine professional before beginning a new exercise program.  Walking programs offer a good workout. There is a lower chance of overuse injuries common to the runners. There is less impact and less jarring of the joints.  Begin all new exercise programs slowly. If problems or pains develop, decrease the amount of time or distance until you are at a comfortable level.  Wear good shoes and replace them regularly.  Stretch your foot and the heel cords at the back of the ankle (Achilles tendons) both before and after exercise.  Run or exercise on even surfaces that are not hard. For example, asphalt is better than pavement.  Do not run barefoot on hard surfaces.  If using a treadmill, vary the incline.  Do not continue to workout if you have foot or joint problems. Seek professional help if they do not improve. HOME CARE INSTRUCTIONS   Avoid activities that cause you pain until you recover.  Use ice or cold packs to the problem or painful areas after working out.  Only take over-the-counter or prescription medicines for pain, discomfort, or fever as directed by your caregiver.  Soft shoe inserts or athletic shoes with air or gel sole cushions may be helpful.  If problems  continue or become more severe, consult a sports medicine caregiver. Cortisone is a potent anti-inflammatory medication that may be injected into the painful area. You can discuss this treatment with your caregiver. MAKE SURE YOU:   Understand these instructions.  Will watch your condition.  Will get help right away if you are not doing well or get worse. Document Released: 02/06/2005 Document Revised: 03/25/2011 Document Reviewed: 04/10/2005 [facility] Patient Information 2014 [place], [place].
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
## 2012-12-05 NOTE — Progress Notes (Addendum)
Case discussed with Dr. Schooler soon after the resident saw the patient.  We reviewed the resident's history and exam and pertinent patient test results.  I agree with the assessment, diagnosis and plan of care documented in the resident's note. 

## 2012-12-15 ENCOUNTER — Ambulatory Visit (INDEPENDENT_AMBULATORY_CARE_PROVIDER_SITE_OTHER): Payer: Medicare Other | Admitting: Sports Medicine

## 2012-12-15 ENCOUNTER — Encounter: Payer: Self-pay | Admitting: Sports Medicine

## 2012-12-15 ENCOUNTER — Encounter (INDEPENDENT_AMBULATORY_CARE_PROVIDER_SITE_OTHER): Payer: Self-pay

## 2012-12-15 VITALS — BP 147/91 | HR 86 | Ht 66.0 in | Wt 255.0 lb

## 2012-12-15 DIAGNOSIS — M79671 Pain in right foot: Secondary | ICD-10-CM

## 2012-12-15 DIAGNOSIS — M79609 Pain in unspecified limb: Secondary | ICD-10-CM

## 2012-12-15 NOTE — Assessment & Plan Note (Signed)
Calcaneal spurs found on Xrays are asymptomatic and clinically there is no evidence of plantar fascitis. Most likely pain is from arthritic changes of 1st metatarsophalangeal joint also found on xrays. We recommend inserts that will be crafted in our clinic and hopefully this can improve his symptoms.
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
## 2012-12-26 ENCOUNTER — Other Ambulatory Visit: Payer: Self-pay | Admitting: Internal Medicine

## 2013-01-18 ENCOUNTER — Encounter (HOSPITAL_COMMUNITY): Payer: Self-pay | Admitting: Emergency Medicine

## 2013-01-18 ENCOUNTER — Emergency Department (HOSPITAL_COMMUNITY): Payer: Medicare Other

## 2013-01-18 DIAGNOSIS — F3289 Other specified depressive episodes: Secondary | ICD-10-CM | POA: Diagnosis not present

## 2013-01-18 DIAGNOSIS — Y9301 Activity, walking, marching and hiking: Secondary | ICD-10-CM | POA: Diagnosis not present

## 2013-01-18 DIAGNOSIS — Z8673 Personal history of transient ischemic attack (TIA), and cerebral infarction without residual deficits: Secondary | ICD-10-CM | POA: Insufficient documentation

## 2013-01-18 DIAGNOSIS — K219 Gastro-esophageal reflux disease without esophagitis: Secondary | ICD-10-CM | POA: Diagnosis not present

## 2013-01-18 DIAGNOSIS — S71009A Unspecified open wound, unspecified hip, initial encounter: Secondary | ICD-10-CM | POA: Diagnosis not present

## 2013-01-18 DIAGNOSIS — F329 Major depressive disorder, single episode, unspecified: Secondary | ICD-10-CM | POA: Diagnosis not present

## 2013-01-18 DIAGNOSIS — Z87891 Personal history of nicotine dependence: Secondary | ICD-10-CM | POA: Insufficient documentation

## 2013-01-18 DIAGNOSIS — I129 Hypertensive chronic kidney disease with stage 1 through stage 4 chronic kidney disease, or unspecified chronic kidney disease: Secondary | ICD-10-CM | POA: Insufficient documentation

## 2013-01-18 DIAGNOSIS — N289 Disorder of kidney and ureter, unspecified: Secondary | ICD-10-CM | POA: Diagnosis not present

## 2013-01-18 DIAGNOSIS — Z860100 Personal history of colon polyps, unspecified: Secondary | ICD-10-CM | POA: Insufficient documentation

## 2013-01-18 DIAGNOSIS — W268XXA Contact with other sharp object(s), not elsewhere classified, initial encounter: Secondary | ICD-10-CM | POA: Insufficient documentation

## 2013-01-18 DIAGNOSIS — E119 Type 2 diabetes mellitus without complications: Secondary | ICD-10-CM | POA: Diagnosis not present

## 2013-01-18 DIAGNOSIS — E785 Hyperlipidemia, unspecified: Secondary | ICD-10-CM | POA: Insufficient documentation

## 2013-01-18 DIAGNOSIS — F0280 Dementia in other diseases classified elsewhere without behavioral disturbance: Secondary | ICD-10-CM | POA: Insufficient documentation

## 2013-01-18 DIAGNOSIS — Z79899 Other long term (current) drug therapy: Secondary | ICD-10-CM | POA: Diagnosis not present

## 2013-01-18 DIAGNOSIS — Z888 Allergy status to other drugs, medicaments and biological substances status: Secondary | ICD-10-CM | POA: Diagnosis not present

## 2013-01-18 DIAGNOSIS — S79919A Unspecified injury of unspecified hip, initial encounter: Secondary | ICD-10-CM | POA: Diagnosis present

## 2013-01-18 DIAGNOSIS — Y929 Unspecified place or not applicable: Secondary | ICD-10-CM | POA: Insufficient documentation

## 2013-01-18 DIAGNOSIS — S71109A Unspecified open wound, unspecified thigh, initial encounter: Principal | ICD-10-CM | POA: Insufficient documentation

## 2013-01-18 DIAGNOSIS — Z23 Encounter for immunization: Secondary | ICD-10-CM | POA: Insufficient documentation

## 2013-01-18 DIAGNOSIS — Y92009 Unspecified place in unspecified non-institutional (private) residence as the place of occurrence of the external cause: Secondary | ICD-10-CM | POA: Insufficient documentation

## 2013-01-18 DIAGNOSIS — D649 Anemia, unspecified: Secondary | ICD-10-CM | POA: Insufficient documentation

## 2013-01-18 DIAGNOSIS — G3109 Other frontotemporal dementia: Secondary | ICD-10-CM

## 2013-01-18 LAB — COMPREHENSIVE METABOLIC PANEL
ALT: 16 U/L (ref 0–53)
AST: 35 U/L (ref 0–37)
Albumin: 3.8 g/dL (ref 3.5–5.2)
Alkaline Phosphatase: 75 U/L (ref 39–117)
BUN: 17 mg/dL (ref 6–23)
CHLORIDE: 104 meq/L (ref 96–112)
CO2: 28 meq/L (ref 19–32)
Calcium: 9.2 mg/dL (ref 8.4–10.5)
Creatinine, Ser: 1.3 mg/dL (ref 0.50–1.35)
GFR calc Af Amer: 67 mL/min — ABNORMAL LOW (ref >90)
GFR, EST NON AFRICAN AMERICAN: 58 mL/min — AB (ref >90)
Glucose, Bld: 105 mg/dL — ABNORMAL HIGH (ref 70–99)
Potassium: 5.3 mEq/L (ref 3.7–5.3)
SODIUM: 144 meq/L (ref 137–147)
Total Protein: 8.1 g/dL (ref 6.0–8.3)

## 2013-01-18 LAB — CBC WITH DIFFERENTIAL/PLATELET
BASOS ABS: 0 10*3/uL (ref 0.0–0.1)
Basophils Relative: 0 % (ref 0–1)
Eosinophils Absolute: 0.1 10*3/uL (ref 0.0–0.7)
Eosinophils Relative: 1 % (ref 0–5)
HEMATOCRIT: 35.2 % — AB (ref 39.0–52.0)
Hemoglobin: 11.3 g/dL — ABNORMAL LOW (ref 13.0–17.0)
Lymphocytes Relative: 39 % (ref 12–46)
Lymphs Abs: 2.7 10*3/uL (ref 0.7–4.0)
MCH: 25.8 pg — ABNORMAL LOW (ref 26.0–34.0)
MCHC: 32.1 g/dL (ref 30.0–36.0)
MCV: 80.4 fL (ref 78.0–100.0)
Monocytes Absolute: 0.6 10*3/uL (ref 0.1–1.0)
Monocytes Relative: 9 % (ref 3–12)
NEUTROS ABS: 3.6 10*3/uL (ref 1.7–7.7)
Neutrophils Relative %: 52 % (ref 43–77)
PLATELETS: 212 10*3/uL (ref 150–400)
RBC: 4.38 MIL/uL (ref 4.22–5.81)
RDW: 15.6 % — AB (ref 11.5–15.5)
WBC: 6.9 10*3/uL (ref 4.0–10.5)

## 2013-01-18 LAB — GLUCOSE, CAPILLARY: Glucose-Capillary: 98 mg/dL (ref 70–99)

## 2013-01-18 NOTE — ED Notes (Signed)
Patient transported to X-ray 

## 2013-01-18 NOTE — ED Notes (Addendum)
Pt. tripped and fell at home this morning , no LOC / ambulatory , presents with laceration approx. 2 inches at left hip , no bleeding , alert and oriented / respirations unlabored / denies pain . Sterile gauze dressing applied at triage.

## 2013-01-19 ENCOUNTER — Emergency Department (HOSPITAL_COMMUNITY)
Admission: EM | Admit: 2013-01-19 | Discharge: 2013-01-19 | Disposition: A | Discharge status: Home / Self Care | Payer: Medicare Other | Attending: Emergency Medicine | Admitting: Emergency Medicine

## 2013-01-19 DIAGNOSIS — W19XXXA Unspecified fall, initial encounter: Secondary | ICD-10-CM

## 2013-01-19 DIAGNOSIS — S71009A Unspecified open wound, unspecified hip, initial encounter: Secondary | ICD-10-CM | POA: Diagnosis not present

## 2013-01-19 DIAGNOSIS — IMO0002 Reserved for concepts with insufficient information to code with codable children: Secondary | ICD-10-CM

## 2013-01-19 DIAGNOSIS — Y92009 Unspecified place in unspecified non-institutional (private) residence as the place of occurrence of the external cause: Secondary | ICD-10-CM

## 2013-01-19 MED ORDER — TETANUS-DIPHTH-ACELL PERTUSSIS 5-2.5-18.5 LF-MCG/0.5 IM SUSP
0.5000 mL | Freq: Once | INTRAMUSCULAR | Status: AC
  Administered 2013-01-19: 0.5 mL via INTRAMUSCULAR
  Filled 2013-01-19: qty 0.5

## 2013-01-19 MED ORDER — LIDOCAINE-EPINEPHRINE 2 %-1:100000 IJ SOLN
20.0000 mL | Freq: Once | INTRAMUSCULAR | Status: DC
  Filled 2013-01-19: qty 20

## 2013-01-19 NOTE — ED Provider Notes (Signed)
CSN: [id number]     Arrival date & time 01/18/13  2030 History   First MD Initiated Contact with Patient 01/19/13 0110     Chief Complaint  Patient presents with  . Fall  . Laceration   (Consider location/radiation/quality/duration/timing/severity/associated sxs/prior Treatment) HPI  This a 63 year old male with a history of stroke, diabetes, hypertension who presents following a fall. History is obtained from the patient and his wife. They report mechanical fall yesterday morning at 8 AM. Patient at baseline walks with a walker. He got up from bed quickly and tripped or his feet. He did not hit his head, there was no loss of consciousness. Patient has decreased sensation over the entire right side of his body and did not note any injury that time. However, he did fall into a vase which broke on impact.  He has been angulatory sent. When the wife was attempting to sponge bathe him later yesterday evening, she noted a laceration to the left thigh. He denies any other pain or injury this time.  Past Medical History  Diagnosis Date  . Stroke     3 strokes (2004, 2006, 2007)  . Diabetes mellitus   . Depression   . Hypertension   . ANEMIA, NORMOCYTIC, CHRONIC 11/17/2007    Qualifier: Diagnosis of  By: Cruzita Lederer MD, Salena Saner    . Dementia of frontal lobe type 12/17/2010  . HYPERLIPIDEMIA 04/24/2006    Qualifier: Diagnosis of  By: Cruzita Lederer MD, Salena Saner    . RENAL INSUFFICIENCY, CHRONIC 04/25/2006    Qualifier: Diagnosis of  By: Cruzita Lederer MD, Salena Saner    . GERD (gastroesophageal reflux disease)   . Colon polyp 06/12/12    Colonoscopy and polypectomy by Dr. Deatra Ina revealed tubular adenoma and tubulovillous adenoma   Past Surgical History  Procedure Laterality Date  . Colonoscopy    . Colonoscopy N/A 06/12/2012    Procedure: COLONOSCOPY;  Surgeon: Inda Castle, MD;  Location: [facility];  Service: Endoscopy;  Laterality: N/A;  . Hot hemostasis N/A 06/12/2012    Procedure: HOT HEMOSTASIS (ARGON  PLASMA COAGULATION/BICAP);  Surgeon: Inda Castle, MD;  Location: [facility];  Service: Endoscopy;  Laterality: N/A;   Family History  Problem Relation Age of Onset  . Diabetes Maternal Grandmother   . Colon cancer Neg Hx   . Stroke Mother    History  Substance Use Topics  . Smoking status: Former Smoker    Quit date: 04/29/2005  . Smokeless tobacco: Never Used  . Alcohol Use: No    Review of Systems  Constitutional: Negative.  Negative for fever.  Respiratory: Negative.  Negative for chest tightness and shortness of breath.   Cardiovascular: Negative.  Negative for chest pain.  Gastrointestinal: Negative.  Negative for abdominal pain.  Genitourinary: Negative.  Negative for dysuria.  Musculoskeletal: Negative for back pain and neck pain.  Skin: Positive for wound. Negative for color change.  Neurological: Negative for headaches.  All other systems reviewed and are negative.    Allergies  Divalproex sodium  Home Medications   Current Outpatient Rx  Name  Route  Sig  Dispense  Refill  . amitriptyline (ELAVIL) 50 MG tablet   Oral   Take 50 mg by mouth at bedtime.         Marland Kitchen amLODipine (NORVASC) 10 MG tablet   Oral   Take 10 mg by mouth daily.         Marland Kitchen atorvastatin (LIPITOR) 40 MG tablet   Oral   Take 1  tablet (40 mg total) by mouth daily.   90 tablet   2   . dipyridamole-aspirin (AGGRENOX) 200-25 MG per 12 hr capsule   Oral   Take 1 capsule by mouth 2 (two) times daily.   60 capsule   11   . guaiFENesin (MUCINEX) 600 MG 12 hr tablet   Oral   Take 1,200 mg by mouth 2 (two) times daily.         Marland Kitchen losartan (COZAAR) 100 MG tablet   Oral   Take 100 mg by mouth daily.         . metFORMIN (GLUCOPHAGE) 500 MG tablet   Oral   Take 1 tablet (500 mg total) by mouth 2 (two) times daily with a meal.   180 tablet   3   . omeprazole (PRILOSEC) 20 MG capsule   Oral   Take 20 mg by mouth daily.         . risperiDONE (RISPERDAL) 1 MG tablet    Oral   Take 1 mg by mouth 2 (two) times daily.          BP 128/78  Pulse 70  Temp(Src) 98.7 F (37.1 C) (Oral)  Resp 18  SpO2 95% Physical Exam  Nursing note and vitals reviewed. Constitutional: He is oriented to person, place, and time.  No acute distress  HENT:  Head: Normocephalic and atraumatic.  Mouth/Throat: Oropharynx is clear and moist.  Eyes: Pupils are equal, round, and reactive to light.  Neck: Neck supple.  No midline C-spine tenderness  Cardiovascular: Normal rate, regular rhythm and normal heart sounds.   No murmur heard. Pulmonary/Chest: Effort normal and breath sounds normal. No respiratory distress.  Abdominal: Soft. There is no tenderness.  Musculoskeletal: Normal range of motion. He exhibits no edema.  Normal range of motion of bilateral hips and bilateral knees  Lymphadenopathy:    He has no cervical adenopathy.  Neurological: He is alert and oriented to person, place, and time. No cranial nerve deficit.  Decreased sensation over the left upper and lower extremity,  Skin: Skin is warm and dry.  6 cm gaping laceration over the left thigh, hemostatic  Psychiatric: He has a normal mood and affect.    ED Course  Procedures (including critical care time) Labs Review Labs Reviewed  CBC WITH DIFFERENTIAL - Abnormal; Notable for the following:    Hemoglobin 11.3 (*)    HCT 35.2 (*)    MCH 25.8 (*)    RDW 15.6 (*)    All other components within normal limits  COMPREHENSIVE METABOLIC PANEL - Abnormal; Notable for the following:    Glucose, Bld 105 (*)    Total Bilirubin <0.2 (*)    GFR calc non Af Amer 58 (*)    GFR calc Af Amer 67 (*)    All other components within normal limits  GLUCOSE, CAPILLARY   Imaging Review Dg Hip Complete Left  01/18/2013   CLINICAL DATA:  Pain post trauma  EXAM: LEFT HIP - COMPLETE 2+ VIEW  COMPARISON:  None.  FINDINGS: Frontal pelvis as well as frontal and lateral left hip images were obtained. There is no fracture or  dislocation. There is slight symmetric narrowing of both hip joints. No erosive change.  IMPRESSION: Slight symmetric narrowing of both hip joints. No fracture or dislocation.   Electronically Signed   By: Lowella Grip M.D.   On: 01/18/2013 21:12    EKG Interpretation   None      LACERATION  REPAIR Performed by: Merryl Hacker Authorized by: Thayer Jew, F Consent: Verbal consent obtained. Risks and benefits: risks, benefits and alternatives were discussed Consent given by: patient Patient identity confirmed: provided demographic data Prepped and Draped in normal sterile fashion Wound explored  Laceration Location: left thigh  Laceration Length: 6cm  No Foreign Bodies seen or palpated  Anesthesia: local infiltration  Local anesthetic: None, patient is insensate   Anesthetic total:  ml  Irrigation method: syringe Amount of cleaning: standard  Skin closure: staple   Number of sutures: 3  Technique: Loose approximation with a interrupted skin staple   Patient tolerance: Patient tolerated the procedure well with no immediate complications.  MDM  No diagnosis found.  Fall Laceration  Patient presents with laceration to the left hip following a fall this morning. Unknown last tetanus shot. This will be updated. He has no other evidence of injury. He is normocephalic and atraumatic. He has been ambulatory and x-rays of the bilateral hips are negative. Patient is 16 hours out from injury. Given gaping wound, will loosely approximated with staples. Discussed with wife precautions regarding infections. She was given strict return precautions. She is to followup with primary care physician in 7-10 days for staple removal.  After history, exam, and medical workup I feel the patient has been appropriately medically screened and is safe for discharge home. Pertinent diagnoses were discussed with the patient. Patient was given return precautions.     Merryl Hacker, MD 01/19/13 [phone]

## 2013-01-19 NOTE — ED Notes (Signed)
Dr. Dina Rich applied three staples to patients laceration in left hip. This nurse applied dressing to area.

## 2013-01-19 NOTE — Discharge Instructions (Signed)

## 2013-01-25 ENCOUNTER — Telehealth: Payer: Self-pay | Admitting: *Deleted

## 2013-01-25 NOTE — Telephone Encounter (Signed)
Talked to wife - fell 01/22/13 and went to ER for staples in head. Has appt 02/02/13 - wants pt to be seen sooner. Denies any redness to staple area. Offered appt next few days - decided to wait and keep appt 02/02/13  - will call clinic if any change. Hilda Blades Jabrea Kallstrom RN 01/25/13 9:15AM

## 2013-01-27 ENCOUNTER — Other Ambulatory Visit: Payer: Self-pay | Admitting: Internal Medicine

## 2013-02-02 ENCOUNTER — Ambulatory Visit (INDEPENDENT_AMBULATORY_CARE_PROVIDER_SITE_OTHER): Payer: Medicare Other | Admitting: Internal Medicine

## 2013-02-02 ENCOUNTER — Encounter: Payer: Self-pay | Admitting: Internal Medicine

## 2013-02-02 VITALS — BP 131/79 | HR 100 | Temp 97.3°F | Wt 252.0 lb

## 2013-02-02 DIAGNOSIS — F0280 Dementia in other diseases classified elsewhere without behavioral disturbance: Secondary | ICD-10-CM

## 2013-02-02 DIAGNOSIS — G3109 Other frontotemporal dementia: Secondary | ICD-10-CM

## 2013-02-02 DIAGNOSIS — W19XXXA Unspecified fall, initial encounter: Secondary | ICD-10-CM | POA: Insufficient documentation

## 2013-02-02 NOTE — Assessment & Plan Note (Signed)
Patient denies pain in legs, so less likely mechanical fall due to arthritis.  He seems to have an inconsistent sensation neuro exam, so likely has a component of peripheral neuropathy.  No s/s of dizziness/lightheadedness. No problems with other senses - vision/hearing.   -PT

## 2013-02-02 NOTE — Assessment & Plan Note (Addendum)
This seems to be progressing and causing significant caregiver stress as well. Patient to return on 02/12/13 for MoCA. TSH and B12 have been wnl.  He likely has a component of vascular dementia as well given CVA in 2008, and would not be a good candidate for antichol inhibitor therapy. GOC at follow up

## 2013-02-02 NOTE — Patient Instructions (Signed)
-  Today we removed your staples, come back to see me on January 30th, and we will decide how to manage the further decline in memory  Please be sure to bring all of your medications with you to every visit.  Should you have any new or worsening symptoms, please be sure to call the clinic at [phone].
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
## 2013-02-07 NOTE — Progress Notes (Signed)
Case discussed with Dr. Sharda soon after the resident saw the patient.  We reviewed the resident's history and exam and pertinent patient test results.  I agree with the assessment, diagnosis, and plan of care documented in the resident's note. 

## 2013-02-12 ENCOUNTER — Encounter: Payer: Self-pay | Admitting: Internal Medicine

## 2013-02-12 ENCOUNTER — Encounter: Payer: Self-pay | Admitting: Licensed Clinical Social Worker

## 2013-02-12 ENCOUNTER — Ambulatory Visit: Payer: Medicare Other | Admitting: Internal Medicine

## 2013-02-12 ENCOUNTER — Ambulatory Visit (INDEPENDENT_AMBULATORY_CARE_PROVIDER_SITE_OTHER): Payer: Medicare Other | Admitting: Internal Medicine

## 2013-02-12 VITALS — BP 152/87 | HR 103 | Temp 98.4°F | Ht 61.0 in | Wt 256.3 lb

## 2013-02-12 DIAGNOSIS — E785 Hyperlipidemia, unspecified: Secondary | ICD-10-CM

## 2013-02-12 DIAGNOSIS — K219 Gastro-esophageal reflux disease without esophagitis: Secondary | ICD-10-CM

## 2013-02-12 DIAGNOSIS — W19XXXA Unspecified fall, initial encounter: Secondary | ICD-10-CM

## 2013-02-12 DIAGNOSIS — F0280 Dementia in other diseases classified elsewhere without behavioral disturbance: Secondary | ICD-10-CM

## 2013-02-12 DIAGNOSIS — G3109 Other frontotemporal dementia: Secondary | ICD-10-CM

## 2013-02-12 DIAGNOSIS — I10 Essential (primary) hypertension: Secondary | ICD-10-CM

## 2013-02-12 MED ORDER — OMEPRAZOLE 20 MG PO CPDR: 20.0000 mg | DELAYED_RELEASE_CAPSULE | Freq: Two times a day (BID) | ORAL | Status: DC

## 2013-02-12 NOTE — Assessment & Plan Note (Addendum)
Interestingly, MOCA score today was 15/30 suggesting mod cognitive impairment (performed particularly poorly on attention, but did surprisingly well on delayed recall (3/5)).  His clock was in line with the last clock he drew, both of which were surprisingly better than the clock he drew for Dr. Obie Dredge in 2012 (see media section).  I suspect vascular dementia to be the culprit given his h/o CVA in 2008.    To note, patient does not seem to have problems with eye/ears. No urinary incontinence as long as he is prompted to urinate q3h.  No bowel incontinence, seems to have BMs every few days.  Feeds self, but cooking, bathing, dressing, finances, meds and shopping all handled by wife.  PACE has been suggested in the past, but patient declines, cost is partly contributing.   Regarding GOC, patient and wife agree that he is full code for now.
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
## 2013-02-12 NOTE — Progress Notes (Signed)
Subjective:   Patient ID: [name] [name] male   DOB: 1950-07-18 63 y.o.   MRN: [id number]  Chief Complaint  Patient presents with  . Follow-up    Doing well.  . Medication Refill    HPI: [name] [name] is a 63 y.o. man with history of DM, HTN, depression and dementia who returns today for cognitive assessment and GOC.  I most recently saw patient on 1/62/15 for removal of stitches after a mechanical fall.   Only thing the wife notes today - asks if we can send in a prescription strength guaifenesin given chronic chest congestion, as this medication OTC is expensive.     Review of Systems: Constitutional: Denies fever, chills, diaphoresis, appetite change and fatigue.  HEENT: Denies photophobia, eye pain, redness, hearing loss, ear pain, sore throat, rhinorrhea, sneezing, mouth sores, trouble swallowing, neck pain, neck stiffness and tinnitus.  Respiratory: Denies chest tightness and wheezing.  Cardiovascular: Denies chest pain, palpitations and leg swelling.  Gastrointestinal: Denies nausea, vomiting, abdominal pain, diarrhea, constipation,blood in stool and abdominal distention.  Genitourinary: Denies dysuria, urgency, frequency, hematuria, flank pain and difficulty urinating.  Musculoskeletal: +arthralgias  Skin: Denies pallor, rash and wound.  Neurological: Denies dizziness, seizures, syncope, weakness, lightheadedness, numbness and headaches.   Past Medical History  Diagnosis Date  . Stroke     3 strokes (2004, 2006, 2007)  . Diabetes mellitus   . Depression   . Hypertension   . ANEMIA, NORMOCYTIC, CHRONIC 11/17/2007    Qualifier: Diagnosis of  By: Cruzita Lederer MD, Salena Saner    . Dementia of frontal lobe type 12/17/2010  . HYPERLIPIDEMIA 04/24/2006    Qualifier: Diagnosis of  By: Cruzita Lederer MD, Salena Saner    . RENAL INSUFFICIENCY, CHRONIC 04/25/2006    Qualifier: Diagnosis of  By: Cruzita Lederer MD, Salena Saner    . GERD (gastroesophageal reflux disease)   . Colon polyp 06/12/12    Colonoscopy  and polypectomy by Dr. Deatra Ina revealed tubular adenoma and tubulovillous adenoma   Current Outpatient Prescriptions  Medication Sig Dispense Refill  . amitriptyline (ELAVIL) 50 MG tablet Take 50 mg by mouth at bedtime.      Marland Kitchen amLODipine (NORVASC) 10 MG tablet Take 10 mg by mouth daily.      Marland Kitchen atorvastatin (LIPITOR) 40 MG tablet Take 1 tablet (40 mg total) by mouth daily.  90 tablet  2  . dipyridamole-aspirin (AGGRENOX) 200-25 MG per 12 hr capsule Take 1 capsule by mouth 2 (two) times daily.  60 capsule  11  . guaiFENesin (MUCINEX) 600 MG 12 hr tablet Take 1,200 mg by mouth 2 (two) times daily.      Marland Kitchen losartan (COZAAR) 100 MG tablet Take 100 mg by mouth daily.      . metFORMIN (GLUCOPHAGE) 500 MG tablet Take 1 tablet (500 mg total) by mouth 2 (two) times daily with a meal.  180 tablet  3  . omeprazole (PRILOSEC) 20 MG capsule Take 20 mg by mouth daily.      . risperiDONE (RISPERDAL) 1 MG tablet TAKE 1 TABLET BY MOUTH TWICE DAILY  60 tablet  5   No current facility-administered medications for this visit.   Family History  Problem Relation Age of Onset  . Diabetes Maternal Grandmother   . Colon cancer Neg Hx   . Stroke Mother    History   Social History  . Marital Status: Married    Spouse Name: N/A    Number of Children: N/A  . Years of Education: N/A  Social History Main Topics  . Smoking status: Former Smoker    Quit date: 04/29/2005  . Smokeless tobacco: Never Used  . Alcohol Use: No  . Drug Use: No  . Sexual Activity: Not on file   Other Topics Concern  . Not on file   Social History Narrative   Patient lives at home with his wife. His son helps keep an eye on him while the wife is at work.    Objective:  Physical Exam: Filed Vitals:   02/12/13 1053  BP: 152/87  Pulse: 103  Temp: 98.4 F (36.9 C)  TempSrc: Oral  Height: 5\' 1"  (1.549 m)  Weight: 256 lb 4.8 oz (116.257 kg)  SpO2: 96%    HEENT: PERRL, EOMI, no scleral icterus Cardiac: RRR, no rubs, murmurs  or gallops Pulm: clear to auscultation bilaterally, moving normal volumes of air Abd: soft, nontender, nondistended, BS present Ext: warm and well perfused, no pedal edema, well healed wound on left lateral thigh Neuro: alert and oriented X3, cranial nerves II-XII grossly intact MOCA: 15/30 --  Visiospatial 3/5 Naming 1/3 Attn 0/6 Lang 2/3 Abstraction 1/2 Delayed recall 3/5 Orientation: 5/6  Assessment & Plan:  Case and care discussed with Dr. Daryll Drown.  Please see problem oriented charting for further details. Patient to return in 6 weeks for routine DM and HTN follow up.

## 2013-02-12 NOTE — Assessment & Plan Note (Signed)
No fall since ED visit a few weeks ago.  Wound from laceration has healed up beautifully.    -Follow up PT

## 2013-02-12 NOTE — Assessment & Plan Note (Signed)
BP Readings from Last 3 Encounters:  02/12/13 152/87  02/02/13 131/79  01/19/13 136/71    Lab Results  Component Value Date   NA 144 01/18/2013   K 5.3 01/18/2013   CREATININE 1.30 01/18/2013    Assessment: Blood pressure control:  elevated Progress toward BP goal:   deteriorated  Plan: Medications:  continue current medications - losartan 100, amlodipine 10 Educational resources provided: brochure;handout;video Self management tools provided:   Other plans: return with BP machine and home log at next visit, if elevated, consider addn of hctz 12.5 given h/o stroke and DM

## 2013-02-12 NOTE — Assessment & Plan Note (Signed)
Wife requests a prescription strength guaifenesin due to patient seeming like he has chest congestion.  To note, he had CXR in 02/2012 that was unrevealing and this is a long standing problem that seemingly predates that CXR.    -Trial of omeprazole 20 bid -If no improvement, consider repeat CXR -Also, acquire more history regarding s/s of aspiration , may consider swallow study if patient would be willing to perform diet modifications if needed.

## 2013-02-12 NOTE — Patient Instructions (Addendum)
-  Try to stop taking mucinex and increase omeprazole to 20mg  twice daily -We will work on figuring out what is going on with your physical therapy  -Next time, please bring your blood pressure machine and log book with you  Please be sure to bring all of your medications with you to every visit.  Should you have any new or worsening symptoms, please be sure to call the clinic at [phone].

## 2013-02-15 NOTE — Progress Notes (Signed)
Case discussed with Dr. Sharda soon after the resident saw the patient.  We reviewed the resident's history and exam and pertinent patient test results.  I agree with the assessment, diagnosis, and plan of care documented in the resident's note. 

## 2013-02-25 ENCOUNTER — Ambulatory Visit: Payer: Medicare Other | Attending: Internal Medicine

## 2013-02-25 DIAGNOSIS — Z8673 Personal history of transient ischemic attack (TIA), and cerebral infarction without residual deficits: Secondary | ICD-10-CM | POA: Insufficient documentation

## 2013-02-25 DIAGNOSIS — R262 Difficulty in walking, not elsewhere classified: Secondary | ICD-10-CM | POA: Insufficient documentation

## 2013-02-25 DIAGNOSIS — R279 Unspecified lack of coordination: Secondary | ICD-10-CM | POA: Insufficient documentation

## 2013-02-25 DIAGNOSIS — IMO0001 Reserved for inherently not codable concepts without codable children: Secondary | ICD-10-CM | POA: Insufficient documentation

## 2013-03-01 ENCOUNTER — Ambulatory Visit (INDEPENDENT_AMBULATORY_CARE_PROVIDER_SITE_OTHER): Payer: Medicare Other | Admitting: Podiatry

## 2013-03-01 ENCOUNTER — Encounter: Payer: Self-pay | Admitting: Podiatry

## 2013-03-01 VITALS — BP 145/82 | HR 112 | Resp 16

## 2013-03-01 DIAGNOSIS — M79609 Pain in unspecified limb: Secondary | ICD-10-CM

## 2013-03-01 DIAGNOSIS — B351 Tinea unguium: Secondary | ICD-10-CM

## 2013-03-01 NOTE — Patient Instructions (Signed)
Diabetes and Foot Care Diabetes may cause you to have problems because of poor blood supply (circulation) to your feet and legs. This may cause the skin on your feet to become thinner, break easier, and heal more slowly. Your skin may become dry, and the skin may peel and crack. You may also have nerve damage in your legs and feet causing decreased feeling in them. You may not notice minor injuries to your feet that could lead to infections or more serious problems. Taking care of your feet is one of the most important things you can do for yourself.  HOME CARE INSTRUCTIONS  Wear shoes at all times, even in the house. Do not go barefoot. Bare feet are easily injured.  Check your feet daily for blisters, cuts, and redness. If you cannot see the bottom of your feet, use a mirror or ask someone for help.  Wash your feet with warm water (do not use hot water) and mild soap. Then pat your feet and the areas between your toes until they are completely dry. Do not soak your feet as this can dry your skin.  Apply a moisturizing lotion or petroleum jelly (that does not contain alcohol and is unscented) to the skin on your feet and to dry, brittle toenails. Do not apply lotion between your toes.  Trim your toenails straight across. Do not dig under them or around the cuticle. File the edges of your nails with an emery board or nail file.  Do not cut corns or calluses or try to remove them with medicine.  Wear clean socks or stockings every day. Make sure they are not too tight. Do not wear knee-high stockings since they may decrease blood flow to your legs.  Wear shoes that fit properly and have enough cushioning. To break in new shoes, wear them for just a few hours a day. This prevents you from injuring your feet. Always look in your shoes before you put them on to be sure there are no objects inside.  Do not cross your legs. This may decrease the blood flow to your feet.  If you find a minor scrape,  cut, or break in the skin on your feet, keep it and the skin around it clean and dry. These areas may be cleansed with mild soap and water. Do not cleanse the area with peroxide, alcohol, or iodine.  When you remove an adhesive bandage, be sure not to damage the skin around it.  If you have a wound, look at it several times a day to make sure it is healing.  Do not use heating pads or hot water bottles. They may burn your skin. If you have lost feeling in your feet or legs, you may not know it is happening until it is too late.  Make sure your health care provider performs a complete foot exam at least annually or more often if you have foot problems. Report any cuts, sores, or bruises to your health care provider immediately. SEEK MEDICAL CARE IF:   You have an injury that is not healing.  You have cuts or breaks in the skin.  You have an ingrown nail.  You notice redness on your legs or feet.  You feel burning or tingling in your legs or feet.  You have pain or cramps in your legs and feet.  Your legs or feet are numb.  Your feet always feel cold. SEEK IMMEDIATE MEDICAL CARE IF:   There is increasing redness,   swelling, or pain in or around a wound.  There is a red line that goes up your leg.  Pus is coming from a wound.  You develop a fever or as directed by your health care provider.  You notice a bad smell coming from an ulcer or wound. Document Released: 12/29/1999 Document Revised: 09/02/2012 Document Reviewed: 06/09/2012 [employer] Patient Information 2014 [employer].  

## 2013-03-02 ENCOUNTER — Ambulatory Visit: Payer: Medicare Other | Admitting: Physical Therapy

## 2013-03-02 NOTE — Progress Notes (Signed)
Subjective:     Patient ID: [name] [name], male   DOB: [birth year], 63 y.o.   MRN: [id number]  HPI patient presents with thick painful nailbeds 1-5 both feet that he cannot cut himself  Review of Systems     Objective:   Physical Exam Neurovascular status unchanged well oriented 3 with thick painful nailbeds 1-5 both feet    Assessment:     Mycotic nail infection with pain 1-5 both feet    Plan:     Debridement painful nailbeds 1-5 both feet no iatrogenic bleeding

## 2013-03-04 ENCOUNTER — Ambulatory Visit: Payer: Medicare Other

## 2013-03-09 ENCOUNTER — Ambulatory Visit: Payer: Medicare Other

## 2013-03-11 ENCOUNTER — Encounter: Payer: Medicare Other | Admitting: Physical Therapy

## 2013-03-16 ENCOUNTER — Other Ambulatory Visit: Payer: Self-pay | Admitting: Internal Medicine

## 2013-03-16 ENCOUNTER — Ambulatory Visit: Payer: Medicare Other | Attending: Internal Medicine | Admitting: Physical Therapy

## 2013-03-16 DIAGNOSIS — R279 Unspecified lack of coordination: Secondary | ICD-10-CM | POA: Insufficient documentation

## 2013-03-16 DIAGNOSIS — Z8673 Personal history of transient ischemic attack (TIA), and cerebral infarction without residual deficits: Secondary | ICD-10-CM | POA: Insufficient documentation

## 2013-03-16 DIAGNOSIS — M79672 Pain in left foot: Principal | ICD-10-CM

## 2013-03-16 DIAGNOSIS — IMO0001 Reserved for inherently not codable concepts without codable children: Secondary | ICD-10-CM | POA: Insufficient documentation

## 2013-03-16 DIAGNOSIS — R262 Difficulty in walking, not elsewhere classified: Secondary | ICD-10-CM | POA: Insufficient documentation

## 2013-03-16 DIAGNOSIS — M79671 Pain in right foot: Secondary | ICD-10-CM

## 2013-03-16 NOTE — Telephone Encounter (Signed)
Rx sent to pharmacy, will forward to attending for signature.Regenia Skeeter, Darlene Cassady3/3/20159:40 AM

## 2013-03-18 ENCOUNTER — Ambulatory Visit: Payer: Medicare Other

## 2013-03-23 ENCOUNTER — Ambulatory Visit: Payer: Medicare Other | Admitting: Physical Therapy

## 2013-03-25 ENCOUNTER — Ambulatory Visit: Payer: Medicare Other

## 2013-03-28 ENCOUNTER — Other Ambulatory Visit: Payer: Self-pay | Admitting: Internal Medicine

## 2013-03-28 DIAGNOSIS — Z8673 Personal history of transient ischemic attack (TIA), and cerebral infarction without residual deficits: Secondary | ICD-10-CM

## 2013-03-30 ENCOUNTER — Ambulatory Visit: Payer: Medicare Other

## 2013-04-02 ENCOUNTER — Ambulatory Visit: Payer: Medicare Other

## 2013-04-06 ENCOUNTER — Ambulatory Visit: Payer: Medicare Other | Admitting: Physical Therapy

## 2013-04-08 ENCOUNTER — Ambulatory Visit: Payer: Medicare Other | Admitting: Physical Therapy

## 2013-04-13 ENCOUNTER — Ambulatory Visit: Payer: Medicare Other | Admitting: Physical Therapy

## 2013-04-15 ENCOUNTER — Ambulatory Visit: Payer: Medicare Other | Admitting: Physical Therapy

## 2013-04-15 ENCOUNTER — Ambulatory Visit: Payer: Medicare Other | Attending: Internal Medicine | Admitting: Physical Therapy

## 2013-04-15 DIAGNOSIS — Z8673 Personal history of transient ischemic attack (TIA), and cerebral infarction without residual deficits: Secondary | ICD-10-CM | POA: Insufficient documentation

## 2013-04-15 DIAGNOSIS — R279 Unspecified lack of coordination: Secondary | ICD-10-CM | POA: Insufficient documentation

## 2013-04-15 DIAGNOSIS — IMO0001 Reserved for inherently not codable concepts without codable children: Secondary | ICD-10-CM | POA: Insufficient documentation

## 2013-04-15 DIAGNOSIS — R262 Difficulty in walking, not elsewhere classified: Secondary | ICD-10-CM | POA: Insufficient documentation

## 2013-04-20 ENCOUNTER — Ambulatory Visit: Payer: Medicare Other

## 2013-04-22 ENCOUNTER — Ambulatory Visit: Payer: Medicare Other | Admitting: Physical Therapy

## 2013-05-05 ENCOUNTER — Other Ambulatory Visit: Payer: Self-pay | Admitting: Internal Medicine

## 2013-05-05 ENCOUNTER — Encounter: Payer: Self-pay | Admitting: *Deleted

## 2013-05-05 NOTE — Telephone Encounter (Signed)
Error  Pt on blood thinner needs an ov

## 2013-05-07 ENCOUNTER — Encounter: Payer: Self-pay | Admitting: Gastroenterology

## 2013-05-11 ENCOUNTER — Ambulatory Visit: Payer: Medicare Other | Admitting: Occupational Therapy

## 2013-05-16 ENCOUNTER — Other Ambulatory Visit: Payer: Self-pay | Admitting: Internal Medicine

## 2013-05-17 ENCOUNTER — Ambulatory Visit: Payer: Medicare Other | Attending: Internal Medicine | Admitting: Occupational Therapy

## 2013-05-17 DIAGNOSIS — IMO0001 Reserved for inherently not codable concepts without codable children: Secondary | ICD-10-CM | POA: Insufficient documentation

## 2013-05-17 DIAGNOSIS — R279 Unspecified lack of coordination: Secondary | ICD-10-CM | POA: Insufficient documentation

## 2013-05-17 DIAGNOSIS — Z8673 Personal history of transient ischemic attack (TIA), and cerebral infarction without residual deficits: Secondary | ICD-10-CM | POA: Insufficient documentation

## 2013-05-17 DIAGNOSIS — R262 Difficulty in walking, not elsewhere classified: Secondary | ICD-10-CM | POA: Insufficient documentation

## 2013-05-21 ENCOUNTER — Ambulatory Visit: Payer: Medicare Other | Admitting: Occupational Therapy

## 2013-05-24 ENCOUNTER — Encounter: Payer: Self-pay | Admitting: Podiatry

## 2013-05-24 ENCOUNTER — Ambulatory Visit (INDEPENDENT_AMBULATORY_CARE_PROVIDER_SITE_OTHER): Payer: Medicare Other | Admitting: Podiatry

## 2013-05-24 ENCOUNTER — Ambulatory Visit: Payer: Medicare Other | Admitting: Occupational Therapy

## 2013-05-24 VITALS — BP 148/76 | HR 82 | Resp 12

## 2013-05-24 DIAGNOSIS — M79609 Pain in unspecified limb: Secondary | ICD-10-CM

## 2013-05-24 DIAGNOSIS — B351 Tinea unguium: Secondary | ICD-10-CM

## 2013-05-24 NOTE — Progress Notes (Signed)
Subjective:     Patient ID: [name] [name], male   DOB: 1950/02/09, 63 y.o.   MRN: [id number]  HPI patient presents with thick nail bed 1-5 both feet that are painful in impossible for him to cut   Review of Systems     Objective:   Physical Exam Neurovascular status unchanged with thick nailbeds that are brittle 1-5 both feet    Assessment:     Mycotic nail infection with pain 1-5 both feet    Plan:     Debridement painful nailbeds 1-5 both feet with no bleeding noted

## 2013-05-25 ENCOUNTER — Ambulatory Visit: Payer: Medicare Other | Admitting: Occupational Therapy

## 2013-05-27 ENCOUNTER — Encounter: Payer: Medicare Other | Admitting: Occupational Therapy

## 2013-06-05 ENCOUNTER — Other Ambulatory Visit: Payer: Self-pay | Admitting: Internal Medicine

## 2013-06-11 ENCOUNTER — Encounter: Payer: Self-pay | Admitting: Internal Medicine

## 2013-06-11 ENCOUNTER — Ambulatory Visit (INDEPENDENT_AMBULATORY_CARE_PROVIDER_SITE_OTHER): Payer: Medicare Other | Admitting: Internal Medicine

## 2013-06-11 VITALS — BP 145/90 | HR 104 | Temp 98.9°F | Ht 61.0 in | Wt 251.2 lb

## 2013-06-11 DIAGNOSIS — I10 Essential (primary) hypertension: Secondary | ICD-10-CM

## 2013-06-11 DIAGNOSIS — E119 Type 2 diabetes mellitus without complications: Secondary | ICD-10-CM

## 2013-06-11 DIAGNOSIS — E785 Hyperlipidemia, unspecified: Secondary | ICD-10-CM

## 2013-06-11 LAB — LIPID PANEL
CHOL/HDL RATIO: 3.5 ratio
Cholesterol: 139 mg/dL (ref 0–200)
HDL: 40 mg/dL (ref >39)
LDL Cholesterol: 61 mg/dL (ref 0–99)
Triglycerides: 190 mg/dL — ABNORMAL HIGH (ref <150)
VLDL: 38 mg/dL (ref 0–40)

## 2013-06-11 LAB — GLUCOSE, CAPILLARY: GLUCOSE-CAPILLARY: 119 mg/dL — AB (ref 70–99)

## 2013-06-11 LAB — POCT GLYCOSYLATED HEMOGLOBIN (HGB A1C): Hemoglobin A1C: 6.3

## 2013-06-11 NOTE — Assessment & Plan Note (Signed)
Lipid profile today. Compliant with atorvastatin 40mg  daily.
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
## 2013-06-11 NOTE — Assessment & Plan Note (Signed)
BP Readings from Last 3 Encounters:  06/11/13 145/90  05/24/13 148/76  03/01/13 145/82    Lab Results  Component Value Date   NA 144 01/18/2013   K 5.3 01/18/2013   CREATININE 1.30 01/18/2013    Assessment: Blood pressure control: mildly elevated Progress toward BP goal:  at goal Comments: while BP has been >140/90 on several occasions, due to his dementia, patient is not able to describe when/if he is symptomatic (ie - dizziness/light headedness) and has been followed for falls in clinic before.  For this reason, we decided against escalating antihypertensive regimen  Plan: Medications:  continue current medications - losartan 100, amlodipine 10 Educational resources provided: brochure;handout;video

## 2013-06-11 NOTE — Assessment & Plan Note (Signed)
Lab Results  Component Value Date   HGBA1C 6.3 06/11/2013   HGBA1C 6.5 12/03/2012   HGBA1C 6.4 06/11/2012     Assessment: Diabetes control: good control (HgbA1C at goal) Progress toward A1C goal:  at goal  Plan: Medications:  continue current medications - metformin 500 bid Home glucose monitoring: Frequency: no home glucose monitoring Timing:   Instruction/counseling given: reminded to bring medications to each visit Educational resources provided: brochure;handout
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
## 2013-06-14 NOTE — Progress Notes (Signed)
INTERNAL MEDICINE TEACHING ATTENDING ADDENDUM - Kwabena Strutz, MD: I reviewed and discussed at the time of visit with the resident Dr. Sharda, the patient's medical history, physical examination, diagnosis and results of tests and treatment and I agree with the patient's care as documented.    

## 2013-07-16 ENCOUNTER — Other Ambulatory Visit: Payer: Self-pay | Admitting: Internal Medicine

## 2013-07-31 ENCOUNTER — Other Ambulatory Visit: Payer: Self-pay | Admitting: Internal Medicine

## 2013-08-02 NOTE — Telephone Encounter (Signed)
Dr Burnard Bunting was Rxing and noted he was sleeping better. Needs Aug / Sept appt with PCP.

## 2013-08-19 ENCOUNTER — Other Ambulatory Visit: Payer: Self-pay | Admitting: Internal Medicine

## 2013-08-23 ENCOUNTER — Other Ambulatory Visit: Payer: Medicare Other

## 2013-09-07 ENCOUNTER — Other Ambulatory Visit: Payer: Self-pay | Admitting: *Deleted

## 2013-09-09 MED ORDER — ASPIRIN-DIPYRIDAMOLE ER 25-200 MG PO CP12: 1.0000 | ORAL_CAPSULE | Freq: Two times a day (BID) | ORAL | Status: DC

## 2013-09-18 ENCOUNTER — Other Ambulatory Visit: Payer: Self-pay | Admitting: Internal Medicine

## 2013-09-22 ENCOUNTER — Other Ambulatory Visit: Payer: Self-pay | Admitting: *Deleted

## 2013-09-22 MED ORDER — AMITRIPTYLINE HCL 50 MG PO TABS
50.0000 mg | ORAL_TABLET | Freq: Every day | ORAL | Status: DC
Start: 2013-09-22 — End: 2014-05-13

## 2013-09-22 MED ORDER — METFORMIN HCL 500 MG PO TABS: 500.0000 mg | ORAL_TABLET | Freq: Two times a day (BID) | ORAL | Status: DC

## 2013-09-29 ENCOUNTER — Ambulatory Visit (INDEPENDENT_AMBULATORY_CARE_PROVIDER_SITE_OTHER): Payer: Medicare Other | Admitting: Internal Medicine

## 2013-09-29 ENCOUNTER — Other Ambulatory Visit: Payer: Self-pay | Admitting: *Deleted

## 2013-09-29 VITALS — BP 132/82 | HR 90 | Temp 98.2°F | Wt 234.7 lb

## 2013-09-29 DIAGNOSIS — M545 Low back pain, unspecified: Secondary | ICD-10-CM

## 2013-09-29 DIAGNOSIS — F0280 Dementia in other diseases classified elsewhere without behavioral disturbance: Secondary | ICD-10-CM

## 2013-09-29 DIAGNOSIS — G3109 Other frontotemporal dementia: Secondary | ICD-10-CM

## 2013-09-29 MED ORDER — NAPROXEN 500 MG PO TABS: 500.0000 mg | ORAL_TABLET | Freq: Two times a day (BID) | ORAL | Status: DC

## 2013-09-29 MED ORDER — CYCLOBENZAPRINE HCL 10 MG PO TABS: 10.0000 mg | ORAL_TABLET | Freq: Every day | ORAL | Status: DC

## 2013-09-29 NOTE — Patient Instructions (Addendum)
Start taking Flexeril 10mg  once a day at bedtime for pain. This medication can make you drowsy. You can also start taking naproxen 500mg  every 12 hours as need for pain.

## 2013-09-30 MED ORDER — LOSARTAN POTASSIUM 100 MG PO TABS: ORAL_TABLET | ORAL | Status: DC
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
## 2013-09-30 NOTE — Assessment & Plan Note (Signed)
Pt's primary care giver his wife states his concentration has decreased. Mini mental exam was done and patient scored 21/30 suggesting mild cognitive impairment which is unchanged from 8 months ago when Dr. Burnard Bunting performed the [facility] exam that revealed cognitive impairment as well. Pt was not able to state the date and stated that this year was 2005. He was also not able continue subtracting 3 from 10. He was able to get 7 but not able to complete subtracting. Dementia appears stable, will continue to monitor.

## 2013-09-30 NOTE — Progress Notes (Signed)
INTERNAL MEDICINE TEACHING ATTENDING ADDENDUM - Aldine Contes, MD: I personally saw and evaluated [name] [name] in this clinic visit in conjunction with the resident, Dr. Hulen Luster. I have discussed patient's plan of care with medical resident during this visit. I have confirmed the physical exam findings and have read and agree with the clinic note including the plan with the following addition: - Pt here for f/u of back pain - restarted on flexeril. C/w naproxen - Pt with mini mental score of 21/30. Stable for now. Will need f/u

## 2013-09-30 NOTE — Assessment & Plan Note (Signed)
Pt reports 6/10 in severity localized lower back pain. He has full range of motion in back on physical exam and was non tender to palpation on my physical exam. He was previously prescribed flexeril but unable to get it due to cost. However, informed patient that flexeril in on the $4 at [facility]. Wife interested in trying to get flexeril again.   - rx for flexeril 10mg  QHS and naproxen 500mg  q1h hours for pain.

## 2013-10-05 ENCOUNTER — Telehealth: Payer: Self-pay | Admitting: *Deleted

## 2013-10-05 NOTE — Telephone Encounter (Signed)
Returned pt's call - message left per wife for refills for pt; called [phone], mailbox full - unable to leave a message and the other # on pt's chart Has been disconnected.

## 2013-10-06 ENCOUNTER — Encounter: Payer: Medicare Other | Admitting: Internal Medicine

## 2013-10-20 ENCOUNTER — Other Ambulatory Visit: Payer: Self-pay | Admitting: Internal Medicine

## 2013-10-23 ENCOUNTER — Other Ambulatory Visit: Payer: Self-pay | Admitting: Internal Medicine

## 2013-11-15 ENCOUNTER — Ambulatory Visit (INDEPENDENT_AMBULATORY_CARE_PROVIDER_SITE_OTHER): Payer: Medicare Other | Admitting: Podiatry

## 2013-11-15 DIAGNOSIS — M79673 Pain in unspecified foot: Secondary | ICD-10-CM

## 2013-11-15 DIAGNOSIS — B351 Tinea unguium: Secondary | ICD-10-CM

## 2013-11-16 NOTE — Progress Notes (Signed)
Subjective:     Patient ID: [name] [name], male   DOB: May 24, 1950, 63 y.o.   MRN: [id number]  HPIpatient presents with thick yellow brittle nailbeds 1-5 both feet that are painful   Review of Systems     Objective:   Physical Exam Neurovascular status unchanged with thick yellow brittle nailbeds 1-5 of both feet    Assessment:     Mycotic nail infection with pain 1-5 both feet    Plan:     Debris painful nailbeds 1-5 both feet with no iatrogenic bleeding noted

## 2013-11-25 ENCOUNTER — Other Ambulatory Visit: Payer: Self-pay | Admitting: Internal Medicine

## 2013-11-25 ENCOUNTER — Ambulatory Visit (INDEPENDENT_AMBULATORY_CARE_PROVIDER_SITE_OTHER): Payer: Medicare Other | Admitting: Internal Medicine

## 2013-11-25 ENCOUNTER — Encounter: Payer: Self-pay | Admitting: Internal Medicine

## 2013-11-25 VITALS — BP 126/76 | HR 89 | Temp 98.2°F | Ht 66.0 in | Wt 230.3 lb

## 2013-11-25 DIAGNOSIS — F0280 Dementia in other diseases classified elsewhere without behavioral disturbance: Secondary | ICD-10-CM

## 2013-11-25 DIAGNOSIS — R400 Somnolence: Secondary | ICD-10-CM

## 2013-11-25 DIAGNOSIS — G3109 Other frontotemporal dementia: Secondary | ICD-10-CM

## 2013-11-25 DIAGNOSIS — Z0000 Encounter for general adult medical examination without abnormal findings: Secondary | ICD-10-CM | POA: Insufficient documentation

## 2013-11-25 DIAGNOSIS — E119 Type 2 diabetes mellitus without complications: Secondary | ICD-10-CM

## 2013-11-25 DIAGNOSIS — Z23 Encounter for immunization: Secondary | ICD-10-CM

## 2013-11-25 DIAGNOSIS — I10 Essential (primary) hypertension: Secondary | ICD-10-CM

## 2013-11-25 LAB — POCT GLYCOSYLATED HEMOGLOBIN (HGB A1C): HEMOGLOBIN A1C: 5.9

## 2013-11-25 LAB — GLUCOSE, CAPILLARY: Glucose-Capillary: 86 mg/dL (ref 70–99)

## 2013-11-25 MED ORDER — RISPERIDONE 1 MG PO TABS: 1.0000 mg | ORAL_TABLET | Freq: Every day | ORAL | Status: DC

## 2013-11-25 MED ORDER — GUAIFENESIN-DM 100-10 MG/5ML PO SYRP: 5.0000 mL | ORAL_SOLUTION | ORAL | Status: DC | PRN

## 2013-11-25 NOTE — Assessment & Plan Note (Signed)
Increased sleepiness all day x1 week with worsening confusion. Patient and wife also feel like less sensation left side over time than before. Remote infarctions on prior CT head 2012. Wife states left side has been weak since prior strokes.   Based on my exam today and also that the wife was not the greatest historian and in and out of sleep, it was difficult to get a good feel on what symptoms are old and what is new for the patient, however she is very clear on that he has been more confused and sleepy lately with no major changes in medications.   Polypharmacy could be contributing as he is on elavil, flexeril, and risperdal. Thus will decrease risperdal to just qhs dosing for now, d/c flexeril, and continue elavil for now to see if any improvement. Given significant decrease in sensation on LUE vs. RUE, and complaints of acute change in mental status, cannot rule out CVA and will order CT head for now.   Neurology referral also placed

## 2013-11-25 NOTE — Progress Notes (Signed)
Subjective:   Patient ID: [name] [name] male   DOB: 09-Apr-1950 63 y.o.   MRN: [id number]  HPI: [name] [name] is a 63 y.o. male with prior hx of CVA, HTN, and DM2 and other PMH as listed below presenting to opc today for acute visit.   Confusion--wife feels like he has been more confused lately and more sleepy x1 week.  She says she he has been sleeping throughout the day. He says he feels a little tired. He is able to identify his name, DOB, anniversary (same day as DOB), wife's name and daughter's name and address. Cannot tell phone number and word recall 1/3 after a few minutes. Able to count to 10 and backwards, able to do one serial 7 subtraction from 100 but not anything past 93.   Wife also endorses he feels like he feels less on his left side recently. He is taking risperdal twice a day because he "acts out" per the wife and it is controlling that. He is also noted to be on flexeril and elavil.   Past Medical History  Diagnosis Date  . Stroke     3 strokes (2004, 2006, 2007)  . Diabetes mellitus   . Depression   . Hypertension   . ANEMIA, NORMOCYTIC, CHRONIC 11/17/2007    Qualifier: Diagnosis of  By: Cruzita Lederer MD, Salena Saner    . Dementia of frontal lobe type 12/17/2010  . HYPERLIPIDEMIA 04/24/2006    Qualifier: Diagnosis of  By: Cruzita Lederer MD, Salena Saner    . RENAL INSUFFICIENCY, CHRONIC 04/25/2006    Qualifier: Diagnosis of  By: Cruzita Lederer MD, Salena Saner    . GERD (gastroesophageal reflux disease)   . Colon polyp 06/12/12    Colonoscopy and polypectomy by Dr. Deatra Ina revealed tubular adenoma and tubulovillous adenoma   Current Outpatient Prescriptions  Medication Sig Dispense Refill  . amitriptyline (ELAVIL) 50 MG tablet Take 1 tablet (50 mg total) by mouth at bedtime. 90 tablet 4  . amLODipine (NORVASC) 10 MG tablet TAKE 1 TABLET BY MOUTH EVERY DAY 90 tablet 2  . atorvastatin (LIPITOR) 40 MG tablet TAKE 1 TABLET BY MOUTH ONCE DAILY 90 tablet 2  . cyclobenzaprine (FLEXERIL) 10 MG tablet  Take 1 tablet (10 mg total) by mouth at bedtime. 30 tablet 2  . dipyridamole-aspirin (AGGRENOX) 200-25 MG per 12 hr capsule Take 1 capsule by mouth 2 (two) times daily. 180 capsule 4  . losartan (COZAAR) 100 MG tablet TAKE 1 TABLET BY MOUTH DAILY 90 tablet 2  . omeprazole (PRILOSEC) 20 MG capsule TAKE ONE CAPSULE BY MOUTH TWICE DAILY BEFORE A MEAL 60 capsule 0  . risperiDONE (RISPERDAL) 1 MG tablet Take 1 tablet (1 mg total) by mouth at bedtime. 60 tablet 5  . guaiFENesin-dextromethorphan (ROBITUSSIN DM) 100-10 MG/5ML syrup Take 5 mLs by mouth every 4 (four) hours as needed for cough. 118 mL 0  . metFORMIN (GLUCOPHAGE) 500 MG tablet Take 1 tablet (500 mg total) by mouth 2 (two) times daily with a meal. 180 tablet 4  . naproxen (NAPROSYN) 500 MG tablet Take 1 tablet (500 mg total) by mouth every 12 (twelve) hours. 60 tablet 2   No current facility-administered medications for this visit.   Family History  Problem Relation Age of Onset  . Diabetes Maternal Grandmother   . Colon cancer Neg Hx   . Stroke Mother    History   Social History  . Marital Status: Married    Spouse Name: N/A    Number of  Children: N/A  . Years of Education: N/A   Social History Main Topics  . Smoking status: Former Smoker    Quit date: 04/29/2005  . Smokeless tobacco: Never Used  . Alcohol Use: No  . Drug Use: No  . Sexual Activity: None   Other Topics Concern  . None   Social History Narrative   Patient lives at home with his wife. His son helps keep an eye on him while the wife is at work.   Review of Systems:  Constitutional:  Denies fever, chills  Respiratory:  Denies SOB  Cardiovascular:  Denies chest pain  Gastrointestinal:  Denies nausea, vomiting, abdominal pain  Musculoskeletal:  Walks with cane, weakness left side of body   Skin:  Denies pallor, rash and wound.   Neurological:  Denies headaches.    Objective:  Physical Exam: Filed Vitals:   11/25/13 1149  BP: 188/109  Pulse: 95    Temp: 98.2 F (36.8 C)  TempSrc: Oral  Height: 5\' 6"  (1.676 m)  Weight: 230 lb 4.8 oz (104.463 kg)  SpO2: 98%   Vitals reviewed. General: sitting in chair, NAD HEENT: EOMI Cardiac: RRR Pulm: clear to auscultation bilaterally Abd: soft, BS present Ext: moving all 4 extremities Neuro: alert and oriented to person and place, cn 7 testing intact, finger to nose testing notable for past pointing with left hand and tremor of left hand, able to stand up on his own and walks with stable gait and with cane, sensation on face intact and equal on both sides, decreased sensation of left upper extremity compared to right and also on lower extremities, decreased strength LUE compared to RUE.  Memory testing, able to recall only 1/3 object names after a few minutes. Serial sevens only x1 100-7 which took at least 1-2 minutes to do. Correctly identifies birthday, wife, anniversary, home address, daughter name  Assessment & Plan:  Discussed with Dr. Daryll Drown

## 2013-11-25 NOTE — Assessment & Plan Note (Addendum)
BP Readings from Last 3 Encounters:  11/25/13 126/76  09/29/13 132/82  06/11/13 145/90   Lab Results  Component Value Date   NA 144 01/18/2013   K 5.3 01/18/2013   CREATININE 1.30 01/18/2013   Assessment: Blood pressure control: controlled Progress toward BP goal:  at goal Comments: repeat much improved  Plan: Medications:  continue current medications losartan 100mg  and norvasc 10mg  Educational resources provided:   Self management tools provided:   Other plans: monitor and recheck next visit

## 2013-11-25 NOTE — Assessment & Plan Note (Signed)
Flu vaccine today, a1c check, foot exam

## 2013-11-25 NOTE — Patient Instructions (Addendum)
General Instructions:  Please bring your medicines with you each time you come to clinic.  Medicines may include prescription medications, over-the-counter medications, herbal remedies, eye drops, vitamins, or other pills.  Please follow up with your pcp on next available appointment within a month  Lets try risperdal only once at night to see if that helps, instead of twice a day  We will try to get you to the neurologist office  Lets try robitussin dm for the cough and congestion  If you notice any stroke like symptoms please let me know right away, sudden weakness, slurred speech, more confusion and sleep, trouble walking   Please stop the flexeril as well as that may be causing sedation  Progress Toward Treatment Goals:  Treatment Goal 11/25/2013  Hemoglobin A1C -  Blood pressure at goal    Self Care Goals & Plans:  Self Care Goal 06/11/2013  Manage my medications take my medicines as prescribed; bring my medications to every visit; refill my medications on time; follow the sick day instructions if I am sick  Monitor my health keep track of my blood glucose; keep track of my blood pressure; keep track of my weight; check my feet daily  Eat healthy foods eat more vegetables; eat fruit for snacks and desserts; eat foods that are low in salt; eat smaller portions; drink diet soda or water instead of juice or soda  Be physically active find an activity I enjoy  Meeting treatment goals maintain the current self-care plan    Home Blood Glucose Monitoring 11/25/2013  Check my blood sugar -  When to check my blood sugar before meals     Care Management & Community Referrals:  Referral 08/19/2012  Referrals made for care management support none needed

## 2013-11-25 NOTE — Assessment & Plan Note (Signed)
Lab Results  Component Value Date   HGBA1C 5.9 11/25/2013   HGBA1C 6.3 06/11/2013   HGBA1C 6.5 12/03/2012    Assessment: Diabetes control: good control (HgbA1C at goal) Progress toward A1C goal:    Comments: 5.9 a1c today  Plan: Medications:  continue current medications metformin 500mg  bid Home glucose monitoring: Frequency:   Timing: before meals Instruction/counseling given: reminded to bring blood glucose meter & log to each visit and reminded to bring medications to each visit Educational resources provided:   Self management tools provided:   Other plans: foot exam done today. Needs eye exam.

## 2013-11-25 NOTE — Assessment & Plan Note (Addendum)
Wife reports increased confusion x1 week and more sleepiness.   Will refer to neurology at this time for dementia and memory for now as well CT head ordered

## 2013-11-26 NOTE — Progress Notes (Signed)
Internal Medicine Clinic Attending  Case discussed with Dr. Qureshi soon after the resident saw the patient.  We reviewed the resident's history and exam and pertinent patient test results.  I agree with the assessment, diagnosis, and plan of care documented in the resident's note. 

## 2013-11-26 NOTE — Addendum Note (Signed)
Addended by: Gilles Chiquito B on: 11/26/2013 04:42 PM   Modules accepted: Level of Service

## 2013-11-29 ENCOUNTER — Encounter: Payer: Self-pay | Admitting: *Deleted

## 2014-01-20 ENCOUNTER — Other Ambulatory Visit (HOSPITAL_COMMUNITY): Payer: Self-pay | Admitting: Neurology

## 2014-01-20 ENCOUNTER — Ambulatory Visit (INDEPENDENT_AMBULATORY_CARE_PROVIDER_SITE_OTHER): Payer: Medicare Other | Admitting: Neurology

## 2014-01-20 ENCOUNTER — Encounter: Payer: Self-pay | Admitting: Neurology

## 2014-01-20 VITALS — BP 124/68 | HR 70 | Temp 98.6°F | Resp 18 | Ht 66.0 in | Wt 237.8 lb

## 2014-01-20 DIAGNOSIS — F0151 Vascular dementia with behavioral disturbance: Secondary | ICD-10-CM

## 2014-01-20 DIAGNOSIS — F01518 Vascular dementia, unspecified severity, with other behavioral disturbance: Secondary | ICD-10-CM

## 2014-01-20 NOTE — Progress Notes (Signed)
NEUROLOGY CONSULTATION NOTE  [name] [name] MRN: [id number] DOB: [birth year]  Referring provider: Dr. Daryll Drown Primary care provider: Dr. Hulen Luster  Reason for consult:  dementia  HISTORY OF PRESENT ILLNESS: [name] [name] is a 64 year old right-handed man with hypertension, depression, type II diabetes mellitus, hyperlipidemia, chronic renal insufficiency, GERD, chronic normocytic anemia, and history of 3 strokes (2004, 2006, 2007) who presents for dementia.  He is accompanied by his wife who provides the majority of history.  Records, labs and prior CT and MRIs reviewed.  He has history of multiple strokes dating back from 2004 to 2007.  Most recent MRI of the brain from 05/24/06 shows large area of encephalomalacia in the superior right temporal gyrus and perirolandic cortex and subcortical white matter, as well as multiple chronic lacunar infarcts including the pons, basal ganglia, and thalami.  MRA of the head showed stable occlusion of the posterior division of the right MCA and diffuse atherosclerotic disease involving both cavernous segments of the ICA and posterior cerebral arteries.  MRA of the neck revealed 60% stenosis at origin of the left vertebral artery.  Since the strokes, he has had cognitive impairment.  He has difficulty performing everyday tasks such as dressing and bathing and requires assistance from his wife.  His wife needs to administer his medications to him.  He has had memory deficits.  He has trouble remembering names of people he knows, but recalls faces.  He appears to be apathetic.  When his son passed away, his wife said he did not show any emotion.  He is not belligerent but he would become irritable at times.  Risperidone keeps him calm.  He sometimes feels depressed.  He is mostly quiet and does not have much conversation with his wife.  When he goes to [employer] to see his friends, he will sometimes go sit at another table.  He sleeps well at night, however he naps most  of the day.  His risperidone was decreased and cyclobenzaprine was discontinued, and this has helped a little but he is still exhibits excessive daytime sleepiness.  He has baseline left-sided weakness.  He has a cane but often doesn't use it.  Hgb A1c from 11/25/13 was 5.9.  Lipid panel from 06/11/13 showed cholesterol 139, TG 190, HDL 40, and LDL 61.  PAST MEDICAL HISTORY: Past Medical History  Diagnosis Date  . Stroke     3 strokes (2004, 2006, 2007)  . Diabetes mellitus   . Depression   . Hypertension   . ANEMIA, NORMOCYTIC, CHRONIC 11/17/2007    Qualifier: Diagnosis of  By: Cruzita Lederer MD, Salena Saner    . Dementia of frontal lobe type 12/17/2010  . HYPERLIPIDEMIA 04/24/2006    Qualifier: Diagnosis of  By: Cruzita Lederer MD, Salena Saner    . RENAL INSUFFICIENCY, CHRONIC 04/25/2006    Qualifier: Diagnosis of  By: Cruzita Lederer MD, Salena Saner    . GERD (gastroesophageal reflux disease)   . Colon polyp 06/12/12    Colonoscopy and polypectomy by Dr. Deatra Ina revealed tubular adenoma and tubulovillous adenoma    PAST SURGICAL HISTORY: Past Surgical History  Procedure Laterality Date  . Colonoscopy    . Colonoscopy N/A 06/12/2012    Procedure: COLONOSCOPY;  Surgeon: Inda Castle, MD;  Location: [facility];  Service: Endoscopy;  Laterality: N/A;  . Hot hemostasis N/A 06/12/2012    Procedure: HOT HEMOSTASIS (ARGON PLASMA COAGULATION/BICAP);  Surgeon: Inda Castle, MD;  Location: [facility];  Service: Endoscopy;  Laterality: N/A;  MEDICATIONS: Current Outpatient Prescriptions on File Prior to Visit  Medication Sig Dispense Refill  . amitriptyline (ELAVIL) 50 MG tablet Take 1 tablet (50 mg total) by mouth at bedtime. 90 tablet 4  . amLODipine (NORVASC) 10 MG tablet TAKE 1 TABLET BY MOUTH EVERY DAY 90 tablet 2  . atorvastatin (LIPITOR) 40 MG tablet TAKE 1 TABLET BY MOUTH ONCE DAILY 90 tablet 2  . dipyridamole-aspirin (AGGRENOX) 200-25 MG per 12 hr capsule Take 1 capsule by mouth 2 (two) times daily. 180  capsule 4  . losartan (COZAAR) 100 MG tablet TAKE 1 TABLET BY MOUTH DAILY 90 tablet 2  . metFORMIN (GLUCOPHAGE) 500 MG tablet Take 1 tablet (500 mg total) by mouth 2 (two) times daily with a meal. 180 tablet 4  . omeprazole (PRILOSEC) 20 MG capsule TAKE ONE CAPSULE BY MOUTH TWICE DAILY BEFORE A MEAL 60 capsule 0  . omeprazole (PRILOSEC) 20 MG capsule TAKE ONE CAPSULE BY MOUTH TWICE DAILY BEFORE MEALS 60 capsule 5  . risperiDONE (RISPERDAL) 1 MG tablet Take 1 tablet (1 mg total) by mouth at bedtime. 60 tablet 5  . guaiFENesin-dextromethorphan (ROBITUSSIN DM) 100-10 MG/5ML syrup Take 5 mLs by mouth every 4 (four) hours as needed for cough. (Patient not taking: Reported on 01/20/2014) 118 mL 0  . naproxen (NAPROSYN) 500 MG tablet Take 1 tablet (500 mg total) by mouth every 12 (twelve) hours. (Patient not taking: Reported on 01/20/2014) 60 tablet 2   No current facility-administered medications on file prior to visit.    ALLERGIES: Allergies  Allergen Reactions  . Divalproex Sodium     REACTION: Rash    FAMILY HISTORY: Family History  Problem Relation Age of Onset  . Diabetes Maternal Grandmother   . Colon cancer Neg Hx   . Stroke Mother   . Cirrhosis Son   . Diabetes Maternal Grandfather   . Hypertension Maternal Grandfather     SOCIAL HISTORY: History   Social History  . Marital Status: Married    Spouse Name: N/A    Number of Children: N/A  . Years of Education: N/A   Occupational History  . Not on file.   Social History Main Topics  . Smoking status: Former Smoker    Quit date: 04/29/2005  . Smokeless tobacco: Never Used  . Alcohol Use: No  . Drug Use: No  . Sexual Activity: No   Other Topics Concern  . Not on file   Social History Narrative   Patient lives at home with his wife. His son helps keep an eye on him while the wife is at work.    REVIEW OF SYSTEMS: Constitutional: No fevers, chills, or sweats, no generalized fatigue, change in appetite Eyes: No  visual changes, double vision, eye pain Ear, nose and throat: No hearing loss, ear pain, nasal congestion, sore throat Cardiovascular: No chest pain, palpitations Respiratory:  No shortness of breath at rest or with exertion, wheezes GastrointestinaI: No nausea, vomiting, diarrhea, abdominal pain, fecal incontinence Genitourinary:  No dysuria, urinary retention or frequency Musculoskeletal:  No neck pain, back pain Integumentary: No rash, pruritus, skin lesions Neurological: as above Psychiatric: No depression, insomnia, anxiety Endocrine: No palpitations, fatigue, diaphoresis, mood swings, change in appetite, change in weight, increased thirst Hematologic/Lymphatic:  No anemia, purpura, petechiae. Allergic/Immunologic: no itchy/runny eyes, nasal congestion, recent allergic reactions, rashes  PHYSICAL EXAM: Filed Vitals:   01/20/14 1047  BP: 124/68  Pulse: 70  Temp: 98.6 F (37 C)  Resp: 18   General: No  acute distress Head:  Normocephalic/atraumatic Eyes:  fundi unremarkable, without vessel changes, exudates, hemorrhages or papilledema. Neck: supple, no paraspinal tenderness, full range of motion Back: No paraspinal tenderness Heart: regular rate and rhythm Lungs: Clear to auscultation bilaterally. Vascular: No carotid bruits. Neurological Exam: Mental status: alert and oriented to person, place, and time, recent and remote memory intact, fund of knowledge intact, attention and concentration intact, speech fluent and not dysarthric, language intact. Cranial nerves: CN I: not tested CN II: pupils equal, round and reactive to light, visual fields intact, fundi unremarkable, without vessel changes, exudates, hemorrhages or papilledema. CN III, IV, VI:  full range of motion, no nystagmus, no ptosis CN V: facial sensation intact CN VII: upper and lower face symmetric CN VIII: hearing intact CN IX, X: gag intact, uvula midline CN XI: sternocleidomastoid and trapezius muscles  intact CN XII: tongue midline Bulk & Tone: normal, no fasciculations. Motor:  5-/5 left deltoid.  Left pronator drift Sensation:  Reduced pinprick sensation in left upper and lower extremities.  Reduced vibration in left lower extremity. Deep Tendon Reflexes:  3+ in left upper and lower extremities, 2+ on right, toes downgoing Finger to nose testing:  No dysmetria Heel to shin:  No dysmetria Gait:  Mildly reduced stride.  Reduced left armswing.  Unable to walk in tandem. Romberg negative.  IMPRESSION:   PLAN: Management should focus on secondary stroke prevention, which appears to be done. 1.  Aggrenox 2.  Statin (LDL at goal of less than 70) 3.  Blood pressure and diabetes control 4.  We will check another carotid doppler 5.  Follow up as needed Thank you for allowing me to take part in the care of this patient.  Metta Clines, DO  CC: Gilles Chiquito, MD  Julious Oka, MD

## 2014-01-20 NOTE — Patient Instructions (Addendum)
Management should continue focusing on treating stroke risk factors, which is already being done.   1.  Aggrenox 2.  Cholesterol, diabetes and blood pressure control 3.  We will check another carotid doppler 01/21/14 12 :45  [facility]  4.  Call with questions or concerns.

## 2014-01-21 ENCOUNTER — Ambulatory Visit (HOSPITAL_COMMUNITY): Payer: Medicare Other

## 2014-01-24 ENCOUNTER — Ambulatory Visit (HOSPITAL_COMMUNITY): Payer: Medicare Other | Attending: Neurology

## 2014-02-03 ENCOUNTER — Other Ambulatory Visit: Payer: Self-pay | Admitting: Internal Medicine

## 2014-02-03 MED ORDER — ATORVASTATIN CALCIUM 40 MG PO TABS: 40.0000 mg | ORAL_TABLET | Freq: Every day | ORAL | Status: DC

## 2014-02-08 ENCOUNTER — Other Ambulatory Visit: Payer: Self-pay | Admitting: *Deleted

## 2014-02-09 ENCOUNTER — Encounter: Payer: Self-pay | Admitting: *Deleted

## 2014-02-10 ENCOUNTER — Other Ambulatory Visit: Payer: Self-pay | Admitting: *Deleted

## 2014-02-10 DIAGNOSIS — I10 Essential (primary) hypertension: Secondary | ICD-10-CM

## 2014-02-10 MED ORDER — AMLODIPINE BESYLATE 10 MG PO TABS: 10.0000 mg | ORAL_TABLET | Freq: Every day | ORAL | Status: DC

## 2014-02-15 ENCOUNTER — Ambulatory Visit (INDEPENDENT_AMBULATORY_CARE_PROVIDER_SITE_OTHER): Payer: Medicare Other | Admitting: Podiatry

## 2014-02-15 DIAGNOSIS — M79673 Pain in unspecified foot: Secondary | ICD-10-CM

## 2014-02-15 DIAGNOSIS — B351 Tinea unguium: Secondary | ICD-10-CM

## 2014-02-15 NOTE — Patient Instructions (Signed)
Diabetes and Foot Care Diabetes may cause you to have problems because of poor blood supply (circulation) to your feet and legs. This may cause the skin on your feet to become thinner, break easier, and heal more slowly. Your skin may become dry, and the skin may peel and crack. You may also have nerve damage in your legs and feet causing decreased feeling in them. You may not notice minor injuries to your feet that could lead to infections or more serious problems. Taking care of your feet is one of the most important things you can do for yourself.  HOME CARE INSTRUCTIONS  Wear shoes at all times, even in the house. Do not go barefoot. Bare feet are easily injured.  Check your feet daily for blisters, cuts, and redness. If you cannot see the bottom of your feet, use a mirror or ask someone for help.  Wash your feet with warm water (do not use hot water) and mild soap. Then pat your feet and the areas between your toes until they are completely dry. Do not soak your feet as this can dry your skin.  Apply a moisturizing lotion or petroleum jelly (that does not contain alcohol and is unscented) to the skin on your feet and to dry, brittle toenails. Do not apply lotion between your toes.  Trim your toenails straight across. Do not dig under them or around the cuticle. File the edges of your nails with an emery board or nail file.  Do not cut corns or calluses or try to remove them with medicine.  Wear clean socks or stockings every day. Make sure they are not too tight. Do not wear knee-high stockings since they may decrease blood flow to your legs.  Wear shoes that fit properly and have enough cushioning. To break in new shoes, wear them for just a few hours a day. This prevents you from injuring your feet. Always look in your shoes before you put them on to be sure there are no objects inside.  Do not cross your legs. This may decrease the blood flow to your feet.  If you find a minor scrape,  cut, or break in the skin on your feet, keep it and the skin around it clean and dry. These areas may be cleansed with mild soap and water. Do not cleanse the area with peroxide, alcohol, or iodine.  When you remove an adhesive bandage, be sure not to damage the skin around it.  If you have a wound, look at it several times a day to make sure it is healing.  Do not use heating pads or hot water bottles. They may burn your skin. If you have lost feeling in your feet or legs, you may not know it is happening until it is too late.  Make sure your health care provider performs a complete foot exam at least annually or more often if you have foot problems. Report any cuts, sores, or bruises to your health care provider immediately. SEEK MEDICAL CARE IF:   You have an injury that is not healing.  You have cuts or breaks in the skin.  You have an ingrown nail.  You notice redness on your legs or feet.  You feel burning or tingling in your legs or feet.  You have pain or cramps in your legs and feet.  Your legs or feet are numb.  Your feet always feel cold. SEEK IMMEDIATE MEDICAL CARE IF:   There is increasing redness,   swelling, or pain in or around a wound.  There is a red line that goes up your leg.  Pus is coming from a wound.  You develop a fever or as directed by your health care provider.  You notice a bad smell coming from an ulcer or wound. Document Released: 12/29/1999 Document Revised: 09/02/2012 Document Reviewed: 06/09/2012 Exit[employer] Patient Information 2015 [employer]. This information is not intended to replace advice given to you by your health care provider. Make sure you discuss any questions you have with your health care provider.  

## 2014-02-15 NOTE — Progress Notes (Signed)
Subjective:     Patient ID: [name] [name], male   DOB: 02/01/1950, 64 y.o.   MRN: [id number]  HPI patient states I have thick nails on both my feet 1-5 and I cannot cut them and they're painful   Review of Systems     Objective:   Physical Exam Neurovascular status intact with yellow brittle nailbeds 1-5 both feet that are painful    Assessment:     Mycotic nail infection with pain 1-5 both feet    Plan:     Debrided nailbeds 1-5 both feet with no iatrogenic bleeding noted

## 2014-02-17 ENCOUNTER — Other Ambulatory Visit: Payer: Self-pay | Admitting: *Deleted

## 2014-02-17 DIAGNOSIS — I10 Essential (primary) hypertension: Secondary | ICD-10-CM

## 2014-02-17 NOTE — Telephone Encounter (Signed)
Pt has changed pharmacy to mail order

## 2014-02-21 ENCOUNTER — Other Ambulatory Visit: Payer: Medicare Other

## 2014-02-22 MED ORDER — METFORMIN HCL 500 MG PO TABS: 500.0000 mg | ORAL_TABLET | Freq: Two times a day (BID) | ORAL | Status: DC

## 2014-02-22 MED ORDER — OMEPRAZOLE 20 MG PO CPDR: 20.0000 mg | DELAYED_RELEASE_CAPSULE | Freq: Two times a day (BID) | ORAL | Status: DC

## 2014-02-22 MED ORDER — ASPIRIN-DIPYRIDAMOLE ER 25-200 MG PO CP12: 1.0000 | ORAL_CAPSULE | Freq: Two times a day (BID) | ORAL | Status: DC

## 2014-02-22 MED ORDER — LOSARTAN POTASSIUM 100 MG PO TABS: ORAL_TABLET | ORAL | Status: DC

## 2014-02-22 MED ORDER — RISPERIDONE 1 MG PO TABS: 1.0000 mg | ORAL_TABLET | Freq: Two times a day (BID) | ORAL | Status: DC

## 2014-02-22 MED ORDER — ATORVASTATIN CALCIUM 40 MG PO TABS: 40.0000 mg | ORAL_TABLET | Freq: Every day | ORAL | Status: DC

## 2014-02-22 MED ORDER — AMLODIPINE BESYLATE 10 MG PO TABS: 10.0000 mg | ORAL_TABLET | Freq: Every day | ORAL | Status: DC

## 2014-03-10 ENCOUNTER — Encounter: Payer: Self-pay | Admitting: Gastroenterology

## 2014-04-28 ENCOUNTER — Telehealth: Payer: Self-pay | Admitting: *Deleted

## 2014-04-28 NOTE — Telephone Encounter (Signed)
Wife called about pt - question twisted mouth early this AM - now it is normal. All other responses  are normal. Wife states pt shows his feelings though facial expressions.  Suggest ER - at this time no open appt. Decided to make an appt for checkup 05/18/14 1:45PM. Hilda Blades Emery Binz RN 04/28/14 11AM

## 2014-05-13 ENCOUNTER — Other Ambulatory Visit: Payer: Self-pay | Admitting: *Deleted

## 2014-05-16 ENCOUNTER — Telehealth: Payer: Self-pay | Admitting: *Deleted

## 2014-05-16 ENCOUNTER — Ambulatory Visit (INDEPENDENT_AMBULATORY_CARE_PROVIDER_SITE_OTHER): Payer: Medicare Other | Admitting: Gastroenterology

## 2014-05-16 ENCOUNTER — Encounter: Payer: Self-pay | Admitting: Gastroenterology

## 2014-05-16 VITALS — BP 120/72 | HR 88 | Ht 66.0 in | Wt 237.2 lb

## 2014-05-16 DIAGNOSIS — D126 Benign neoplasm of colon, unspecified: Secondary | ICD-10-CM

## 2014-05-16 DIAGNOSIS — Z86010 Personal history of colonic polyps: Secondary | ICD-10-CM

## 2014-05-16 DIAGNOSIS — N182 Chronic kidney disease, stage 2 (mild): Secondary | ICD-10-CM

## 2014-05-16 DIAGNOSIS — E119 Type 2 diabetes mellitus without complications: Secondary | ICD-10-CM

## 2014-05-16 MED ORDER — NA SULFATE-K SULFATE-MG SULF 17.5-3.13-1.6 GM/177ML PO SOLN: 1.0000 | Freq: Once | ORAL | Status: DC

## 2014-05-16 NOTE — Progress Notes (Signed)
_                                                                                                                History of Present Illness:  [name]  is a 64 year old Afro-American male with chronic renal disease, history of CVA, diabetes, here to schedule follow-up colonoscopy.  In 2014 he underwent endoscopic mucosal resection of a tubulovillous adenoma of the right colon.  Complaints including change of bowel habits, abdominal pain or rectal bleeding.  Other medical problems have been stable.  Past Medical History  Diagnosis Date  . Stroke     3 strokes (2004, 2006, 2007)  . Diabetes mellitus   . Depression   . Hypertension   . ANEMIA, NORMOCYTIC, CHRONIC 11/17/2007    Qualifier: Diagnosis of  By: Cruzita Lederer MD, Salena Saner    . Dementia of frontal lobe type 12/17/2010  . HYPERLIPIDEMIA 04/24/2006    Qualifier: Diagnosis of  By: Cruzita Lederer MD, Salena Saner    . RENAL INSUFFICIENCY, CHRONIC 04/25/2006    Qualifier: Diagnosis of  By: Cruzita Lederer MD, Salena Saner    . GERD (gastroesophageal reflux disease)   . Colon polyp 06/12/12    Colonoscopy and polypectomy by Dr. Deatra Ina revealed tubular adenoma and tubulovillous adenoma   Past Surgical History  Procedure Laterality Date  . Colonoscopy    . Colonoscopy N/A 06/12/2012    Procedure: COLONOSCOPY;  Surgeon: Inda Castle, MD;  Location: [facility];  Service: Endoscopy;  Laterality: N/A;  . Hot hemostasis N/A 06/12/2012    Procedure: HOT HEMOSTASIS (ARGON PLASMA COAGULATION/BICAP);  Surgeon: Inda Castle, MD;  Location: [facility];  Service: Endoscopy;  Laterality: N/A;   family history includes Cirrhosis in his son; Diabetes in his maternal grandfather and maternal grandmother; Hypertension in his maternal grandfather; Stroke in his mother. There is no history of Colon cancer. Current Outpatient Prescriptions  Medication Sig Dispense Refill  . amitriptyline (ELAVIL) 50 MG tablet Take 1 tablet (50 mg total) by mouth at bedtime. 90  tablet 4  . amLODipine (NORVASC) 10 MG tablet Take 1 tablet (10 mg total) by mouth daily. 90 tablet 3  . atorvastatin (LIPITOR) 40 MG tablet Take 1 tablet (40 mg total) by mouth daily. 90 tablet 3  . dipyridamole-aspirin (AGGRENOX) 200-25 MG per 12 hr capsule Take 1 capsule by mouth 2 (two) times daily. 180 capsule 4  . guaiFENesin-dextromethorphan (ROBITUSSIN DM) 100-10 MG/5ML syrup Take 5 mLs by mouth every 4 (four) hours as needed for cough. 118 mL 0  . losartan (COZAAR) 100 MG tablet TAKE 1 TABLET BY MOUTH DAILY 90 tablet 2  . metFORMIN (GLUCOPHAGE) 500 MG tablet Take 1 tablet (500 mg total) by mouth 2 (two) times daily with a meal. 180 tablet 4  . naproxen (NAPROSYN) 500 MG tablet Take 1 tablet (500 mg total) by mouth every 12 (twelve) hours. 60 tablet 2  . omeprazole (PRILOSEC) 20 MG capsule Take 1 capsule (20 mg total) by mouth 2 (two) times daily before a meal. 60  capsule 5  . risperiDONE (RISPERDAL) 1 MG tablet Take 1 tablet (1 mg total) by mouth 2 (two) times daily. 180 tablet 3   No current facility-administered medications for this visit.   Allergies as of 05/16/2014 - Review Complete 05/16/2014  Allergen Reaction Noted  . Divalproex sodium  11/17/2008    reports that he quit smoking about 9 years ago. He has never used smokeless tobacco. He reports that he does not drink alcohol or use illicit drugs.   Review of Systems: Pertinent positive and negative review of systems were noted in the above HPI section. All other review of systems were otherwise negative.  Vital signs were reviewed in today's medical record Physical Exam: General: Well developed , well nourished, no acute distress Skin: anicteric Head: Normocephalic and atraumatic Eyes:  sclerae anicteric, EOMI Ears: Normal auditory acuity Mouth: No deformity or lesions Neck: Supple, no masses or thyromegaly Lymph Nodes: no lymphadenopathy Lungs: Clear throughout to auscultation Heart: Regular rate and rhythm; no  murmurs, rubs or bruits Gastroinestinal: Soft, non tender and non distended. No masses, hepatosplenomegaly or hernias noted. Normal Bowel sounds Rectal:deferred Musculoskeletal: Symmetrical with no gross deformities  Skin: No lesions on visible extremities Pulses:  Normal pulses noted Extremities: No clubbing, cyanosis, edema or deformities noted Neurological: Alert oriented x 4, grossly nonfocal Cervical Nodes:  No significant cervical adenopathy Inguinal Nodes: No significant inguinal adenopathy Psychological:  Alert and cooperative. Normal mood and affect  See Assessment and Plan under Problem List

## 2014-05-16 NOTE — Telephone Encounter (Signed)
I will forward this to patient's PCP Dr. Hulen Luster and have her contact you regarding the aggrenox

## 2014-05-16 NOTE — Assessment & Plan Note (Addendum)
One year follow-up was recommended the patient ha had not returned until today.  Plan repeat colonoscopy.  I will check with the patient's PCP whether Aggrenox can be held.

## 2014-05-16 NOTE — Patient Instructions (Signed)
Your Colonoscopy has been scheduled at [facility] on 07/11/2014 Separate instructions have been given We will contact you about holding your aggrenox

## 2014-05-16 NOTE — Telephone Encounter (Signed)
  05/16/2014   RE: [name] [name] DOB: 1950-01-19 MRN: [id number]   Dear  Dr Aldine Contes    We have scheduled the above patient for an endoscopic procedure. Our records show that he is on anticoagulation therapy.   Please advise as to how long the patient may come off his therapy of Aggrenox prior to the procedure, which is scheduled for 07/11/2014.  Please fax back/ or route the completed form to [facility] at [phone].   Sincerely,    Genella Mech

## 2014-05-17 MED ORDER — AMITRIPTYLINE HCL 50 MG PO TABS: 50.0000 mg | ORAL_TABLET | Freq: Every day | ORAL | Status: DC

## 2014-05-18 ENCOUNTER — Ambulatory Visit (INDEPENDENT_AMBULATORY_CARE_PROVIDER_SITE_OTHER): Payer: Medicare Other | Admitting: Internal Medicine

## 2014-05-18 ENCOUNTER — Encounter: Payer: Self-pay | Admitting: Internal Medicine

## 2014-05-18 VITALS — BP 137/77 | HR 93 | Temp 98.5°F | Wt 239.2 lb

## 2014-05-18 DIAGNOSIS — Z9109 Other allergy status, other than to drugs and biological substances: Secondary | ICD-10-CM

## 2014-05-18 DIAGNOSIS — F329 Major depressive disorder, single episode, unspecified: Secondary | ICD-10-CM

## 2014-05-18 DIAGNOSIS — E119 Type 2 diabetes mellitus without complications: Secondary | ICD-10-CM

## 2014-05-18 DIAGNOSIS — F32A Depression, unspecified: Secondary | ICD-10-CM

## 2014-05-18 DIAGNOSIS — Z91048 Other nonmedicinal substance allergy status: Secondary | ICD-10-CM

## 2014-05-18 LAB — POCT GLYCOSYLATED HEMOGLOBIN (HGB A1C): Hemoglobin A1C: 6.5

## 2014-05-18 LAB — GLUCOSE, CAPILLARY: Glucose-Capillary: 65 mg/dL — ABNORMAL LOW (ref 70–99)

## 2014-05-18 MED ORDER — CETIRIZINE HCL 10 MG PO CAPS: 10.0000 mg | ORAL_CAPSULE | Freq: Every day | ORAL | Status: DC

## 2014-05-18 MED ORDER — AMITRIPTYLINE HCL 50 MG PO TABS: 50.0000 mg | ORAL_TABLET | Freq: Every day | ORAL | Status: DC

## 2014-05-18 NOTE — Progress Notes (Signed)
   Subjective:    Patient ID: CHRISS MANNAN, male    DOB: Dec 22, 1950, 64 y.o.   MRN: [id number]  HPI Pt is a 64 y/o male w/PMHx of recurrent CVA on aggrenox, HTN, DM, and HLD who presents to clinic for DM f/u. Also requesting new medication for congestion as muccinex is too expensive.   Pt is scheduled to have a colonoscopy due to polyp found on previous colonoscopy. Spoke with Dr. Tomi Likens w/ neuro and pt can come off of aggrenox 7 days prior to colonoscopy. Will route this note to Dr. Deatra Ina.     Review of Systems  Constitutional: Negative for fever, activity change and appetite change.  HENT: Positive for congestion and rhinorrhea. Negative for postnasal drip and sore throat.   Eyes: Positive for itching.       Tearing   Respiratory: Negative for shortness of breath.   Cardiovascular: Negative for chest pain.  Endocrine: Negative for polydipsia and polyuria.  Neurological: Negative for dizziness and light-headedness.       Objective:   Physical Exam  Constitutional: He appears well-developed and well-nourished.  Cardiovascular: Normal rate and regular rhythm.   Pulmonary/Chest: Effort normal and breath sounds normal.  Abdominal: Soft. Bowel sounds are normal.  Skin: Skin is warm and dry.  Psychiatric:  Slowed mentation           Assessment & Plan:  Please see problem based assessment and plan.

## 2014-05-18 NOTE — Progress Notes (Signed)
Hypoglycemic Event  CBG: 65  Treatment: 15 GM carbohydrate snack  Symptoms: None  Possible Reasons for Event: Inadequate meal intake  Comments/MD notified: Eloise Harman  Remember to initiate Hypoglycemia Order Set & complete

## 2014-05-19 DIAGNOSIS — Z9109 Other allergy status, other than to drugs and biological substances: Secondary | ICD-10-CM | POA: Insufficient documentation

## 2014-05-19 NOTE — Assessment & Plan Note (Signed)
Pt has chronic congestion and has been taking muccinex BID for it. Muccinex is too expensive and he is requesting another decongestant. On further questioning it appears congestion likely due to allergies. Describes watery, itchy eyes, rhinorrhea, and symptoms worsen with the time of the year.   - rx for zyrtec 10mg  daily.

## 2014-05-19 NOTE — Assessment & Plan Note (Addendum)
Lab Results  Component Value Date   HGBA1C 6.5 05/18/2014   HGBA1C 5.9 11/25/2013   HGBA1C 6.3 06/11/2013     Assessment: Diabetes control:  at goal Progress toward A1C goal:   good Comments: non compliant with diabetic diet, likes fast food.   Plan: Medications:  continue current medications Home glucose monitoring: Frequency:  no home glucose monitoring Timing:   Instruction/counseling given: discussed diet Educational resources provided:   Self management tools provided:   Other plans: f/u in 3 months

## 2014-05-23 ENCOUNTER — Ambulatory Visit: Payer: Medicare Other

## 2014-05-23 ENCOUNTER — Telehealth: Payer: Self-pay | Admitting: Gastroenterology

## 2014-05-23 NOTE — Telephone Encounter (Signed)
Informed patient that I would contact him as soon as the suprep samples come in

## 2014-05-24 ENCOUNTER — Ambulatory Visit: Payer: Medicare Other

## 2014-05-30 NOTE — Addendum Note (Signed)
Addended by: Norman Herrlich on: 05/30/2014 02:36 PM   Modules accepted: Level of Service

## 2014-06-02 NOTE — Addendum Note (Signed)
Addended by: Norman Herrlich on: 06/02/2014 11:27 AM   Modules accepted: Level of Service

## 2014-06-08 NOTE — Telephone Encounter (Signed)
CALLED PATIENT TO SEE IF THEY HAVE HEARD FROM THERE PRESCRIBER ABOUT THE AGGRENOX ALSO INFORMED THEM THAT THEY HAVE NOT PICKED UP THE SUPREP KIT YET   LEFT MESSAGE

## 2014-06-19 NOTE — Progress Notes (Signed)
INTERNAL MEDICINE TEACHING ATTENDING ADDENDUM - Aldine Contes, MD: I reviewed and discussed at the time of visit with the resident Dr. Hulen Luster, the patient's medical history, physical examination, diagnosis and results of pertinent tests and treatment and I agree with the patient's care as documented.

## 2014-06-22 NOTE — Telephone Encounter (Signed)
L/M FOR PT TO RETURN MY CALL. HAVE NOT HEARD ABOUT HIS AGGRENOX

## 2014-06-22 NOTE — Telephone Encounter (Signed)
Per my note on 5/4 "Pt is scheduled to have a colonoscopy due to polyp found on previous colonoscopy. Spoke with Dr. Tomi Likens w/ neuro and pt can come off of aggrenox 7 days prior to colonoscopy. Will route this note to Dr. Deatra Ina. " If you could please call Dr. Kelby Fam office with this I would appreciate it as I am currently on night float. Please let me know if you have difficulties getting in touch with Dr. Deatra Ina and I will try as well. Thank you.  Julious Oka, MD Internal Medicine Resident, PGY I [facility] Internal Medicine Program Pager: [phone]

## 2014-06-22 NOTE — Telephone Encounter (Signed)
L/M AT DR Shauna Hugh TRUONGS OFFICE ABOUT PT HOLDING AGGRENOX.

## 2014-06-27 NOTE — Telephone Encounter (Signed)
Patient aware to HOLD Aggrenox 7 days before procedure and wife will come in today to pick up sample PREP kit

## 2014-07-05 ENCOUNTER — Ambulatory Visit (INDEPENDENT_AMBULATORY_CARE_PROVIDER_SITE_OTHER): Payer: Medicare Other | Admitting: Podiatry

## 2014-07-05 ENCOUNTER — Encounter: Payer: Self-pay | Admitting: Podiatry

## 2014-07-05 DIAGNOSIS — E1140 Type 2 diabetes mellitus with diabetic neuropathy, unspecified: Secondary | ICD-10-CM

## 2014-07-05 DIAGNOSIS — B351 Tinea unguium: Secondary | ICD-10-CM | POA: Diagnosis not present

## 2014-07-05 DIAGNOSIS — M79673 Pain in unspecified foot: Secondary | ICD-10-CM

## 2014-07-05 NOTE — Progress Notes (Signed)
Patient ID: [name], male   DOB: 06-Aug-1950, 64 y.o.   MRN: [id number] Complaint:  Visit Type: Patient returns to my office for continued preventative foot care services. Complaint: Patient states" my nails have grown long and thick and become painful to walk and wear shoes" Patient has been diagnosed with DM with neuropathy.Marland Kitchen He presents for preventative foot care services. No changes to ROS  Podiatric Exam: Vascular: dorsalis pedis and posterior tibial pulses are palpable bilateral. Capillary return is immediate. Temperature gradient is WNL. Skin turgor WNL  Sensorium: Diminished  Semmes Weinstein monofilament test. Normal tactile sensation bilaterally. Nail Exam: Pt has thick disfigured discolored nails with subungual debris noted bilateral entire nail hallux through fifth toenails Ulcer Exam: There is no evidence of ulcer or pre-ulcerative changes or infection. Orthopedic Exam: Muscle tone and strength are WNL. No limitations in general ROM. No crepitus or effusions noted. Foot type and digits show no abnormalities. Bony prominences are unremarkable. Skin: No Porokeratosis. No infection or ulcers  Diagnosis:  Tinea unguium, Pain in right toe, pain in left toes  Treatment & Plan Procedures and Treatment: Consent by patient was obtained for treatment procedures. The patient understood the discussion of treatment and procedures well. All questions were answered thoroughly reviewed. Debridement of mycotic and hypertrophic toenails, 1 through 5 bilateral and clearing of subungual debris. No ulceration, no infection noted.  Return Visit-Office Procedure: Patient instructed to return to the office for a follow up visit 3 months for continued evaluation and treatment.

## 2014-07-07 ENCOUNTER — Encounter (HOSPITAL_COMMUNITY): Payer: Self-pay | Admitting: *Deleted

## 2014-07-11 ENCOUNTER — Ambulatory Visit (HOSPITAL_COMMUNITY): Payer: Medicare Other | Admitting: Anesthesiology

## 2014-07-11 ENCOUNTER — Telehealth: Payer: Self-pay | Admitting: Gastroenterology

## 2014-07-11 ENCOUNTER — Ambulatory Visit (HOSPITAL_COMMUNITY)
Admission: RE | Admit: 2014-07-11 | Discharge: 2014-07-11 | Disposition: A | Discharge status: Home / Self Care | Payer: Medicare Other | Source: Ambulatory Visit | Attending: Gastroenterology | Admitting: Gastroenterology

## 2014-07-11 ENCOUNTER — Encounter (HOSPITAL_COMMUNITY): Payer: Self-pay | Admitting: *Deleted

## 2014-07-11 ENCOUNTER — Encounter (HOSPITAL_COMMUNITY)
Admission: RE | Disposition: A | Discharge status: Home / Self Care | Payer: Self-pay | Source: Ambulatory Visit | Attending: Gastroenterology

## 2014-07-11 DIAGNOSIS — K635 Polyp of colon: Secondary | ICD-10-CM | POA: Diagnosis not present

## 2014-07-11 DIAGNOSIS — N189 Chronic kidney disease, unspecified: Secondary | ICD-10-CM | POA: Diagnosis not present

## 2014-07-11 DIAGNOSIS — F329 Major depressive disorder, single episode, unspecified: Secondary | ICD-10-CM | POA: Diagnosis not present

## 2014-07-11 DIAGNOSIS — F0280 Dementia in other diseases classified elsewhere without behavioral disturbance: Secondary | ICD-10-CM | POA: Insufficient documentation

## 2014-07-11 DIAGNOSIS — I129 Hypertensive chronic kidney disease with stage 1 through stage 4 chronic kidney disease, or unspecified chronic kidney disease: Secondary | ICD-10-CM | POA: Diagnosis not present

## 2014-07-11 DIAGNOSIS — Z87891 Personal history of nicotine dependence: Secondary | ICD-10-CM | POA: Insufficient documentation

## 2014-07-11 DIAGNOSIS — Z791 Long term (current) use of non-steroidal anti-inflammatories (NSAID): Secondary | ICD-10-CM | POA: Diagnosis not present

## 2014-07-11 DIAGNOSIS — D649 Anemia, unspecified: Secondary | ICD-10-CM | POA: Diagnosis not present

## 2014-07-11 DIAGNOSIS — D126 Benign neoplasm of colon, unspecified: Secondary | ICD-10-CM

## 2014-07-11 DIAGNOSIS — Z79899 Other long term (current) drug therapy: Secondary | ICD-10-CM | POA: Diagnosis not present

## 2014-07-11 DIAGNOSIS — E785 Hyperlipidemia, unspecified: Secondary | ICD-10-CM | POA: Diagnosis not present

## 2014-07-11 DIAGNOSIS — E119 Type 2 diabetes mellitus without complications: Secondary | ICD-10-CM | POA: Diagnosis not present

## 2014-07-11 DIAGNOSIS — K219 Gastro-esophageal reflux disease without esophagitis: Secondary | ICD-10-CM | POA: Insufficient documentation

## 2014-07-11 DIAGNOSIS — Z8673 Personal history of transient ischemic attack (TIA), and cerebral infarction without residual deficits: Secondary | ICD-10-CM | POA: Diagnosis not present

## 2014-07-11 DIAGNOSIS — Z7902 Long term (current) use of antithrombotics/antiplatelets: Secondary | ICD-10-CM | POA: Insufficient documentation

## 2014-07-11 DIAGNOSIS — R194 Change in bowel habit: Secondary | ICD-10-CM | POA: Diagnosis present

## 2014-07-11 DIAGNOSIS — Z09 Encounter for follow-up examination after completed treatment for conditions other than malignant neoplasm: Secondary | ICD-10-CM | POA: Diagnosis present

## 2014-07-11 DIAGNOSIS — D120 Benign neoplasm of cecum: Secondary | ICD-10-CM | POA: Diagnosis not present

## 2014-07-11 DIAGNOSIS — N182 Chronic kidney disease, stage 2 (mild): Secondary | ICD-10-CM

## 2014-07-11 DIAGNOSIS — G3109 Other frontotemporal dementia: Secondary | ICD-10-CM | POA: Diagnosis not present

## 2014-07-11 DIAGNOSIS — Z86010 Personal history of colonic polyps: Secondary | ICD-10-CM | POA: Insufficient documentation

## 2014-07-11 HISTORY — PX: HOT HEMOSTASIS: SHX5433

## 2014-07-11 HISTORY — PX: COLONOSCOPY WITH PROPOFOL: SHX5780

## 2014-07-11 SURGERY — COLONOSCOPY WITH PROPOFOL
Anesthesia: Monitor Anesthesia Care

## 2014-07-11 MED ORDER — FENTANYL CITRATE (PF) 100 MCG/2ML IJ SOLN
INTRAMUSCULAR | Status: AC
  Filled 2014-07-11: qty 2

## 2014-07-11 MED ORDER — LACTATED RINGERS IV SOLN
INTRAVENOUS | Status: DC
  Administered 2014-07-11: 11:00:00 via INTRAVENOUS

## 2014-07-11 MED ORDER — PROPOFOL INFUSION 10 MG/ML OPTIME
INTRAVENOUS | Status: DC | PRN
  Administered 2014-07-11: 140 ug/kg/min via INTRAVENOUS

## 2014-07-11 MED ORDER — PROPOFOL 10 MG/ML IV BOLUS
INTRAVENOUS | Status: AC
  Filled 2014-07-11: qty 20

## 2014-07-11 MED ORDER — SODIUM CHLORIDE 0.9 % IV SOLN: INTRAVENOUS | Status: DC

## 2014-07-11 SURGICAL SUPPLY — 22 items

## 2014-07-11 NOTE — Anesthesia Preprocedure Evaluation (Addendum)
Anesthesia Evaluation  Patient identified by MRN, date of birth, ID band Patient awake    Reviewed: Allergy & Precautions, H&P , NPO status , Patient's Chart, lab work & pertinent test results  Airway Mallampati: II  TM Distance: >3 FB Neck ROM: full    Dental  (+) Poor Dentition, Dental Advisory Given   Pulmonary neg pulmonary ROS, former smoker,  breath sounds clear to auscultation  Pulmonary exam normal       Cardiovascular Exercise Tolerance: Good hypertension, Pt. on medications Normal cardiovascular examRhythm:regular Rate:Normal     Neuro/Psych Depression CVA x 3dementia CVA, Residual Symptoms negative neurological ROS  negative psych ROS   GI/Hepatic negative GI ROS, Neg liver ROS, GERD-  Medicated and Controlled,  Endo/Other  diabetes, Well Controlled, Type 2, Oral Hypoglycemic Agents  Renal/GU Renal disease  negative genitourinary   Musculoskeletal   Abdominal   Peds  Hematology negative hematology ROS (+)   Anesthesia Other Findings   Reproductive/Obstetrics negative OB ROS                            Anesthesia Physical Anesthesia Plan  ASA: III  Anesthesia Plan: MAC   Post-op Pain Management:    Induction:   Airway Management Planned:   Additional Equipment:   Intra-op Plan:   Post-operative Plan:   Informed Consent: I have reviewed the patients History and Physical, chart, labs and discussed the procedure including the risks, benefits and alternatives for the proposed anesthesia with the patient or authorized representative who has indicated his/her understanding and acceptance.   Dental Advisory Given  Plan Discussed with: CRNA and Surgeon  Anesthesia Plan Comments:         Anesthesia Quick Evaluation

## 2014-07-11 NOTE — H&P (Signed)
History of Present Illness: [name] is a 64 year old Afro-American male with chronic renal disease, history of CVA, diabetes, here to schedule follow-up colonoscopy. In 2014 he underwent endoscopic mucosal resection of a tubulovillous adenoma of the right colon. Complaints including change of bowel habits, abdominal pain or rectal bleeding. Other medical problems have been stable.  Past Medical History  Diagnosis Date  . Stroke     3 strokes (2004, 2006, 2007)  . Diabetes mellitus   . Depression   . Hypertension   . ANEMIA, NORMOCYTIC, CHRONIC 11/17/2007    Qualifier: Diagnosis of By: Cruzita Lederer MD, Salena Saner   . Dementia of frontal lobe type 12/17/2010  . HYPERLIPIDEMIA 04/24/2006    Qualifier: Diagnosis of By: Cruzita Lederer MD, Salena Saner   . RENAL INSUFFICIENCY, CHRONIC 04/25/2006    Qualifier: Diagnosis of By: Cruzita Lederer MD, Salena Saner   . GERD (gastroesophageal reflux disease)   . Colon polyp 06/12/12    Colonoscopy and polypectomy by Dr. Deatra Ina revealed tubular adenoma and tubulovillous adenoma   Past Surgical History  Procedure Laterality Date  . Colonoscopy    . Colonoscopy N/A 06/12/2012    Procedure: COLONOSCOPY; Surgeon: Inda Castle, MD; Location: [facility]; Service: Endoscopy; Laterality: N/A;  . Hot hemostasis N/A 06/12/2012    Procedure: HOT HEMOSTASIS (ARGON PLASMA COAGULATION/BICAP); Surgeon: Inda Castle, MD; Location: [facility]; Service: Endoscopy; Laterality: N/A;   family history includes Cirrhosis in his son; Diabetes in his maternal grandfather and maternal grandmother; Hypertension in his maternal grandfather; Stroke in his mother. There is no history of Colon cancer. Current Outpatient Prescriptions  Medication Sig Dispense Refill  . amitriptyline (ELAVIL) 50 MG tablet Take 1 tablet (50 mg  total) by mouth at bedtime. 90 tablet 4  . amLODipine (NORVASC) 10 MG tablet Take 1 tablet (10 mg total) by mouth daily. 90 tablet 3  . atorvastatin (LIPITOR) 40 MG tablet Take 1 tablet (40 mg total) by mouth daily. 90 tablet 3  . dipyridamole-aspirin (AGGRENOX) 200-25 MG per 12 hr capsule Take 1 capsule by mouth 2 (two) times daily. 180 capsule 4  . guaiFENesin-dextromethorphan (ROBITUSSIN DM) 100-10 MG/5ML syrup Take 5 mLs by mouth every 4 (four) hours as needed for cough. 118 mL 0  . losartan (COZAAR) 100 MG tablet TAKE 1 TABLET BY MOUTH DAILY 90 tablet 2  . metFORMIN (GLUCOPHAGE) 500 MG tablet Take 1 tablet (500 mg total) by mouth 2 (two) times daily with a meal. 180 tablet 4  . naproxen (NAPROSYN) 500 MG tablet Take 1 tablet (500 mg total) by mouth every 12 (twelve) hours. 60 tablet 2  . omeprazole (PRILOSEC) 20 MG capsule Take 1 capsule (20 mg total) by mouth 2 (two) times daily before a meal. 60 capsule 5  . risperiDONE (RISPERDAL) 1 MG tablet Take 1 tablet (1 mg total) by mouth 2 (two) times daily. 180 tablet 3   No current facility-administered medications for this visit.   Allergies as of 05/16/2014 - Review Complete 05/16/2014  Allergen Reaction Noted  . Divalproex sodium  11/17/2008    reports that he quit smoking about 9 years ago. He has never used smokeless tobacco. He reports that he does not drink alcohol or use illicit drugs.   Review of Systems: Pertinent positive and negative review of systems were noted in the above HPI section. All other review of systems were otherwise negative.  Vital signs were reviewed in today's medical record Physical Exam: General: Well developed , well nourished,  no acute distress Skin: anicteric Head: Normocephalic and atraumatic Eyes: sclerae anicteric, EOMI Ears: Normal auditory acuity Mouth: No deformity or lesions Neck: Supple, no masses or thyromegaly Lymph Nodes: no  lymphadenopathy Lungs: Clear throughout to auscultation Heart: Regular rate and rhythm; no murmurs, rubs or bruits Gastroinestinal: Soft, non tender and non distended. No masses, hepatosplenomegaly or hernias noted. Normal Bowel sounds Rectal:deferred Musculoskeletal: Symmetrical with no gross deformities  Skin: No lesions on visible extremities Pulses: Normal pulses noted Extremities: No clubbing, cyanosis, edema or deformities noted Neurological: Alert oriented x 4, grossly nonfocal Cervical Nodes: No significant cervical adenopathy Inguinal Nodes: No significant inguinal adenopathy Psychological: Alert and cooperative. Normal mood and affect  See Assessment and Plan under Problem List                  Benign neoplasm of colon - Inda Castle, MD at 05/16/2014 3:38 PM     Status: Alison Stalling Related Problem: Benign neoplasm of colon   Expand All Collapse All   One year follow-up was recommended the patient ha had not returned until today. Plan repeat colonoscopy. I will check with the patient's PCP whether Aggrenox can be held.

## 2014-07-11 NOTE — Transfer of Care (Signed)
Immediate Anesthesia Transfer of Care Note  Patient: [name] [name]  Procedure(s) Performed: Procedure(s): COLONOSCOPY WITH PROPOFOL (N/A) HOT HEMOSTASIS (ARGON PLASMA COAGULATION/BICAP) (N/A)  Patient Location: Endoscopy Unit  Anesthesia Type:MAC  Level of Consciousness: sedated  Airway & Oxygen Therapy: Patient Spontanous Breathing and Patient connected to face mask oxygen  Post-op Assessment: Report given to RN and Post -op Vital signs reviewed and stable  Post vital signs: Reviewed and stable  Last Vitals:  Filed Vitals:   07/11/14 1034  BP: 174/96  Pulse: 87  Temp: 36.9 C  Resp: 10    Complications: No apparent anesthesia complications

## 2014-07-11 NOTE — Telephone Encounter (Signed)
Patient had his procedure today. Mrs. Dise reports that his has tolerated food and seems to be feeling well. She will resume his regular medications.

## 2014-07-11 NOTE — Discharge Instructions (Signed)
Colon Polyps Polyps are lumps of extra tissue growing inside the body. Polyps can grow in the large intestine (colon). Most colon polyps are noncancerous (benign). However, some colon polyps can become cancerous over time. Polyps that are larger than a pea may be harmful. To be safe, caregivers remove and test all polyps. CAUSES  Polyps form when mutations in the genes cause your cells to grow and divide even though no more tissue is needed. RISK FACTORS There are a number of risk factors that can increase your chances of getting colon polyps. They include:  Being older than 50 years.  Family history of colon polyps or colon cancer.  Long-term colon diseases, such as colitis or Crohn disease.  Being overweight.  Smoking.  Being inactive.  Drinking too much alcohol. SYMPTOMS  Most small polyps do not cause symptoms. If symptoms are present, they may include:  Blood in the stool. The stool may look dark red or black.  Constipation or diarrhea that lasts longer than 1 week. DIAGNOSIS People often do not know they have polyps until their caregiver finds them during a regular checkup. Your caregiver can use 4 tests to check for polyps:  Digital rectal exam. The caregiver wears gloves and feels inside the rectum. This test would find polyps only in the rectum.  Barium enema. The caregiver puts a liquid called barium into your rectum before taking X-rays of your colon. Barium makes your colon look white. Polyps are dark, so they are easy to see in the X-ray pictures.  Sigmoidoscopy. A thin, flexible tube (sigmoidoscope) is placed into your rectum. The sigmoidoscope has a light and tiny camera in it. The caregiver uses the sigmoidoscope to look at the last third of your colon.  Colonoscopy. This test is like sigmoidoscopy, but the caregiver looks at the entire colon. This is the most common method for finding and removing polyps. TREATMENT  Any polyps will be removed during a  sigmoidoscopy or colonoscopy. The polyps are then tested for cancer. PREVENTION  To help lower your risk of getting more colon polyps:  Eat plenty of fruits and vegetables. Avoid eating fatty foods.  Do not smoke.  Avoid drinking alcohol.  Exercise every day.  Lose weight if recommended by your caregiver.  Eat plenty of calcium and folate. Foods that are rich in calcium include milk, cheese, and broccoli. Foods that are rich in folate include chickpeas, kidney beans, and spinach. HOME CARE INSTRUCTIONS Keep all follow-up appointments as directed by your caregiver. You may need periodic exams to check for polyps. SEEK MEDICAL CARE IF: You notice bleeding during a bowel movement. Document Released: 09/27/2003 Document Revised: 03/25/2011 Document Reviewed: 03/12/2011 [facility] Patient Information 2015 [place], [place]. This information is not intended to replace advice given to you by your health care provider. Make sure you discuss any questions you have with your health care provider. Colonoscopy, Care After Refer to this sheet in the next few weeks. These instructions provide you with information on caring for yourself after your procedure. Your health care provider may also give you more specific instructions. Your treatment has been planned according to current medical practices, but problems sometimes occur. Call your health care provider if you have any problems or questions after your procedure. WHAT TO EXPECT AFTER THE PROCEDURE  After your procedure, it is typical to have the following:  A small amount of blood in your stool.  Moderate amounts of gas and mild abdominal cramping or bloating. [place]  TO EXPECT AFTER THE PROCEDURE   After your procedure, it is typical to have the following:   A small amount of blood in your stool.   Moderate amounts of gas and mild abdominal cramping or bloating.  HOME CARE INSTRUCTIONS   Do not drive, operate machinery, or sign important documents for 24 hours.   You may shower and resume your regular physical activities, but move at a slower pace for the first 24 hours.   Take frequent rest periods for the first 24 hours.   Walk around or put a warm  pack on your abdomen to help reduce abdominal cramping and bloating.   Drink enough fluids to keep your urine clear or pale yellow.   You may resume your normal diet as instructed by your health care provider. Avoid heavy or fried foods that are hard to digest.   Avoid drinking alcohol for 24 hours or as instructed by your health care provider.   Only take over-the-counter or prescription medicines as directed by your health care provider.   If a tissue sample (biopsy) was taken during your procedure:   Do not take aspirin or blood thinners for 7 days, or as instructed by your health care provider.   Do not drink alcohol for 7 days, or as instructed by your health care provider.   Eat soft foods for the first 24 hours.  SEEK MEDICAL CARE IF:  You have persistent spotting of blood in your stool 2-3 days after the procedure.  SEEK IMMEDIATE MEDICAL CARE IF:   You have more than a small spotting of blood in your stool.   You pass large blood clots in your stool.   Your abdomen is swollen (distended).   You have nausea or vomiting.   You have a fever.   You have increasing abdominal pain that is not relieved with medicine.  Document Released: 08/15/2003 Document Revised: 10/21/2012 Document Reviewed: 09/07/2012  [employer] Patient Information 2015 [employer]. This information is not intended to replace advice given to you by your health care provider. Make sure you discuss any questions you have with your health care provider.

## 2014-07-11 NOTE — Op Note (Signed)
[facility] [place], [id number]   COLONOSCOPY PROCEDURE REPORT     EXAM DATE: 07/11/2014  PATIENT NAME:      [name] [name], [name] [name]           MR #:      [id number]  BIRTHDATE:       03/27/1950      VISIT #:     [phone]  ATTENDING:     Inda Castle, MD     STATUS:     outpatient ASSISTANT:      Jeb Levering and Cristopher Estimable  INDICATIONS:  The patient is a 64 yr old male here for a colonoscopy due to PROCEDURE PERFORMED:     Colonoscopy with cold biopsy polypectomy Colonoscopy with snare polypectomy Submucosal injection, any substance MEDICATIONS:     Monitored anesthesia care ESTIMATED BLOOD LOSS:     None  CONSENT: The patient understands the risks and benefits of the procedure and understands that these risks include, but are not limited to: sedation, allergic reaction, infection, perforation and/or bleeding. Alternative means of evaluation and treatment include, among others: physical exam, x-rays, and/or surgical intervention. The patient elects to proceed with this endoscopic procedure.  DESCRIPTION OF PROCEDURE: During intra-op preparation period all mechanical & medical equipment was checked for proper function. Hand hygiene and appropriate measures for infection prevention was taken. After the risks, benefits and alternatives of the procedure were thoroughly explained, Informed consent was verified, confirmed and timeout was successfully executed by the treatment team. A digital exam revealed no abnormalities of the rectum. The Pentax Adult Colonscope [id number] endoscope was introduced through the anus and advanced to the cecum, which was identified by both the appendix and ileocecal valve. (Suprep was used) excellent. The instrument was then slowly withdrawn as the colon was fully examined.Estimated blood loss is zero unless otherwise noted in this procedure report.    COLON FINDINGS: A sessile polyp measuring 4 mm in size  was found at the cecum.  A polypectomy was performed with a cold snare.  The resection was complete, the polyp tissue was completely retrieved and sent to histology.   A sessile polyp measuring 18 mm in size was found at the cecum.  A saline Injection 4cc was given to lift the mucosal wall.  A polypectomy was performed using snare cautery. The resection was complete, the polyp tissue was completely retrieved and sent to histology.   A sessile polyp measuring 2 mm in size was found at the cecum.  A polypectomy was performed with cold forceps. Retroflexed views revealed no abnormalities. The scope was then completely withdrawn from the patient and the procedure terminated. SCOPE WITHDRAWAL TIME:    ADVERSE EVENTS:      There were no immediate complications.  IMPRESSIONS:     1.  Sessile polyp was found at the cecum; polypectomy was performed with a cold snare 2.  Sessile polyp was found at the cecum; saline was given to lift the mucosal wall.; polypectomy was performed using snare cautery 3.  Sessile polyp was found at the cecum; polypectomy was performed with cold forceps  RECOMMENDATIONS:     Repeat Colonoscopy in 3 years. RECALL:  _____________________________ Inda Castle, MD eSigned:  Inda Castle, MD 07/11/2014 11:55 AM   cc:  Fransisca Kaufmann, MD   CPT CODES: ICD CODES:  The ICD and CPT codes recommended by this software are interpretations from the data that the clinical staff has captured with the  software.  The verification of the translation of this report to the ICD and CPT codes and modifiers is the sole responsibility of the health care institution and practicing physician where this report was generated.  [employer] will not be held responsible for the validity of the ICD and CPT codes included on this report.  AMA assumes no liability for data contained or not contained herein. CPT is a Designer, television/film set of the [employer].   PATIENT NAME:  [name] [name], [name] [name] MR#: [id number]

## 2014-07-11 NOTE — Anesthesia Postprocedure Evaluation (Signed)
  Anesthesia Post-op Note  Patient: [name] [name]  Procedure(s) Performed: Procedure(s) (LRB): COLONOSCOPY WITH PROPOFOL (N/A) HOT HEMOSTASIS (ARGON PLASMA COAGULATION/BICAP) (N/A)  Patient Location: PACU  Anesthesia Type: MAC  Level of Consciousness: awake and alert   Airway and Oxygen Therapy: Patient Spontanous Breathing  Post-op Pain: mild  Post-op Assessment: Post-op Vital signs reviewed, Patient's Cardiovascular Status Stable, Respiratory Function Stable, Patent Airway and No signs of Nausea or vomiting  Last Vitals:  Filed Vitals:   07/11/14 1220  BP: 161/89  Pulse: 80  Temp:   Resp: 17    Post-op Vital Signs: stable   Complications: No apparent anesthesia complications

## 2014-07-12 ENCOUNTER — Encounter (HOSPITAL_COMMUNITY): Payer: Self-pay | Admitting: Gastroenterology

## 2014-07-12 LAB — GLUCOSE, CAPILLARY: GLUCOSE-CAPILLARY: 96 mg/dL (ref 65–99)

## 2014-07-14 ENCOUNTER — Encounter: Payer: Self-pay | Admitting: Gastroenterology

## 2014-07-18 ENCOUNTER — Other Ambulatory Visit: Payer: Self-pay | Admitting: Internal Medicine
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
## 2014-07-26 ENCOUNTER — Other Ambulatory Visit: Payer: Self-pay | Admitting: Internal Medicine

## 2014-07-26 NOTE — Telephone Encounter (Signed)
Patient calling asking for an additional medicaiton than what the pharmacy is sending. Mail in order pharmacy

## 2014-08-18 ENCOUNTER — Other Ambulatory Visit: Payer: Self-pay | Admitting: *Deleted

## 2014-08-18 NOTE — Telephone Encounter (Signed)
Call from patient's wife stating that pat has not received his refill on Cetirizine.  Needs Prior Authorization.  Stated that Pharmacy has sent over request.  Will check on request and attempt prior authorization of.  Sander Nephew, RN 08/18/2014 9:46 AM

## 2014-08-19 NOTE — Telephone Encounter (Signed)
A user error has taken place: encounter opened in error, closed for administrative reasons.

## 2014-08-29 ENCOUNTER — Telehealth: Payer: Self-pay | Admitting: *Deleted

## 2014-08-29 NOTE — Telephone Encounter (Signed)
Call to patient's spouse about Prior Authorization for Cetirizine.  Over the counter med. Cannot not get PA for.  Suggested to wife to get OTC at [facility] for 88 cents.  Wife to go to [facility] to get medication for patient. Will also look for Mucinex over the counter. Sander Nephew, RN 08/29/2014 4:59 PM

## 2014-08-29 NOTE — Telephone Encounter (Signed)
Left message on home phone ID recording - [place] state insurance denied cetirizine. States you can purchase #14 tab of cetirizine for $0.88. Hilda Blades Tari Lecount RN 08/29/14 4:30PM

## 2014-08-29 NOTE — Telephone Encounter (Signed)
Pt is calling about this medication prior authorization.

## 2014-10-13 ENCOUNTER — Ambulatory Visit (INDEPENDENT_AMBULATORY_CARE_PROVIDER_SITE_OTHER): Payer: Medicare Other | Admitting: Podiatry

## 2014-10-13 ENCOUNTER — Encounter: Payer: Self-pay | Admitting: Podiatry

## 2014-10-13 DIAGNOSIS — M79673 Pain in unspecified foot: Secondary | ICD-10-CM

## 2014-10-13 DIAGNOSIS — B351 Tinea unguium: Secondary | ICD-10-CM

## 2014-10-13 DIAGNOSIS — E1140 Type 2 diabetes mellitus with diabetic neuropathy, unspecified: Secondary | ICD-10-CM

## 2014-10-13 NOTE — Progress Notes (Signed)
Patient ID: [name] [name], male   DOB: 11/17/1950, 64 y.o.   MRN: [id number] Complaint:  Visit Type: Patient returns to my office for continued preventative foot care services. Complaint: Patient states" my nails have grown long and thick and become painful to walk and wear shoes" Patient has been diagnosed with DM with neuropathy.Marland Kitchen He presents for preventative foot care services. No changes to ROS  Podiatric Exam: Vascular: dorsalis pedis and posterior tibial pulses are palpable bilateral. Capillary return is immediate. Temperature gradient is WNL. Skin turgor WNL  Sensorium: Diminished  Semmes Weinstein monofilament test. Normal tactile sensation bilaterally. Nail Exam: Pt has thick disfigured discolored nails with subungual debris noted bilateral entire nail hallux through fifth toenails Ulcer Exam: There is no evidence of ulcer or pre-ulcerative changes or infection. Orthopedic Exam: Muscle tone and strength are WNL. No limitations in general ROM. No crepitus or effusions noted. Foot type and digits show no abnormalities. Bony prominences are unremarkable. Skin: No Porokeratosis. No infection or ulcers  Diagnosis:  Tinea unguium, Pain in right toe, pain in left toes  Treatment & Plan Procedures and Treatment: Consent by patient was obtained for treatment procedures. The patient understood the discussion of treatment and procedures well. All questions were answered thoroughly reviewed. Debridement of mycotic and hypertrophic toenails, 1 through 5 bilateral and clearing of subungual debris. No ulceration, no infection noted.  Return Visit-Office Procedure: Patient instructed to return to the office for a follow up visit 3 months for continued evaluation and treatment.

## 2014-10-19 ENCOUNTER — Encounter: Payer: Self-pay | Admitting: Internal Medicine

## 2014-10-19 ENCOUNTER — Ambulatory Visit (INDEPENDENT_AMBULATORY_CARE_PROVIDER_SITE_OTHER): Payer: Medicare Other | Admitting: Internal Medicine

## 2014-10-19 VITALS — BP 114/85 | HR 105 | Temp 98.1°F | Ht 61.0 in | Wt 253.6 lb

## 2014-10-19 DIAGNOSIS — Z23 Encounter for immunization: Secondary | ICD-10-CM | POA: Diagnosis not present

## 2014-10-19 DIAGNOSIS — E119 Type 2 diabetes mellitus without complications: Secondary | ICD-10-CM | POA: Diagnosis not present

## 2014-10-19 DIAGNOSIS — Z0000 Encounter for general adult medical examination without abnormal findings: Secondary | ICD-10-CM

## 2014-10-19 DIAGNOSIS — D649 Anemia, unspecified: Secondary | ICD-10-CM | POA: Diagnosis not present

## 2014-10-19 LAB — GLUCOSE, CAPILLARY: GLUCOSE-CAPILLARY: 105 mg/dL — AB (ref 65–99)

## 2014-10-19 LAB — POCT GLYCOSYLATED HEMOGLOBIN (HGB A1C): HEMOGLOBIN A1C: 6.5

## 2014-10-19 NOTE — Progress Notes (Signed)
   Subjective:    Patient ID: [name] [name], male    DOB: 1950/01/28, 64 y.o.   MRN: [id number]  HPI 64 y/o M w/ PMHx of HTN, GERD, DM2, dementia, CKD stage 2, normocytic anemia, allergies, and HLD who presents to clinic for 5 month f/u and medication refills. He and his wife do not remember the medications they needed refilled and states the pharmacy automatically sends in refill requests for them. Reviewed med list and problem list w/ patient. He has no complaints today. Please see problem list for further details.      Review of Systems  Constitutional: Negative for fever, activity change and fatigue.  HENT: Negative for congestion.   Eyes: Negative for visual disturbance (eye exam last year with increase in strength of prescription eye glasses).  Respiratory: Negative for cough and shortness of breath.   Cardiovascular: Negative for chest pain.  Gastrointestinal: Negative for abdominal pain.  Endocrine: Positive for polyuria (gets up 2x a night to urinate). Negative for polydipsia.  Musculoskeletal: Negative for back pain.  Allergic/Immunologic: Positive for environmental allergies.       Objective:   Physical Exam  Constitutional: He appears well-developed and well-nourished. No distress.  HENT:  Head: Atraumatic.  Cardiovascular: Normal rate, regular rhythm and normal heart sounds.   No murmur heard. Pulmonary/Chest: Effort normal and breath sounds normal. No respiratory distress. He has no wheezes. He has no rales.  Abdominal: Soft. Bowel sounds are normal. He exhibits no distension. There is no tenderness.  Neurological: He is alert.  Skin: Skin is warm and dry.          Assessment & Plan:  Please see problem based assessment and plan.

## 2014-10-19 NOTE — Assessment & Plan Note (Addendum)
Flu shot today. Reviewed colonoscopy report on 06/2014 during which he had 3 precancerous polyps removed. Repeat colonoscopy in 3 years.

## 2014-10-19 NOTE — Assessment & Plan Note (Signed)
Lab Results  Component Value Date   HGBA1C 6.5 10/19/2014   HGBA1C 6.5 05/18/2014   HGBA1C 5.9 11/25/2013     Assessment: Diabetes control:  controlled Progress toward A1C goal:   at goal Comments: on metformin 500mg  BID  Plan: Medications:  continue current medications Home glucose monitoring: Frequency:   Timing:   Instruction/counseling given: reminded to get eye exam and discussed foot care Educational resources provided:   Self management tools provided:   Other plans: foot exam today. F/u in 6 months.

## 2014-10-19 NOTE — Assessment & Plan Note (Addendum)
Hx of anemia w/ MCV as low as 74. He is asymptomatic. Recent colonoscopy that was negative except for 3 polyps that were removed. Checking CBC and ferritin today for iron deficiency anemia. Could also be a thalasemia minor trait.

## 2014-10-20 NOTE — Progress Notes (Signed)
Medicine attending: Medical history, presenting problems, physical findings, and medications, reviewed with Dr Diana Truong and I concur with her evaluation and management plan. 

## 2015-01-05 ENCOUNTER — Ambulatory Visit (INDEPENDENT_AMBULATORY_CARE_PROVIDER_SITE_OTHER): Payer: Medicare Other | Admitting: Podiatry

## 2015-01-05 ENCOUNTER — Encounter: Payer: Self-pay | Admitting: Podiatry

## 2015-01-05 DIAGNOSIS — B351 Tinea unguium: Secondary | ICD-10-CM | POA: Diagnosis not present

## 2015-01-05 DIAGNOSIS — M79673 Pain in unspecified foot: Secondary | ICD-10-CM

## 2015-01-05 NOTE — Progress Notes (Signed)
Patient ID: [name] [name], male   DOB: 11-24-1950, 64 y.o.   MRN: [id number] Complaint:  Visit Type: Patient returns to my office for continued preventative foot care services. Complaint: Patient states" my nails have grown long and thick and become painful to walk and wear shoes" Patient has been diagnosed with DM with neuropathy.Marland Kitchen He presents for preventative foot care services. No changes to ROS  Podiatric Exam: Vascular: dorsalis pedis and posterior tibial pulses are palpable bilateral. Capillary return is immediate. Temperature gradient is WNL. Skin turgor WNL  Sensorium: Diminished  Semmes Weinstein monofilament test. Normal tactile sensation bilaterally. Nail Exam: Pt has thick disfigured discolored nails with subungual debris noted bilateral entire nail hallux through fifth toenails Ulcer Exam: There is no evidence of ulcer or pre-ulcerative changes or infection. Orthopedic Exam: Muscle tone and strength are WNL. No limitations in general ROM. No crepitus or effusions noted. Foot type and digits show no abnormalities. Bony prominences are unremarkable. Skin: No Porokeratosis. No infection or ulcers  Diagnosis:  Tinea unguium, Pain in right toe, pain in left toes  Treatment & Plan Procedures and Treatment: Consent by patient was obtained for treatment procedures. The patient understood the discussion of treatment and procedures well. All questions were answered thoroughly reviewed. Debridement of mycotic and hypertrophic toenails, 1 through 5 bilateral and clearing of subungual debris. No ulceration, no infection noted.  Return Visit-Office Procedure: Patient instructed to return to the office for a follow up visit 3 months for continued evaluation and treatment.   Gardiner Barefoot DPM

## 2015-01-16 ENCOUNTER — Other Ambulatory Visit: Payer: Self-pay | Admitting: Internal Medicine

## 2015-03-06 ENCOUNTER — Other Ambulatory Visit: Payer: Self-pay | Admitting: Internal Medicine

## 2015-03-16 ENCOUNTER — Encounter: Payer: Self-pay | Admitting: Internal Medicine

## 2015-03-16 ENCOUNTER — Ambulatory Visit (INDEPENDENT_AMBULATORY_CARE_PROVIDER_SITE_OTHER): Payer: Medicare Other | Admitting: Internal Medicine

## 2015-03-16 VITALS — BP 140/75 | HR 107 | Temp 97.9°F | Resp 22 | Ht 62.0 in | Wt 260.9 lb

## 2015-03-16 DIAGNOSIS — R531 Weakness: Secondary | ICD-10-CM | POA: Diagnosis not present

## 2015-03-16 LAB — CBC WITH DIFFERENTIAL/PLATELET
Basophils Absolute: 0 10*3/uL (ref 0.0–0.1)
Basophils Relative: 0 %
Eosinophils Absolute: 0 10*3/uL (ref 0.0–0.7)
Eosinophils Relative: 0 %
HCT: 36.3 % — ABNORMAL LOW (ref 39.0–52.0)
HEMOGLOBIN: 11.6 g/dL — AB (ref 13.0–17.0)
LYMPHS ABS: 1.7 10*3/uL (ref 0.7–4.0)
Lymphocytes Relative: 32 %
MCH: 24.5 pg — ABNORMAL LOW (ref 26.0–34.0)
MCHC: 32 g/dL (ref 30.0–36.0)
MCV: 76.6 fL — AB (ref 78.0–100.0)
Monocytes Absolute: 0.7 10*3/uL (ref 0.1–1.0)
Monocytes Relative: 14 %
NEUTROS PCT: 54 %
Neutro Abs: 2.9 10*3/uL (ref 1.7–7.7)
Platelets: 159 10*3/uL (ref 150–400)
RBC: 4.74 MIL/uL (ref 4.22–5.81)
RDW: 15.9 % — ABNORMAL HIGH (ref 11.5–15.5)
WBC: 5.4 10*3/uL (ref 4.0–10.5)

## 2015-03-16 LAB — BASIC METABOLIC PANEL
ANION GAP: 11 (ref 5–15)
BUN: 17 mg/dL (ref 6–20)
CHLORIDE: 111 mmol/L (ref 101–111)
CO2: 23 mmol/L (ref 22–32)
Calcium: 9.4 mg/dL (ref 8.9–10.3)
Creatinine, Ser: 1.95 mg/dL — ABNORMAL HIGH (ref 0.61–1.24)
GFR calc Af Amer: 40 mL/min — ABNORMAL LOW (ref >60)
GFR calc non Af Amer: 35 mL/min — ABNORMAL LOW (ref >60)
Glucose, Bld: 136 mg/dL — ABNORMAL HIGH (ref 65–99)
Potassium: 4.2 mmol/L (ref 3.5–5.1)
SODIUM: 145 mmol/L (ref 135–145)

## 2015-03-16 LAB — GLUCOSE, CAPILLARY: Glucose-Capillary: 123 mg/dL — ABNORMAL HIGH (ref 65–99)

## 2015-03-16 MED ORDER — RISPERIDONE 0.5 MG PO TABS: 0.5000 mg | ORAL_TABLET | Freq: Two times a day (BID) | ORAL | Status: DC

## 2015-03-16 NOTE — Patient Instructions (Addendum)
Your weakness/dizziness may be from different causes. You need to drink more water to keep yourself hydrated.   You also need to cut down on your medications which can cause some of this: we will reduce your dose of risperidone and also discontinue the cough medications.   Follow up in 1 week with [place].

## 2015-03-16 NOTE — Progress Notes (Signed)
   Subjective:    Patient ID: [name] [name], male    DOB: [birth year], 65 y.o.   MRN: [id number]  HPI  65 yo male with hx of HTN, GERd, well controlled DM II, HLD, CVAx3 in the past, presents with generalized weakness.  Generalized weakness started 2 days ago. No fever/chills, runny nose, dysuria, diarrhea. Has some cough and SOB with exertion. Has not been drinking much water. He feels so weak that he could not walk to the bathroom, had bowel and bladder accident in bed. No focal weakness. Has dizziness upon standing but no vertigo. Noticed some wheezing as well. Has some residual left sided weakness from previous stroke but denies any new weakness/ numbness/ tingling.    Review of Systems  Constitutional: Positive for fatigue. Negative for fever and chills.  HENT: Positive for congestion. Negative for sore throat and trouble swallowing.   Eyes: Negative for visual disturbance.  Respiratory: Positive for cough, shortness of breath and wheezing. Negative for chest tightness.   Cardiovascular: Negative for chest pain, palpitations and leg swelling.  Gastrointestinal: Negative for nausea, vomiting, abdominal pain, diarrhea and abdominal distention.  Endocrine: Negative for polyuria.  Genitourinary: Negative for dysuria and hematuria.  Musculoskeletal: Negative for back pain, joint swelling and arthralgias.  Skin: Negative.   Neurological: Positive for weakness. Negative for dizziness, tremors, seizures, syncope, facial asymmetry, light-headedness, numbness and headaches.  Hematological: Negative.   Psychiatric/Behavioral: Negative.        Objective:   Physical Exam  Constitutional: He is oriented to person, place, and time. He appears well-developed and well-nourished.  Overweight male. NAD  HENT:  Head: Normocephalic and atraumatic.  Right Ear: External ear normal.  Left Ear: External ear normal.  Dry oral mucosa  Eyes: Conjunctivae and EOM are normal. Pupils are equal, round, and  reactive to light.  Neck: Normal range of motion. No JVD present.  Cardiovascular: Exam reveals no gallop and no friction rub.   No murmur heard. tachycardic  Pulmonary/Chest: Effort normal and breath sounds normal. No respiratory distress. He has no wheezes. He has no rales. He exhibits no tenderness.  Abdominal: Soft. Bowel sounds are normal.  Musculoskeletal: Normal range of motion. He exhibits no edema or tenderness.  Normal 5/5 str bilaterally. Normal sensation to touch. Normal ROM.   Neurological: He is alert and oriented to person, place, and time. No cranial nerve deficit. Coordination normal.  Skin: Skin is warm and dry.  Psychiatric: He has a normal mood and affect.     Filed Vitals:   03/16/15 1342  BP: 131/77  Pulse: 111  Temp: 97.9 F (36.6 C)  Resp: 22        Assessment & Plan:  See problem based a&p.

## 2015-03-16 NOTE — Assessment & Plan Note (Addendum)
Came in with generalized weakness for 2 days. Likely 2/2 to dehydration.  Afebrile but had some congestion and cough and some SOB with exertion. Appears volume depleted on exam (dry oral mucosa), however orthostatic vitals were negative. We performed stat CBC and BMET to rule out infection, worsening anemia, electrolyte dysfunction.  Lab Results  Component Value Date   WBC 5.4 03/16/2015   HGB 11.6* 03/16/2015   HCT 36.3* 03/16/2015   MCV 76.6* 03/16/2015   PLT 159 03/16/2015     Chemistry      Component Value Date/Time   NA 145 03/16/2015 1426   K 4.2 03/16/2015 1426   CL 111 03/16/2015 1426   CO2 23 03/16/2015 1426   BUN 17 03/16/2015 1426   CREATININE 1.95* 03/16/2015 1426   CREATININE 1.49* 08/19/2012 1553      Component Value Date/Time   CALCIUM 9.4 03/16/2015 1426   ALKPHOS 75 01/18/2013 2056   AST 35 01/18/2013 2056   ALT 16 01/18/2013 2056   BILITOT <0.2* 01/18/2013 2056     BMET mostly significant for increased creatinine to 1.95 from 1.49 last. Normal BUN. Electrolytes are ok.  I think his crt bumped due to dehydration 2/2 to low PO intake. I don't feel strongly about d/cing his losartan as I expect it to improve with hydration. Recommend that he does aggressive PO hydration at home and to follow up in 1 week. -recheck BMET in 1 week.   He is on risperidone 1mg  bid. We cut it down to 0.5mg  BID as this may also cause weakness/dizziness. Also d/ced his cough medications (which he states not taking anyway).

## 2015-03-17 NOTE — Progress Notes (Signed)
Internal Medicine Clinic Attending  Case discussed with Dr. Ahmed at the time of the visit.  We reviewed the resident's history and exam and pertinent patient test results.  I agree with the assessment, diagnosis, and plan of care documented in the resident's note. 

## 2015-03-23 ENCOUNTER — Ambulatory Visit (INDEPENDENT_AMBULATORY_CARE_PROVIDER_SITE_OTHER): Payer: Medicare Other | Admitting: Internal Medicine

## 2015-03-23 ENCOUNTER — Encounter: Payer: Self-pay | Admitting: Internal Medicine

## 2015-03-23 VITALS — BP 124/70 | HR 96 | Temp 98.4°F | Wt 259.6 lb

## 2015-03-23 DIAGNOSIS — R531 Weakness: Secondary | ICD-10-CM

## 2015-03-23 DIAGNOSIS — N182 Chronic kidney disease, stage 2 (mild): Secondary | ICD-10-CM | POA: Diagnosis not present

## 2015-03-23 DIAGNOSIS — Z87898 Personal history of other specified conditions: Secondary | ICD-10-CM

## 2015-03-23 DIAGNOSIS — E1122 Type 2 diabetes mellitus with diabetic chronic kidney disease: Secondary | ICD-10-CM | POA: Diagnosis not present

## 2015-03-23 DIAGNOSIS — E119 Type 2 diabetes mellitus without complications: Secondary | ICD-10-CM

## 2015-03-23 DIAGNOSIS — Z7984 Long term (current) use of oral hypoglycemic drugs: Secondary | ICD-10-CM

## 2015-03-23 LAB — POCT GLYCOSYLATED HEMOGLOBIN (HGB A1C): HEMOGLOBIN A1C: 7

## 2015-03-23 LAB — GLUCOSE, CAPILLARY: Glucose-Capillary: 173 mg/dL — ABNORMAL HIGH (ref 65–99)

## 2015-03-23 NOTE — Assessment & Plan Note (Signed)
crt 1 week ago bumped to 1.9. Was thought to be related to low PO intake/dehydration. Will recheck today. If Crt still abnormally higher than his baseline may stop his AceI and repeat BMET or do further working including renal u/s as the next step.

## 2015-03-23 NOTE — Assessment & Plan Note (Addendum)
Resolved. Feels better. Likely was 2/2 to low water intake/dehydration. No longer feels dizzy or weak.

## 2015-03-23 NOTE — Assessment & Plan Note (Signed)
A1c up today to 7.0 from 6.5 last. Not compliant with diabetic diet. We had long discussion about avoiding carbs (sodas) and eating at home to avoid high salt/high fat/high carb foods at restaurants.  Continue metformin 500mg  bid for now. F/up in 3 months with PCP.

## 2015-03-23 NOTE — Progress Notes (Signed)
   Subjective:    Patient ID: [name] [name], male    DOB: [birth year], 65 y.o.   MRN: [id number]  HPI  64 yo male with hx of HTN, GERD, well controlled DM II, mild CKD stage 2, HLD, CVAx3 in the past, was seen on 03/16/15 for generalized weakness. He is here to follow up regarding this and also Creatinine elevation.   Generalized weakness started around 03/14/15, associated with some cough and sob with exertion. Had low PO intake. No focal neuro abnormality was noted on last visit. Appear dry overall on exam. Lab work revealed AKI with crt bump from 1.3 to 1.95, CBC was unremarkable. Plan was to do PO hydration at home and to follow up for repeat BMET for AKI. He states he is drinking more water now. Generalized weakness has resolved. Has some nasal congestion now. Not taking his claritin.  DM II: hgba1c last 6.5, today 7.0. On metformin 500mg  bid. His diet is very unhealthy. His partner states that he always eats outside in [place] and other restaurants. Diet consists of lot of sodas and high salt/high fat/high carb food. We had long discussion about healthy diet.  Review of Systems  Constitutional: Negative for fever, chills and fatigue.  HENT: Positive for congestion and postnasal drip. Negative for rhinorrhea, sinus pressure and sore throat.   Eyes: Negative for photophobia and visual disturbance.  Respiratory: Negative for apnea, chest tightness and shortness of breath.   Cardiovascular: Negative for chest pain, palpitations and leg swelling.  Gastrointestinal: Negative for abdominal pain and abdominal distention.  Endocrine: Negative.   Genitourinary: Negative for dysuria and difficulty urinating.  Musculoskeletal: Negative for back pain and arthralgias.  Neurological: Negative for dizziness, light-headedness, numbness and headaches.  Hematological: Negative.   Psychiatric/Behavioral: Negative.        Objective:   Physical Exam  Constitutional: He is oriented to person, place, and  time. He appears well-developed and well-nourished. No distress.  HENT:  Head: Normocephalic and atraumatic.  Mouth/Throat: Oropharynx is clear and moist.  Eyes: Conjunctivae are normal. Pupils are equal, round, and reactive to light.  Neck: Normal range of motion.  Pulmonary/Chest: Effort normal and breath sounds normal.  Distant breath sounds. No wheezing or crackles  Abdominal: Soft. Bowel sounds are normal. He exhibits no distension. There is no tenderness.  Musculoskeletal: Normal range of motion. He exhibits no edema or tenderness.  Neurological: He is alert and oriented to person, place, and time. No cranial nerve deficit.  Skin: He is not diaphoretic.     Filed Vitals:   03/23/15 0943  BP: 124/70  Pulse: 96  Temp: 98.4 F (36.9 C)        Assessment & Plan:  See problem based a&p.

## 2015-03-23 NOTE — Patient Instructions (Signed)
You need to stop eating outside so often. You need to eat freshly prepared home food with low salt, low fat, and low carbohydrated. It's very important to avoid high carbohdyrate/sugary foods/drinks. Don't drink sodas.   Keep doing metformin 500mg  twice a day.   Will recheck your kidney function today. Will call you if any plan changes based on lab results.

## 2015-03-24 LAB — BASIC METABOLIC PANEL
BUN / CREAT RATIO: 9 — AB (ref 10–22)
BUN: 12 mg/dL (ref 8–27)
CALCIUM: 9.1 mg/dL (ref 8.6–10.2)
CHLORIDE: 101 mmol/L (ref 96–106)
CO2: 20 mmol/L (ref 18–29)
CREATININE: 1.41 mg/dL — AB (ref 0.76–1.27)
GFR calc Af Amer: 61 mL/min/{1.73_m2} (ref >59)
GFR calc non Af Amer: 53 mL/min/{1.73_m2} — ABNORMAL LOW (ref >59)
GLUCOSE: 144 mg/dL — AB (ref 65–99)
Potassium: 4 mmol/L (ref 3.5–5.2)
Sodium: 143 mmol/L (ref 134–144)

## 2015-03-24 NOTE — Progress Notes (Signed)
Medicine attending: Medical history, presenting problems, physical findings, and medications, reviewed with resident physician Dr Tasrif Ahmed on the day of the patient visit and I concur with his evaluation and management plan. 

## 2015-04-06 ENCOUNTER — Ambulatory Visit (INDEPENDENT_AMBULATORY_CARE_PROVIDER_SITE_OTHER): Payer: Medicare Other | Admitting: Podiatry

## 2015-04-06 ENCOUNTER — Encounter: Payer: Self-pay | Admitting: Podiatry

## 2015-04-06 DIAGNOSIS — M79673 Pain in unspecified foot: Secondary | ICD-10-CM

## 2015-04-06 DIAGNOSIS — B351 Tinea unguium: Secondary | ICD-10-CM | POA: Diagnosis not present

## 2015-04-06 DIAGNOSIS — E1140 Type 2 diabetes mellitus with diabetic neuropathy, unspecified: Secondary | ICD-10-CM | POA: Diagnosis not present

## 2015-04-06 NOTE — Addendum Note (Signed)
Addended by: Ezzard Flax, Harvie Morua L on: 04/06/2015 04:18 PM   Modules accepted: Medications

## 2015-04-06 NOTE — Progress Notes (Signed)
Patient ID: [name] [name], male   DOB: 15-May-1950, 65 y.o.   MRN: [id number] Complaint:  Visit Type: Patient returns to my office for continued preventative foot care services. Complaint: Patient states" my nails have grown long and thick and become painful to walk and wear shoes" Patient has been diagnosed with DM with neuropathy.Marland Kitchen He presents for preventative foot care services. No changes to ROS  Podiatric Exam: Vascular: dorsalis pedis and posterior tibial pulses are palpable bilateral. Capillary return is immediate. Temperature gradient is WNL. Skin turgor WNL  Sensorium: Diminished  Semmes Weinstein monofilament test. Normal tactile sensation bilaterally. Nail Exam: Pt has thick disfigured discolored nails with subungual debris noted bilateral entire nail hallux through fifth toenails Ulcer Exam: There is no evidence of ulcer or pre-ulcerative changes or infection. Orthopedic Exam: Muscle tone and strength are WNL. No limitations in general ROM. No crepitus or effusions noted. Foot type and digits show no abnormalities. Bony prominences are unremarkable. Skin: No Porokeratosis. No infection or ulcers  Diagnosis:  Tinea unguium, Pain in right toe, pain in left toes  Treatment & Plan Procedures and Treatment: Consent by patient was obtained for treatment procedures. The patient understood the discussion of treatment and procedures well. All questions were answered thoroughly reviewed. Debridement of mycotic and hypertrophic toenails, 1 through 5 bilateral and clearing of subungual debris. No ulceration, no infection noted.  Return Visit-Office Procedure: Patient instructed to return to the office for a follow up visit 3 months for continued evaluation and treatment.   Gardiner Barefoot DPM

## 2015-05-21 ENCOUNTER — Other Ambulatory Visit: Payer: Self-pay | Admitting: Internal Medicine

## 2015-05-21 DIAGNOSIS — F329 Major depressive disorder, single episode, unspecified: Secondary | ICD-10-CM

## 2015-05-21 DIAGNOSIS — F32A Depression, unspecified: Secondary | ICD-10-CM

## 2015-05-23 ENCOUNTER — Other Ambulatory Visit: Payer: Self-pay | Admitting: *Deleted

## 2015-05-23 DIAGNOSIS — F329 Major depressive disorder, single episode, unspecified: Secondary | ICD-10-CM | POA: Insufficient documentation

## 2015-05-23 DIAGNOSIS — R531 Weakness: Secondary | ICD-10-CM

## 2015-05-23 DIAGNOSIS — F32A Depression, unspecified: Secondary | ICD-10-CM | POA: Insufficient documentation

## 2015-05-23 NOTE — Telephone Encounter (Signed)
Needs to talk to nurse about these meds

## 2015-05-31 IMAGING — CR DG LUMBAR SPINE COMPLETE 4+V
5 series · 5 of 5 positions shown · non-contrast
Comparison: Chest x-ray 09/14/2010.

CLINICAL DATA: Back pain

EXAM:
LUMBAR SPINE - COMPLETE 4+ VIEW

[t l-spine a.p.]
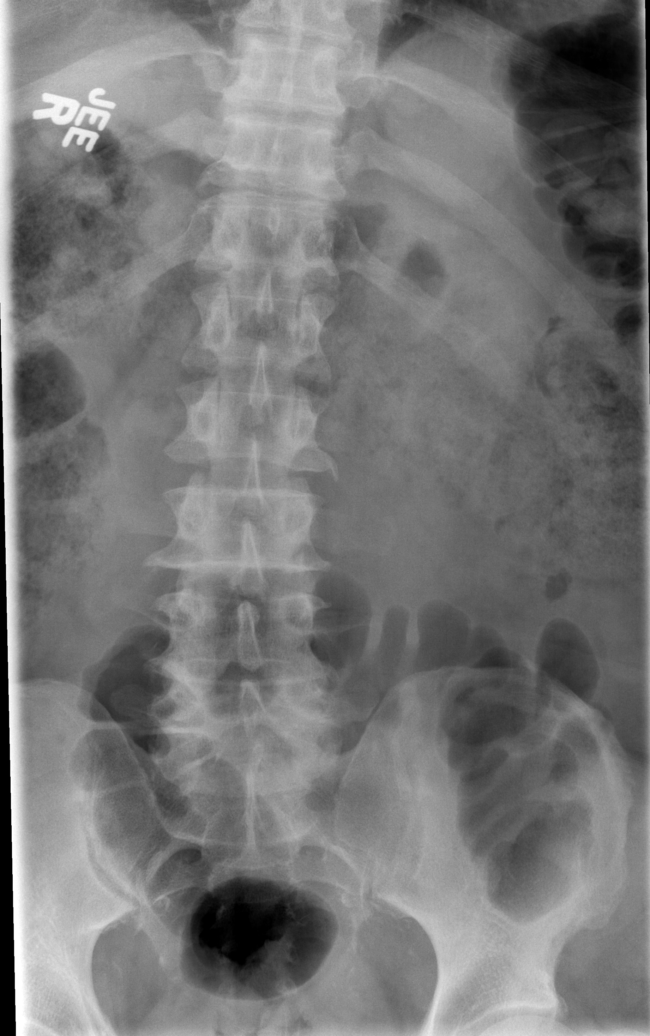

[t l-spine oblique exposure (1 of 2)]
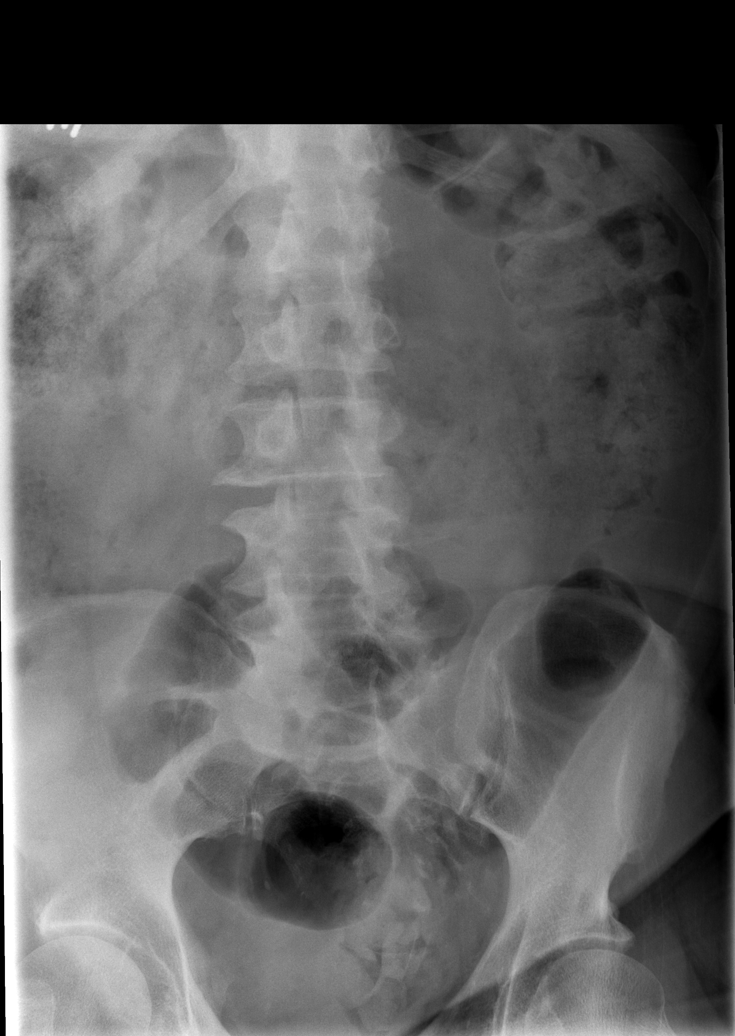

[t l-spine oblique exposure (2 of 2)]
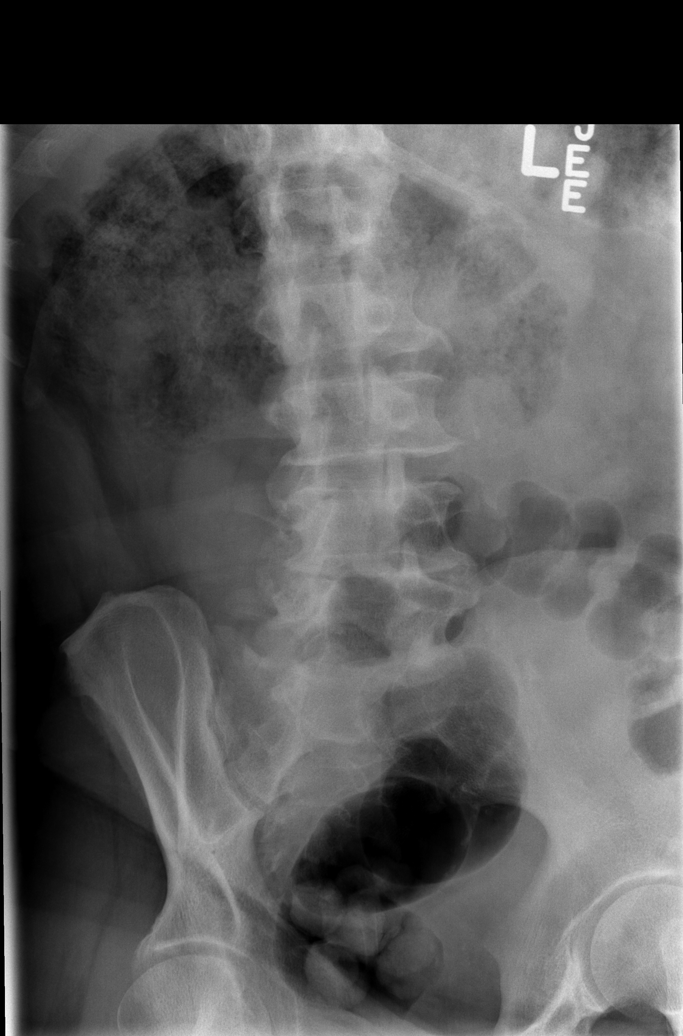

[t l-spine lat]
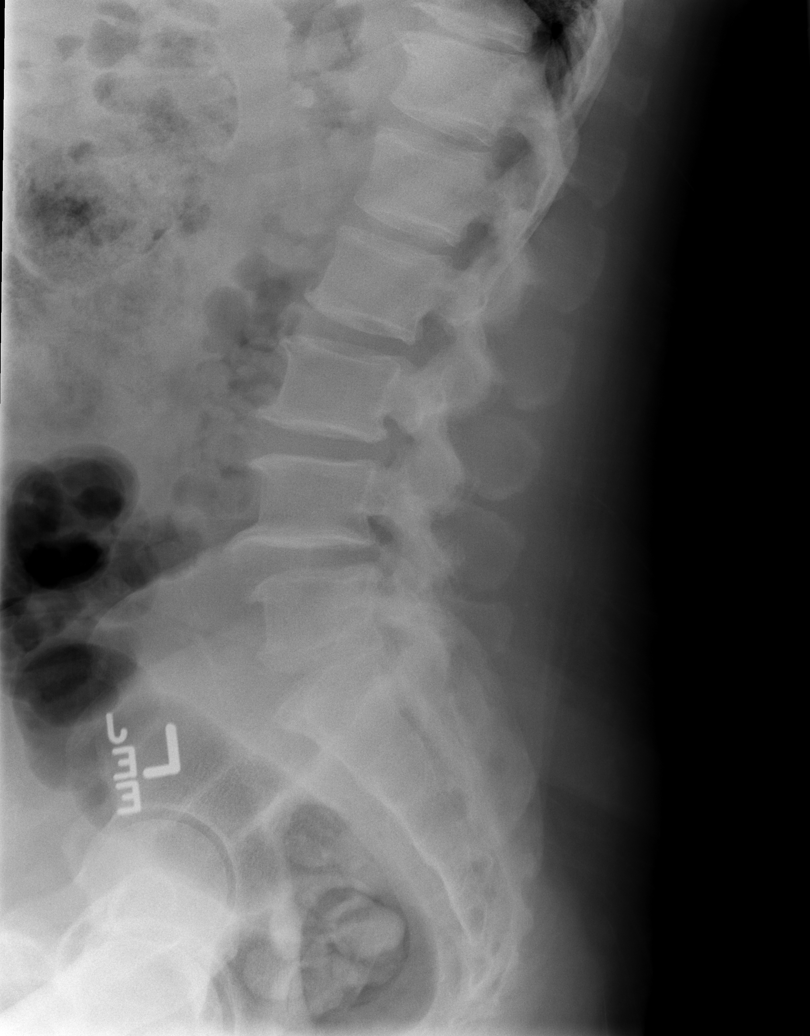

[t l-spine l5-s1 spot]
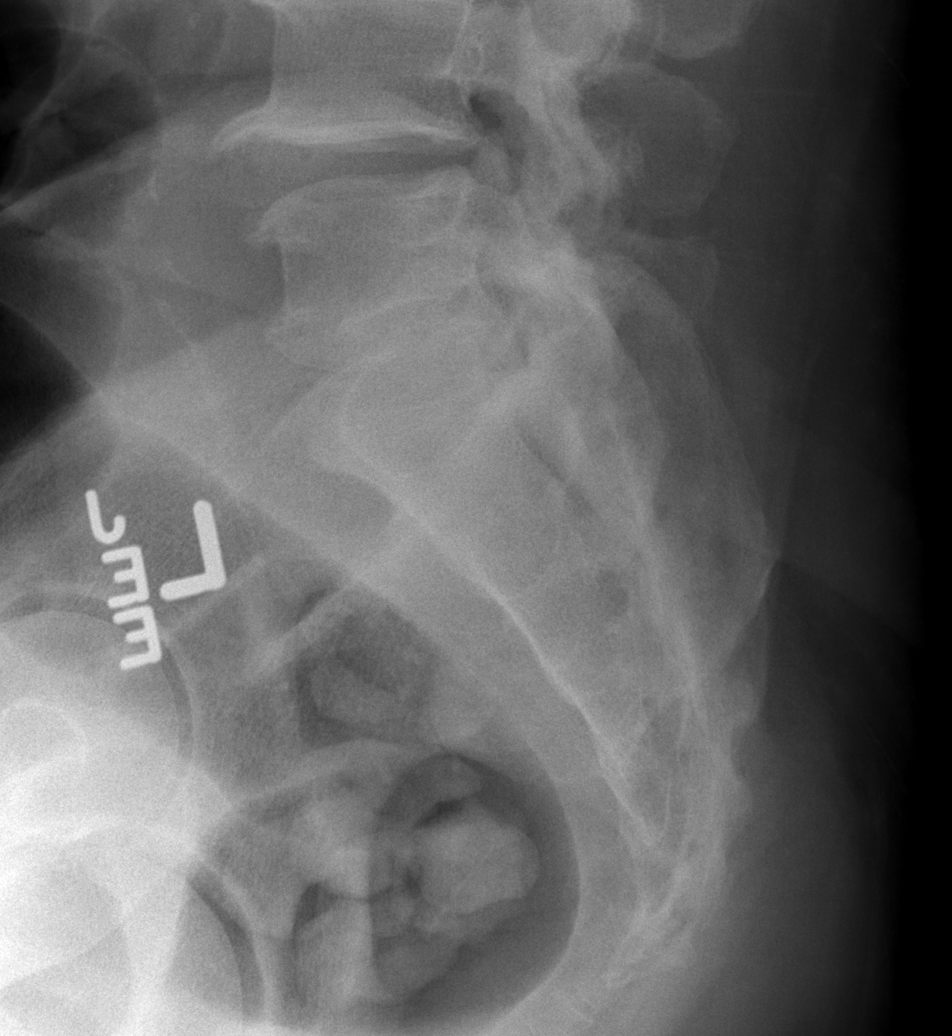

[5 of 5 positions shown; findings below may reference images not displayed]

FINDINGS: There are 5 nonrib bearing lumbar-type vertebral bodies. There is
mild anterior wedging of the T12 vertebral body unchanged compared
with 09/14/2010. The remainder of the vertebral body heights are
maintained. The alignment is anatomic. There is no spondylolysis.
There is no acute fracture or static listhesis. There is mild
degenerative disc disease of the lumbar spine most severe at L4-5
and L5-S1. There is bilateral facet arthropathy at L4-5 and L5-S1.

The SI joints are unremarkable.
IMPRESSION: No acute osseous injury of the lumbar spine.

## 2015-06-21 ENCOUNTER — Encounter: Payer: Medicare Other | Admitting: Internal Medicine

## 2015-07-06 ENCOUNTER — Ambulatory Visit: Payer: Medicare Other | Admitting: Podiatry

## 2015-07-19 ENCOUNTER — Ambulatory Visit (INDEPENDENT_AMBULATORY_CARE_PROVIDER_SITE_OTHER): Payer: Medicare Other | Admitting: Internal Medicine

## 2015-07-19 ENCOUNTER — Encounter: Payer: Self-pay | Admitting: Internal Medicine

## 2015-07-19 VITALS — BP 140/80 | HR 90 | Temp 98.4°F | Ht 66.0 in | Wt 257.7 lb

## 2015-07-19 DIAGNOSIS — E119 Type 2 diabetes mellitus without complications: Secondary | ICD-10-CM | POA: Diagnosis not present

## 2015-07-19 DIAGNOSIS — M5489 Other dorsalgia: Secondary | ICD-10-CM | POA: Diagnosis not present

## 2015-07-19 DIAGNOSIS — Z7984 Long term (current) use of oral hypoglycemic drugs: Secondary | ICD-10-CM

## 2015-07-19 DIAGNOSIS — I6930 Unspecified sequelae of cerebral infarction: Secondary | ICD-10-CM | POA: Insufficient documentation

## 2015-07-19 LAB — POCT GLYCOSYLATED HEMOGLOBIN (HGB A1C): HEMOGLOBIN A1C: 6.9

## 2015-07-19 LAB — GLUCOSE, CAPILLARY: Glucose-Capillary: 90 mg/dL (ref 65–99)

## 2015-07-19 MED ORDER — DICLOFENAC SODIUM 1 % TD GEL: 2.0000 g | Freq: Four times a day (QID) | TRANSDERMAL | Status: DC | PRN

## 2015-07-19 NOTE — Patient Instructions (Signed)
Start using voltaren gel applied to area of back pain up to 4 times a day.

## 2015-07-19 NOTE — Progress Notes (Signed)
Subjective:   Patient ID: [name] [name] male   DOB: Nov 09, 1950 65 y.o.   MRN: [id number]  HPI: ThomasCorwyn MOSA [name] is a 65 y.o. with past medical history as outlined below who presents to clinic for diabetes follow up.   He is also having 7/10 rt upper back pain x 1 week that has been improving since it first started.   Please see problem list for status of the pt's chronic medical problems.  Past Medical History  Diagnosis Date  . Stroke [facility])     3 strokes (2004, 2006, 2007)  . Diabetes mellitus   . Depression   . Hypertension   . ANEMIA, NORMOCYTIC, CHRONIC 11/17/2007    Qualifier: Diagnosis of  By: Cruzita Lederer MD, Salena Saner    . Dementia of frontal lobe type 12/17/2010  . HYPERLIPIDEMIA 04/24/2006    Qualifier: Diagnosis of  By: Cruzita Lederer MD, Salena Saner    . RENAL INSUFFICIENCY, CHRONIC 04/25/2006    Qualifier: Diagnosis of  By: Cruzita Lederer MD, Salena Saner    . GERD (gastroesophageal reflux disease)   . Colon polyp 06/12/12    Colonoscopy and polypectomy by Dr. Deatra Ina revealed tubular adenoma and tubulovillous adenoma   Current Outpatient Prescriptions  Medication Sig Dispense Refill  . amitriptyline (ELAVIL) 50 MG tablet Take 1 tablet by mouth at  bedtime 90 tablet 3  . amLODipine (NORVASC) 10 MG tablet Take 1 tablet by mouth  daily 90 tablet 3  . atorvastatin (LIPITOR) 40 MG tablet Take 1 tablet by mouth  daily 90 tablet 3  . Cetirizine HCl 10 MG CAPS Take 1 capsule (10 mg total) by mouth daily. 30 capsule 11  . dipyridamole-aspirin (AGGRENOX) 200-25 MG 12hr capsule Take 1 capsule by mouth two times daily 180 capsule 3  . losartan (COZAAR) 100 MG tablet Take 1 tablet by mouth  daily 90 tablet 4  . metFORMIN (GLUCOPHAGE) 500 MG tablet Take 1 tablet by mouth  twice a day with a meal 180 tablet 3  . omeprazole (PRILOSEC) 20 MG capsule Take 1 capsule by mouth  twice daily before a meal 180 capsule 3  . risperiDONE (RISPERDAL) 0.5 MG tablet Take 1 tablet (0.5 mg total) by mouth 2 (two) times daily.  60 tablet 3   No current facility-administered medications for this visit.   Family History  Problem Relation Age of Onset  . Diabetes Maternal Grandmother   . Colon cancer Neg Hx   . Stroke Mother   . Cirrhosis Son   . Diabetes Maternal Grandfather   . Hypertension Maternal Grandfather    Social History   Social History  . Marital Status: Married    Spouse Name: N/A  . Number of Children: N/A  . Years of Education: N/A   Social History Main Topics  . Smoking status: Former Smoker    Quit date: 04/29/2005  . Smokeless tobacco: Never Used  . Alcohol Use: No  . Drug Use: No  . Sexual Activity: Not on file   Other Topics Concern  . Not on file   Social History Narrative   Patient lives at home with his wife. His son helps keep an eye on him while the wife is at work.   Review of Systems: General: denies weakness and fatigue, appetite is good MSK:rt upper side back pain, denies heavy lifting and recent increase in exertional activity.  GU: denies dysuria.  All other systems negative.   Objective:  Physical Exam: Filed Vitals:   07/19/15 1540 07/19/15 1608  BP: 169/75 140/80  Pulse: 97 90  Temp: 98.4 F (36.9 C)   TempSrc: Oral   Height: 5\' 6"  (1.676 m)   Weight: 257 lb 11.2 oz (116.892 kg)   SpO2: 99%    Physical Exam  Constitutional: He appears well-developed and well-nourished. No distress.  HENT:  Head: Normocephalic and atraumatic.  Nose: Nose normal.  Cardiovascular: Normal rate, regular rhythm and normal heart sounds.  Exam reveals no gallop and no friction rub.   No murmur heard. Pulmonary/Chest: Effort normal and breath sounds normal. No respiratory distress. He has no wheezes. He has no rales.  Abdominal: Soft. Bowel sounds are normal. He exhibits no distension. There is no tenderness. There is no rebound and no guarding.  Musculoskeletal: He exhibits tenderness (TTP of rt lower trapezius muscle, no spinal tenderness.).  Skin: Skin is warm and dry.  No rash noted. He is not diaphoretic. No erythema. No pallor.    Assessment & Plan:   Please see problem based assessment and plan.

## 2015-07-20 DIAGNOSIS — M549 Dorsalgia, unspecified: Secondary | ICD-10-CM | POA: Insufficient documentation

## 2015-07-20 NOTE — Assessment & Plan Note (Signed)
Pt reports rt upper back pain x 1 week that has since been improving. He is TTP of rt upper back area over lower trapezius muscle. He has hx of strokes and his wife states he sleeps in odd positions and has a sedentary lifestyle. Likely pain is MSK. He is part of the [employer] program but has not gone in a while. His wife is interested in PT to help him regain his strength.   - rx for voltaren gel  - referral to PT

## 2015-07-20 NOTE — Assessment & Plan Note (Signed)
Lab Results  Component Value Date   HGBA1C 6.9 07/19/2015   HGBA1C 7.0 03/23/2015   HGBA1C 6.5 10/19/2014     Assessment: Diabetes control:  controlled Progress toward A1C goal:   at goal Comments: on metformin 500mg  BID  Plan: Medications:  continue current medications Instruction/counseling given: discussed diet Educational resources provided:   Self management tools provided:   Other plans: f/u in 3 months for a1c.

## 2015-07-21 NOTE — Progress Notes (Signed)
Internal Medicine Clinic Attending  Case discussed with Dr. Truong at the time of the visit.  We reviewed the resident's history and exam and pertinent patient test results.  I agree with the assessment, diagnosis, and plan of care documented in the resident's note.  

## 2015-07-25 ENCOUNTER — Encounter: Payer: Self-pay | Admitting: Internal Medicine

## 2015-07-26 ENCOUNTER — Ambulatory Visit: Payer: Medicare Other | Admitting: Podiatry

## 2015-08-01 ENCOUNTER — Other Ambulatory Visit: Payer: Self-pay | Admitting: Internal Medicine

## 2015-08-02 ENCOUNTER — Ambulatory Visit: Payer: Medicare Other | Admitting: Podiatry

## 2015-08-03 NOTE — Addendum Note (Signed)
Addended by: Hulan Fray on: 08/03/2015 05:02 PM   Modules accepted: Orders

## 2015-08-12 ENCOUNTER — Other Ambulatory Visit: Payer: Self-pay | Admitting: Internal Medicine

## 2015-08-16 ENCOUNTER — Ambulatory Visit (INDEPENDENT_AMBULATORY_CARE_PROVIDER_SITE_OTHER): Payer: Medicare Other | Admitting: Podiatry

## 2015-08-16 DIAGNOSIS — M79673 Pain in unspecified foot: Secondary | ICD-10-CM | POA: Diagnosis not present

## 2015-08-16 DIAGNOSIS — B351 Tinea unguium: Secondary | ICD-10-CM

## 2015-08-16 DIAGNOSIS — E1140 Type 2 diabetes mellitus with diabetic neuropathy, unspecified: Secondary | ICD-10-CM

## 2015-08-16 NOTE — Progress Notes (Signed)
Patient ID: [name] [name], male   DOB: 11/16/1950, 65 y.o.   MRN: [id number] Complaint:  Visit Type: Patient returns to my office for continued preventative foot care services. Complaint: Patient states" my nails have grown long and thick and become painful to walk and wear shoes" Patient has been diagnosed with DM with neuropathy.. He presents for preventative foot care services. No changes to ROS  Podiatric Exam: Vascular: dorsalis pedis and posterior tibial pulses are palpable bilateral. Capillary return is immediate. Temperature gradient is WNL. Skin turgor WNL  Sensorium: Diminished  Semmes Weinstein monofilament test. Normal tactile sensation bilaterally. Nail Exam: Pt has thick disfigured discolored nails with subungual debris noted bilateral entire nail hallux through fifth toenails Ulcer Exam: There is no evidence of ulcer or pre-ulcerative changes or infection. Orthopedic Exam: Muscle tone and strength are WNL. No limitations in general ROM. No crepitus or effusions noted. Foot type and digits show no abnormalities. Bony prominences are unremarkable. Skin: No Porokeratosis. No infection or ulcers  Diagnosis:  Tinea unguium, Pain in right toe, pain in left toes  Treatment & Plan Procedures and Treatment: Consent by patient was obtained for treatment procedures. The patient understood the discussion of treatment and procedures well. All questions were answered thoroughly reviewed. Debridement of mycotic and hypertrophic toenails, 1 through 5 bilateral and clearing of subungual debris. No ulceration, no infection noted.  Return Visit-Office Procedure: Patient instructed to return to the office for a follow up visit 3 months for continued evaluation and treatment.   Devora Tortorella DPM 

## 2015-11-17 ENCOUNTER — Ambulatory Visit: Payer: Medicare Other

## 2015-11-22 ENCOUNTER — Encounter: Payer: Self-pay | Admitting: Internal Medicine

## 2015-11-22 ENCOUNTER — Ambulatory Visit (INDEPENDENT_AMBULATORY_CARE_PROVIDER_SITE_OTHER): Payer: Medicare Other | Admitting: Internal Medicine

## 2015-11-22 ENCOUNTER — Ambulatory Visit: Payer: Medicare Other

## 2015-11-22 VITALS — BP 143/82 | HR 97 | Temp 99.0°F | Ht 61.0 in | Wt 258.6 lb

## 2015-11-22 DIAGNOSIS — I10 Essential (primary) hypertension: Secondary | ICD-10-CM

## 2015-11-22 DIAGNOSIS — Z23 Encounter for immunization: Secondary | ICD-10-CM

## 2015-11-22 DIAGNOSIS — E119 Type 2 diabetes mellitus without complications: Secondary | ICD-10-CM | POA: Diagnosis not present

## 2015-11-22 DIAGNOSIS — Z9109 Other allergy status, other than to drugs and biological substances: Secondary | ICD-10-CM | POA: Diagnosis not present

## 2015-11-22 DIAGNOSIS — Z79899 Other long term (current) drug therapy: Secondary | ICD-10-CM

## 2015-11-22 DIAGNOSIS — Z7984 Long term (current) use of oral hypoglycemic drugs: Secondary | ICD-10-CM

## 2015-11-22 DIAGNOSIS — Z87891 Personal history of nicotine dependence: Secondary | ICD-10-CM

## 2015-11-22 LAB — POCT GLYCOSYLATED HEMOGLOBIN (HGB A1C): Hemoglobin A1C: 6.8

## 2015-11-22 LAB — GLUCOSE, CAPILLARY: GLUCOSE-CAPILLARY: 167 mg/dL — AB (ref 65–99)

## 2015-11-22 MED ORDER — METFORMIN HCL 500 MG PO TABS: ORAL_TABLET | ORAL | 3 refills | Status: DC

## 2015-11-22 MED ORDER — AMLODIPINE BESYLATE 10 MG PO TABS: 10.0000 mg | ORAL_TABLET | Freq: Every day | ORAL | 0 refills | Status: DC

## 2015-11-22 MED ORDER — ATORVASTATIN CALCIUM 40 MG PO TABS: 40.0000 mg | ORAL_TABLET | Freq: Every day | ORAL | 0 refills | Status: DC

## 2015-11-22 MED ORDER — AMITRIPTYLINE HCL 50 MG PO TABS: 50.0000 mg | ORAL_TABLET | Freq: Every day | ORAL | 0 refills | Status: DC

## 2015-11-22 MED ORDER — CETIRIZINE HCL 10 MG PO CAPS: 10.0000 mg | ORAL_CAPSULE | Freq: Every day | ORAL | 3 refills | Status: DC

## 2015-11-22 MED ORDER — ASPIRIN-DIPYRIDAMOLE ER 25-200 MG PO CP12: ORAL_CAPSULE | ORAL | 0 refills | Status: DC

## 2015-11-22 MED ORDER — RISPERIDONE 0.5 MG PO TABS: 0.5000 mg | ORAL_TABLET | Freq: Two times a day (BID) | ORAL | 0 refills | Status: DC

## 2015-11-22 NOTE — Progress Notes (Signed)
   CC: Follow-up on diabetes mellitus, type II  HPI:  [name] is a 64 y.o. gentleman with history noted below that presents to the internal medicine clinic for follow-up on diabetes. Today patient states he is doing well and has no questions or concerns. I asked about his blood glucose readings and he stated that his blood glucometer broke 5-6 months ago and has not been able to check his blood glucose regularly.  Today he denies shortness of breath, chest pain, vision changes, abdominal pain or leg swelling.  Past Medical History:  Diagnosis Date  . ANEMIA, NORMOCYTIC, CHRONIC 11/17/2007   Qualifier: Diagnosis of  By: Cruzita Lederer MD, Salena Saner    . Colon polyp 06/12/12   Colonoscopy and polypectomy by Dr. Deatra Ina revealed tubular adenoma and tubulovillous adenoma  . Dementia of frontal lobe type 12/17/2010  . Depression   . Diabetes mellitus   . GERD (gastroesophageal reflux disease)   . HYPERLIPIDEMIA 04/24/2006   Qualifier: Diagnosis of  By: Cruzita Lederer MD, Salena Saner    . Hypertension   . RENAL INSUFFICIENCY, CHRONIC 04/25/2006   Qualifier: Diagnosis of  By: Cruzita Lederer MD, Salena Saner    . Stroke [facility])    3 strokes (2004, 2006, 2007)    Review of Systems:  Review of Systems  Eyes: Negative for blurred vision.  Respiratory: Negative for cough.   Cardiovascular: Negative for chest pain.  Gastrointestinal: Negative for abdominal pain.  Genitourinary: Negative for frequency and urgency.     Physical Exam:  Vitals:   11/22/15 1043  BP: (!) 143/82  Pulse: 97  Temp: 99 F (37.2 C)  TempSrc: Oral  SpO2: 96%  Weight: 258 lb 9.6 oz (117.3 kg)  Height: 5\' 1"  (1.549 m)   Physical Exam  Cardiovascular: Normal rate, regular rhythm and normal heart sounds.  Exam reveals no gallop and no friction rub.   No murmur heard. Pulmonary/Chest: Effort normal and breath sounds normal. No respiratory distress. He has no wheezes. He has no rales.  Abdominal: Soft. He exhibits no distension. There is  no tenderness.  Musculoskeletal: He exhibits no edema.  Skin: Skin is warm and dry.    Assessment & Plan:   See encounters tab for problem based medical decision making.   Patient seen with Dr. Evette Doffing

## 2015-11-22 NOTE — Patient Instructions (Addendum)
Mr. Bloedorn  It was a pleasure meeting you and your wife today. You can obtain a blood glucose monitor from the outpatient [facility]. Please follow up with your primary care doctor, Dr. Aurelio Brash in 3 months.

## 2015-11-23 NOTE — Assessment & Plan Note (Signed)
  Assessment: Diabetes mellitus type 2  Patient is unable to check daily blood glucose levels. Today his A1c is 6.8 medical. Previous was 6.9 on 07/19/15.  He takes metformin 500 mg twice daily.  We will continue his current medication regimen. Patient was advised to go to the [facility] outpatient clinic to obtain a new glucometer.    Plan -Refilled metformin 500 mg twice a day

## 2015-11-23 NOTE — Assessment & Plan Note (Signed)
Assessment: Essential hypertension Patient's blood pressure today was 143/82 and stable. Patient takes amlodipine and losartan daily. We will continue with this regimen.  Plan -Continue losartan - Refilled amlodipine

## 2015-11-23 NOTE — Assessment & Plan Note (Signed)
Assessment: environmental allergies Stable  Plan -refill ceterizine

## 2015-11-23 NOTE — Progress Notes (Signed)
Internal Medicine Clinic Attending  I saw and evaluated the patient.  I personally confirmed the key portions of the history and exam documented by Dr. Hoffman and I reviewed pertinent patient test results.  The assessment, diagnosis, and plan were formulated together and I agree with the documentation in the resident's note.      

## 2015-12-22 ENCOUNTER — Other Ambulatory Visit: Payer: Self-pay | Admitting: Internal Medicine

## 2016-01-10 ENCOUNTER — Other Ambulatory Visit: Payer: Self-pay | Admitting: Internal Medicine

## 2016-01-16 ENCOUNTER — Other Ambulatory Visit: Payer: Self-pay

## 2016-01-16 NOTE — Telephone Encounter (Signed)
Return call to [place] - no answer; left message to give [place] a call back.

## 2016-01-16 NOTE — Telephone Encounter (Signed)
Needs to speak with a nurse regarding meds.  

## 2016-01-22 ENCOUNTER — Other Ambulatory Visit: Payer: Self-pay | Admitting: *Deleted

## 2016-01-22 DIAGNOSIS — Z9109 Other allergy status, other than to drugs and biological substances: Secondary | ICD-10-CM

## 2016-01-23 ENCOUNTER — Telehealth: Payer: Self-pay

## 2016-01-23 MED ORDER — DICLOFENAC SODIUM 1 % TD GEL: 2.0000 g | Freq: Four times a day (QID) | TRANSDERMAL | 0 refills | Status: DC | PRN

## 2016-01-23 MED ORDER — AMITRIPTYLINE HCL 50 MG PO TABS: 50.0000 mg | ORAL_TABLET | Freq: Every day | ORAL | 0 refills | Status: DC

## 2016-01-23 MED ORDER — RISPERIDONE 0.5 MG PO TABS: 0.5000 mg | ORAL_TABLET | Freq: Two times a day (BID) | ORAL | 0 refills | Status: DC

## 2016-01-23 MED ORDER — ASPIRIN-DIPYRIDAMOLE ER 25-200 MG PO CP12: 1.0000 | ORAL_CAPSULE | Freq: Two times a day (BID) | ORAL | 0 refills | Status: DC

## 2016-01-23 MED ORDER — OMEPRAZOLE 20 MG PO CPDR: 20.0000 mg | DELAYED_RELEASE_CAPSULE | Freq: Two times a day (BID) | ORAL | 0 refills | Status: DC

## 2016-01-23 MED ORDER — ATORVASTATIN CALCIUM 40 MG PO TABS: 40.0000 mg | ORAL_TABLET | Freq: Every day | ORAL | 0 refills | Status: DC

## 2016-01-23 MED ORDER — LOSARTAN POTASSIUM 100 MG PO TABS: 100.0000 mg | ORAL_TABLET | Freq: Every day | ORAL | 0 refills | Status: DC

## 2016-01-23 MED ORDER — CETIRIZINE HCL 10 MG PO CAPS: 10.0000 mg | ORAL_CAPSULE | Freq: Every day | ORAL | 0 refills | Status: DC

## 2016-01-23 MED ORDER — METFORMIN HCL 500 MG PO TABS: ORAL_TABLET | ORAL | 0 refills | Status: DC

## 2016-01-23 MED ORDER — AMLODIPINE BESYLATE 10 MG PO TABS: 10.0000 mg | ORAL_TABLET | Freq: Every day | ORAL | 0 refills | Status: DC

## 2016-01-23 NOTE — Telephone Encounter (Signed)
dipyridamole-aspirin (AGGRENOX) 200-25 MG 12hr capsule, Refill request @ [facility] on [address].

## 2016-01-23 NOTE — Telephone Encounter (Signed)
This has been done, I spoke w/ pt's wife yesterday, I have called her today and let her know to go to [facility], yesterday she insisted that he needed all meds for a month at [facility] today it is only aggrenox nonetheless she can pick it up at [facility]

## 2016-02-20 ENCOUNTER — Other Ambulatory Visit: Payer: Self-pay | Admitting: Internal Medicine

## 2016-03-06 ENCOUNTER — Encounter: Payer: Self-pay | Admitting: Internal Medicine

## 2016-03-06 ENCOUNTER — Encounter: Payer: Medicare Other | Admitting: Internal Medicine

## 2016-03-07 ENCOUNTER — Telehealth: Payer: Self-pay

## 2016-03-07 MED ORDER — RISPERIDONE 0.5 MG PO TABS: 0.5000 mg | ORAL_TABLET | Freq: Two times a day (BID) | ORAL | 0 refills | Status: DC

## 2016-03-07 MED ORDER — AMITRIPTYLINE HCL 50 MG PO TABS: 50.0000 mg | ORAL_TABLET | Freq: Every day | ORAL | 0 refills | Status: DC

## 2016-03-07 NOTE — Telephone Encounter (Signed)
amitriptyline (ELAVIL) 50 MG tablet, risperiDONE (RISPERDAL) 0.5 MG tablet, refill request @ [facility].

## 2016-03-11 NOTE — Telephone Encounter (Signed)
Patient no showed appt on 03-06-16.  No show letter was mailed, but made a second appt for next month on 04/03/16 at 1:15 pm.  Appt card mailed.

## 2016-04-03 ENCOUNTER — Ambulatory Visit (INDEPENDENT_AMBULATORY_CARE_PROVIDER_SITE_OTHER): Payer: PPO | Admitting: Internal Medicine

## 2016-04-03 ENCOUNTER — Encounter: Payer: Self-pay | Admitting: Internal Medicine

## 2016-04-03 VITALS — BP 140/65 | HR 94 | Temp 97.3°F | Ht 61.0 in | Wt 251.2 lb

## 2016-04-03 DIAGNOSIS — Z87891 Personal history of nicotine dependence: Secondary | ICD-10-CM

## 2016-04-03 DIAGNOSIS — E119 Type 2 diabetes mellitus without complications: Secondary | ICD-10-CM | POA: Diagnosis not present

## 2016-04-03 DIAGNOSIS — F32A Depression, unspecified: Secondary | ICD-10-CM

## 2016-04-03 DIAGNOSIS — K219 Gastro-esophageal reflux disease without esophagitis: Secondary | ICD-10-CM

## 2016-04-03 DIAGNOSIS — Z79899 Other long term (current) drug therapy: Secondary | ICD-10-CM | POA: Diagnosis not present

## 2016-04-03 DIAGNOSIS — E785 Hyperlipidemia, unspecified: Secondary | ICD-10-CM | POA: Diagnosis not present

## 2016-04-03 DIAGNOSIS — Z7984 Long term (current) use of oral hypoglycemic drugs: Secondary | ICD-10-CM

## 2016-04-03 DIAGNOSIS — I10 Essential (primary) hypertension: Secondary | ICD-10-CM | POA: Diagnosis not present

## 2016-04-03 DIAGNOSIS — F329 Major depressive disorder, single episode, unspecified: Secondary | ICD-10-CM

## 2016-04-03 DIAGNOSIS — Z0000 Encounter for general adult medical examination without abnormal findings: Secondary | ICD-10-CM

## 2016-04-03 LAB — GLUCOSE, CAPILLARY: Glucose-Capillary: 136 mg/dL — ABNORMAL HIGH (ref 65–99)

## 2016-04-03 LAB — POCT GLYCOSYLATED HEMOGLOBIN (HGB A1C): Hemoglobin A1C: 6.5

## 2016-04-03 MED ORDER — ATORVASTATIN CALCIUM 40 MG PO TABS: 40.0000 mg | ORAL_TABLET | Freq: Every day | ORAL | 3 refills | Status: DC

## 2016-04-03 MED ORDER — AMLODIPINE BESYLATE 10 MG PO TABS: 10.0000 mg | ORAL_TABLET | Freq: Every day | ORAL | 3 refills | Status: DC

## 2016-04-03 MED ORDER — OMEPRAZOLE 20 MG PO CPDR: 20.0000 mg | DELAYED_RELEASE_CAPSULE | Freq: Two times a day (BID) | ORAL | 3 refills | Status: DC

## 2016-04-03 MED ORDER — ASPIRIN-DIPYRIDAMOLE ER 25-200 MG PO CP12: 1.0000 | ORAL_CAPSULE | Freq: Two times a day (BID) | ORAL | 3 refills | Status: DC

## 2016-04-03 MED ORDER — LOSARTAN POTASSIUM 100 MG PO TABS: 100.0000 mg | ORAL_TABLET | Freq: Every day | ORAL | 3 refills | Status: DC

## 2016-04-03 MED ORDER — HYDROCHLOROTHIAZIDE 12.5 MG PO CAPS: 12.5000 mg | ORAL_CAPSULE | Freq: Every day | ORAL | 11 refills | Status: DC

## 2016-04-03 MED ORDER — RISPERIDONE 0.5 MG PO TABS: 0.5000 mg | ORAL_TABLET | Freq: Two times a day (BID) | ORAL | 3 refills | Status: DC

## 2016-04-03 MED ORDER — METFORMIN HCL 500 MG PO TABS: ORAL_TABLET | ORAL | 3 refills | Status: DC

## 2016-04-03 MED ORDER — AMITRIPTYLINE HCL 50 MG PO TABS: 50.0000 mg | ORAL_TABLET | Freq: Every day | ORAL | 3 refills | Status: DC

## 2016-04-03 NOTE — Progress Notes (Signed)
Internal Medicine Clinic Attending  Case discussed with Dr. Truong at the time of the visit.  We reviewed the resident's history and exam and pertinent patient test results.  I agree with the assessment, diagnosis, and plan of care documented in the resident's note.  

## 2016-04-03 NOTE — Assessment & Plan Note (Addendum)
A: pt is on prilosec 20mg  BID and does not report any worsening GERD sx. Advised to decrease intake of fried foods.   P: refilled prilosec 20mg  BID.

## 2016-04-03 NOTE — Assessment & Plan Note (Addendum)
Checking Hep C and HIV. Repeat colo due in 2019. SW consulted for home health aid and PT. Refilled aggrenox which he is on for hx of stroke.

## 2016-04-03 NOTE — Assessment & Plan Note (Signed)
Stable, on high intensity statin and tolerating it well. No need to check lipid panel. refilled lipitor 40mg .

## 2016-04-03 NOTE — Assessment & Plan Note (Signed)
A: States mood is stable. On amtriptyline 50mg  qhs and risperidone 0.5mg  BID.   P: refilled meds.

## 2016-04-03 NOTE — Patient Instructions (Signed)
Start taking hydrochlorothiazide 12.5mg  daily for your blood pressure.

## 2016-04-03 NOTE — Progress Notes (Signed)
   CC: diabetes   HPI:  [name] [name] is a 66 y.o. with PMHx as outlined below who presents to clinic for diabetes follow up. Please see problem list for further details of patient's chronic medical issues.   Requesting refill of all medications to new pharmacy. No complaints.   Past Medical History:  Diagnosis Date  . ANEMIA, NORMOCYTIC, CHRONIC 11/17/2007   Qualifier: Diagnosis of  By: Cruzita Lederer MD, Salena Saner    . Colon polyp 06/12/12   Colonoscopy and polypectomy by Dr. Deatra Ina revealed tubular adenoma and tubulovillous adenoma  . Dementia of frontal lobe type 12/17/2010  . Depression   . Diabetes mellitus   . GERD (gastroesophageal reflux disease)   . HYPERLIPIDEMIA 04/24/2006   Qualifier: Diagnosis of  By: Cruzita Lederer MD, Salena Saner    . Hypertension   . RENAL INSUFFICIENCY, CHRONIC 04/25/2006   Qualifier: Diagnosis of  By: Cruzita Lederer MD, Salena Saner    . Stroke [facility])    3 strokes (2004, 2006, 2007)    Review of Systems:  Denies diarrhea, SOB, CP, abd pain, n/v.   Physical Exam:  Vitals:   04/03/16 1359  BP: 140/65  Pulse: 94  Temp: 97.3 F (36.3 C)  TempSrc: Oral  SpO2: 100%  Weight: 251 lb 3.2 oz (113.9 kg)  Height: 5\' 1"  (1.549 m)   Physical Exam  Constitutional:  appears well-developed and well-nourished. No distress.  HENT:  Head: Normocephalic and atraumatic.  Nose: Nose normal.  Cardiovascular: Normal rate, regular rhythm and normal heart sounds.  Exam reveals no gallop and no friction rub.   No murmur heard. Pulmonary/Chest: Effort normal and breath sounds normal. No respiratory distress.  has no wheezes.no rales.  Abdominal: Soft. Bowel sounds are normal.  exhibits no distension. There is no tenderness. There is no rebound and no guarding.   Skin: Skin is warm and dry. No rash noted.  not diaphoretic. No erythema. No pallor.    Assessment & Plan:   See Encounters Tab for problem based charting.  Patient discussed with Dr. Lynnae January

## 2016-04-03 NOTE — Assessment & Plan Note (Signed)
A: a1c6.5 today. He is on metformin 500mg  daily and denies any GI symptoms.   P: foot exam done today. Referral placed for eye exam. Continue metformin. f/u in 3 mos for a1c.

## 2016-04-03 NOTE — Assessment & Plan Note (Signed)
A: Bp elevated. On norvasc 10mg  and losartan 100mg  which he took today.   P; start on HCTZ 12.5 mg for goal BP <130/80 due to HTN and DM. Checking BMET. F/u in 1 month for BP check and BMET.

## 2016-04-08 ENCOUNTER — Telehealth: Payer: Self-pay

## 2016-04-09 ENCOUNTER — Telehealth: Payer: Self-pay | Admitting: *Deleted

## 2016-04-09 DIAGNOSIS — Z0000 Encounter for general adult medical examination without abnormal findings: Secondary | ICD-10-CM

## 2016-04-09 DIAGNOSIS — E119 Type 2 diabetes mellitus without complications: Secondary | ICD-10-CM

## 2016-04-09 DIAGNOSIS — F32A Depression, unspecified: Secondary | ICD-10-CM

## 2016-04-09 DIAGNOSIS — F329 Major depressive disorder, single episode, unspecified: Secondary | ICD-10-CM

## 2016-04-09 MED ORDER — ASPIRIN-DIPYRIDAMOLE ER 25-200 MG PO CP12: 1.0000 | ORAL_CAPSULE | Freq: Two times a day (BID) | ORAL | 3 refills | Status: DC

## 2016-04-09 MED ORDER — RISPERIDONE 0.5 MG PO TABS: 0.5000 mg | ORAL_TABLET | Freq: Two times a day (BID) | ORAL | 3 refills | Status: DC

## 2016-04-09 MED ORDER — METFORMIN HCL 500 MG PO TABS: ORAL_TABLET | ORAL | 3 refills | Status: DC

## 2016-04-09 NOTE — Telephone Encounter (Signed)
Done. Thanks!  Dr. Euphemia Lingerfelt 

## 2016-04-09 NOTE — Telephone Encounter (Signed)
Pharmacy called, please change risperidone to 180 w/ 3 refills  Metformin 180 w/ 3 refills aggrenox 180 w/ 3 refills Gave VO to change

## 2016-04-09 NOTE — Addendum Note (Signed)
Addended by: Norman Herrlich on: 04/09/2016 02:45 PM   Modules accepted: Orders

## 2016-04-12 ENCOUNTER — Encounter: Payer: Self-pay | Admitting: *Deleted

## 2016-04-16 ENCOUNTER — Encounter (HOSPITAL_COMMUNITY): Payer: Self-pay | Admitting: Emergency Medicine

## 2016-04-16 ENCOUNTER — Emergency Department (HOSPITAL_COMMUNITY)
Admission: EM | Admit: 2016-04-16 | Discharge: 2016-04-16 | Disposition: A | Discharge status: Home / Self Care | Payer: PPO | Attending: Emergency Medicine | Admitting: Emergency Medicine

## 2016-04-16 DIAGNOSIS — N189 Chronic kidney disease, unspecified: Secondary | ICD-10-CM | POA: Diagnosis not present

## 2016-04-16 DIAGNOSIS — Z79899 Other long term (current) drug therapy: Secondary | ICD-10-CM | POA: Diagnosis not present

## 2016-04-16 DIAGNOSIS — R040 Epistaxis: Secondary | ICD-10-CM | POA: Insufficient documentation

## 2016-04-16 DIAGNOSIS — Z8673 Personal history of transient ischemic attack (TIA), and cerebral infarction without residual deficits: Secondary | ICD-10-CM | POA: Insufficient documentation

## 2016-04-16 DIAGNOSIS — E1122 Type 2 diabetes mellitus with diabetic chronic kidney disease: Secondary | ICD-10-CM | POA: Insufficient documentation

## 2016-04-16 DIAGNOSIS — Z7984 Long term (current) use of oral hypoglycemic drugs: Secondary | ICD-10-CM | POA: Diagnosis not present

## 2016-04-16 DIAGNOSIS — Z87891 Personal history of nicotine dependence: Secondary | ICD-10-CM | POA: Diagnosis not present

## 2016-04-16 DIAGNOSIS — I129 Hypertensive chronic kidney disease with stage 1 through stage 4 chronic kidney disease, or unspecified chronic kidney disease: Secondary | ICD-10-CM | POA: Insufficient documentation

## 2016-04-16 LAB — CBC
HCT: 35.5 % — ABNORMAL LOW (ref 39.0–52.0)
Hemoglobin: 11.3 g/dL — ABNORMAL LOW (ref 13.0–17.0)
MCH: 24.7 pg — AB (ref 26.0–34.0)
MCHC: 31.8 g/dL (ref 30.0–36.0)
MCV: 77.7 fL — AB (ref 78.0–100.0)
PLATELETS: 207 10*3/uL (ref 150–400)
RBC: 4.57 MIL/uL (ref 4.22–5.81)
RDW: 15.6 % — AB (ref 11.5–15.5)
WBC: 6.2 10*3/uL (ref 4.0–10.5)

## 2016-04-16 LAB — PROTIME-INR
INR: 0.98
Prothrombin Time: 13 seconds (ref 11.4–15.2)

## 2016-04-16 MED ORDER — OXYMETAZOLINE HCL 0.05 % NA SOLN
1.0000 | Freq: Once | NASAL | Status: AC
  Administered 2016-04-16: 1 via NASAL
  Filled 2016-04-16: qty 15

## 2016-04-16 NOTE — Discharge Instructions (Signed)
You may use nasal saline to keep your mucous membranes moist. You may use a humidifier.  Other than nasal saline, please do not put anything into your nose for the next 3-4 days. Please do not blow your nose for the next 3-4 days. If your nose begins to bleed again, at this time it is okay to blow your nose and blow out all of the clots and then spray Afrin nasal spray into both nostrils and hold direct pressure for 30 minutes without stopping. If this does not stop the bleeding, please return to the hospital.  

## 2016-04-16 NOTE — ED Notes (Addendum)
Pt verbalized understanding of d/c instructions and has no further questions. Pt is stable, A&Ox4, VSS. Pt unable to sign due to computer not working at time of DC

## 2016-04-16 NOTE — ED Triage Notes (Signed)
Pt presents to ER for epistaxis x 2 today; pt states began at 10am and was able to stop first one but second bleed began 2345; pt continues to hold pressure in triage; pt denies sob at this time

## 2016-04-16 NOTE — ED Provider Notes (Signed)
TIME SEEN: 2:40 AM  CHIEF COMPLAINT: Nosebleed  HPI: Patient is a 66 year old [name] with history of hypertension, diabetes, dementia who presents emergency department with a nosebleed. States nosebleed started around 10 PM. Family at bedside reports at 11:30 PM they did not notice that the patient had a nosebleed. Patient reports a nosebleed came back this morning but he cannot tell me what time or how long it lasted. No interventions prior to arrival. Denies any recent head injury, facial trauma. Denies pain currently. Not on antiplatelets or anticoagulants. Nose stopped bleeding spontaneously in the waiting room.  ROS: See HPI Constitutional: no fever  Eyes: no drainage  ENT: no runny nose   Cardiovascular:  no chest pain  Resp: no SOB  GI: no vomiting GU: no dysuria Integumentary: no rash  Allergy: no hives  Musculoskeletal: no leg swelling  Neurological: no slurred speech ROS otherwise negative  PAST MEDICAL HISTORY/PAST SURGICAL HISTORY:  Past Medical History:  Diagnosis Date  . ANEMIA, NORMOCYTIC, CHRONIC 11/17/2007   Qualifier: Diagnosis of  By: Cruzita Lederer MD, Salena Saner    . Colon polyp 5/30/[name]   Colonoscopy and polypectomy by Dr. Deatra Ina revealed tubular adenoma and tubulovillous adenoma  . Dementia of frontal lobe type 12/17/2010  . Depression   . Diabetes mellitus   . GERD (gastroesophageal reflux disease)   . HYPERLIPIDEMIA 04/24/2006   Qualifier: Diagnosis of  By: Cruzita Lederer MD, Salena Saner    . Hypertension   . RENAL INSUFFICIENCY, CHRONIC 04/25/2006   Qualifier: Diagnosis of  By: Cruzita Lederer MD, Salena Saner    . Stroke [facility])    3 strokes (2004, 2006, 2007)    MEDICATIONS:  Prior to Admission medications   Medication Sig Start Date End Date Taking? Authorizing Provider  amitriptyline (ELAVIL) 50 MG tablet Take 1 tablet (50 mg total) by mouth at bedtime. 04/03/16   Norman Herrlich, MD  amLODipine (NORVASC) 10 MG tablet Take 1 tablet (10 mg total) by mouth daily. 04/03/16 04/03/17  Norman Herrlich, MD  atorvastatin (LIPITOR) 40 MG tablet Take 1 tablet (40 mg total) by mouth daily. 04/03/16 04/03/17  Norman Herrlich, MD  dipyridamole-aspirin (AGGRENOX) 200-25 MG 12hr capsule Take 1 capsule by mouth 2 (two) times daily. 04/09/16   Norman Herrlich, MD  hydrochlorothiazide (MICROZIDE) 12.5 MG capsule Take 1 capsule (12.5 mg total) by mouth daily. 04/03/16 04/03/17  Norman Herrlich, MD  losartan (COZAAR) 100 MG tablet Take 1 tablet (100 mg total) by mouth daily. 04/03/16   Norman Herrlich, MD  metFORMIN (GLUCOPHAGE) 500 MG tablet Take 1 tablet by mouth  twice a day with a meal 04/09/16   Norman Herrlich, MD  omeprazole (PRILOSEC) 20 MG capsule Take 1 capsule (20 mg total) by mouth 2 (two) times daily before a meal. 04/03/16   Norman Herrlich, MD  risperiDONE (RISPERDAL) 0.5 MG tablet Take 1 tablet (0.5 mg total) by mouth 2 (two) times daily. 04/09/16   Norman Herrlich, MD    ALLERGIES:  Allergies  Allergen Reactions  . Divalproex Sodium     REACTION: Rash    SOCIAL HISTORY:  Social History  Substance Use Topics  . Smoking status: Former Smoker    Quit date: 04/29/2005  . Smokeless tobacco: Never Used  . Alcohol use No    FAMILY HISTORY: Family History  Problem Relation Age of Onset  . Diabetes Maternal Grandmother   . Stroke Mother   . Cirrhosis Son   . Diabetes Maternal Grandfather   .  Hypertension Maternal Grandfather   . Colon cancer Neg Hx     EXAM: BP (!) 164/82 (BP Location: Left Arm)   Pulse 90   Temp 98.3 F (36.8 C) (Oral)   Resp (!) 22   SpO2 98%  CONSTITUTIONAL: Alert and oriented and responds appropriately to questions. Well-appearing; well-nourished, Elderly, in no distress HEAD: Normocephalic EYES: Conjunctivae clear, pupils appear equal, EOMI ENT: normal nose; moist mucous membranes, small amount of dried blood noted to the right nostril, no blood in the posterior oropharynx, no septal hematoma, normal phonation, no trismus or drooling NECK: Supple, no  meningismus, no nuchal rigidity, no LAD  CARD: RRR; S1 and S2 appreciated; no murmurs, no clicks, no rubs, no gallops RESP: Normal chest excursion without splinting or tachypnea; breath sounds clear and equal bilaterally; no wheezes, no rhonchi, no rales, no hypoxia or respiratory distress, speaking full sentences ABD/GI: Normal bowel sounds; non-distended; soft, non-tender, no rebound, no guarding, no peritoneal signs, no hepatosplenomegaly BACK:  The back appears normal and is non-tender to palpation, there is no CVA tenderness EXT: Normal ROM in all joints; non-tender to palpation; no edema; normal capillary refill; no cyanosis, no calf tenderness or swelling    SKIN: Normal color for age and race; warm; no rash NEURO: Moves all extremities equally PSYCH: The patient's mood and manner are appropriate. Grooming and personal hygiene are appropriate.  MEDICAL DECISION MAKING: Patient here with nosebleed that has spontaneously stopped. We have sprayed Afrin into both nostrils and will monitor him for another 30 minutes. Labs obtained in triage are unremarkable. Hemoglobin 11.3. He is hemodynamically stable.  ED PROGRESS: 4:00 AM  No further nosebleed.  Will dc home with ENT follow up. Discussed return precautions and supportive care instructions.   At this time, I do not feel there is any life-threatening condition present. I have reviewed and discussed all results (EKG, imaging, lab, urine as appropriate) and exam findings with patient/family. I have reviewed nursing notes and appropriate previous records.  I feel the patient is safe to be discharged home without further emergent workup and can continue workup as an outpatient as needed. Discussed usual and customary return precautions. Patient/family verbalize understanding and are comfortable with this plan.  Outpatient follow-up has been provided if needed. All questions have been answered.      [place], DO 04/16/16 [phone]

## 2016-04-17 ENCOUNTER — Other Ambulatory Visit: Payer: Self-pay | Admitting: Internal Medicine

## 2016-04-17 ENCOUNTER — Telehealth: Payer: Self-pay

## 2016-04-17 DIAGNOSIS — I639 Cerebral infarction, unspecified: Secondary | ICD-10-CM

## 2016-04-17 NOTE — Telephone Encounter (Signed)
Sure.  Dr. Hulen Luster

## 2016-05-02 ENCOUNTER — Ambulatory Visit: Payer: PPO

## 2016-05-10 ENCOUNTER — Telehealth: Payer: Self-pay

## 2016-05-13 NOTE — Addendum Note (Signed)
Addended by: Hulan Fray on: 05/13/2016 08:12 PM   Modules accepted: Orders

## 2016-06-25 ENCOUNTER — Encounter: Payer: Self-pay | Admitting: *Deleted

## 2016-07-05 ENCOUNTER — Encounter: Payer: Self-pay | Admitting: Podiatry

## 2016-07-05 ENCOUNTER — Ambulatory Visit (INDEPENDENT_AMBULATORY_CARE_PROVIDER_SITE_OTHER): Payer: PPO | Admitting: Podiatry

## 2016-07-05 DIAGNOSIS — B351 Tinea unguium: Secondary | ICD-10-CM

## 2016-07-05 DIAGNOSIS — M79676 Pain in unspecified toe(s): Secondary | ICD-10-CM | POA: Diagnosis not present

## 2016-07-05 DIAGNOSIS — E1140 Type 2 diabetes mellitus with diabetic neuropathy, unspecified: Secondary | ICD-10-CM

## 2016-07-05 NOTE — Progress Notes (Signed)
Patient ID: [name] [name], male   DOB: 1950-12-21, 66 y.o.   MRN: [id number] Complaint:  Visit Type: Patient returns to my office for continued preventative foot care services. Complaint: Patient states" my nails have grown long and thick and become painful to walk and wear shoes" Patient has been diagnosed with DM with neuropathy.Marland Kitchen He presents for preventative foot care services. No changes to ROS  Podiatric Exam: Vascular: dorsalis pedis and posterior tibial pulses are palpable bilateral. Capillary return is immediate. Temperature gradient is WNL. Skin turgor WNL  Sensorium: Diminished  Semmes Weinstein monofilament test. Normal tactile sensation bilaterally. Nail Exam: Pt has thick disfigured discolored nails with subungual debris noted bilateral entire nail hallux through fifth toenails Ulcer Exam: There is no evidence of ulcer or pre-ulcerative changes or infection. Orthopedic Exam: Muscle tone and strength are WNL. No limitations in general ROM. No crepitus or effusions noted. Foot type and digits show no abnormalities. Bony prominences are unremarkable. Skin: No Porokeratosis. No infection or ulcers  Diagnosis:  Tinea unguium, Pain in right toe, pain in left toes  Treatment & Plan Procedures and Treatment: Consent by patient was obtained for treatment procedures. The patient understood the discussion of treatment and procedures well. All questions were answered thoroughly reviewed. Debridement of mycotic and hypertrophic toenails, 1 through 5 bilateral and clearing of subungual debris. No ulceration, no infection noted.  Return Visit-Office Procedure: Patient instructed to return to the office for a follow up visit 3 months for continued evaluation and treatment.   Gardiner Barefoot DPM

## 2016-08-02 NOTE — Telephone Encounter (Signed)
opened telephone note in error

## 2016-08-02 NOTE — Telephone Encounter (Signed)
Note opened in error.

## 2016-10-04 ENCOUNTER — Ambulatory Visit: Payer: PPO | Admitting: Podiatry

## 2016-10-22 ENCOUNTER — Ambulatory Visit: Payer: PPO | Admitting: Internal Medicine

## 2016-11-07 ENCOUNTER — Encounter: Payer: PPO | Admitting: Internal Medicine

## 2016-11-18 ENCOUNTER — Ambulatory Visit (HOSPITAL_COMMUNITY)
Admission: RE | Admit: 2016-11-18 | Discharge: 2016-11-18 | Disposition: A | Discharge status: Home / Self Care | Payer: PPO | Source: Ambulatory Visit | Attending: Internal Medicine | Admitting: Internal Medicine

## 2016-11-18 ENCOUNTER — Ambulatory Visit (INDEPENDENT_AMBULATORY_CARE_PROVIDER_SITE_OTHER): Payer: PPO | Admitting: Internal Medicine

## 2016-11-18 VITALS — BP 104/69 | HR 92 | Temp 98.4°F | Ht 61.0 in | Wt 229.5 lb

## 2016-11-18 DIAGNOSIS — M545 Low back pain: Secondary | ICD-10-CM | POA: Diagnosis present

## 2016-11-18 DIAGNOSIS — R1031 Right lower quadrant pain: Secondary | ICD-10-CM

## 2016-11-18 DIAGNOSIS — M25551 Pain in right hip: Secondary | ICD-10-CM

## 2016-11-18 DIAGNOSIS — M5136 Other intervertebral disc degeneration, lumbar region: Secondary | ICD-10-CM | POA: Insufficient documentation

## 2016-11-18 DIAGNOSIS — M4807 Spinal stenosis, lumbosacral region: Secondary | ICD-10-CM | POA: Diagnosis not present

## 2016-11-18 HISTORY — DX: Pain in right hip: M25.551

## 2016-11-18 LAB — POCT URINALYSIS DIPSTICK
BILIRUBIN UA: NEGATIVE
Blood, UA: NEGATIVE
Glucose, UA: NEGATIVE
KETONES UA: NEGATIVE
LEUKOCYTES UA: NEGATIVE
Nitrite, UA: NEGATIVE
Spec Grav, UA: 1.025 (ref 1.010–1.025)
Urobilinogen, UA: 0.2 E.U./dL
pH, UA: 5.5 (ref 5.0–8.0)

## 2016-11-18 MED ORDER — ACETAMINOPHEN-CODEINE #3 300-30 MG PO TABS: 1.0000 | ORAL_TABLET | Freq: Three times a day (TID) | ORAL | 0 refills | Status: AC | PRN

## 2016-11-18 NOTE — Assessment & Plan Note (Signed)
Assessment: See HPI for additional details. Given the suprapubic tenderness, right flank pain and right thigh pain concern for fracture, renal stone, UTI/pyelo as well as bursitis/sciatic pain is of concern.  Plan: -UA dipstick for analysis of urine -

## 2016-11-18 NOTE — Progress Notes (Signed)
Internal Medicine Clinic Attending  I saw and evaluated the patient.  I personally confirmed the key portions of the history and exam documented by Dr. Berline Lopes and I reviewed pertinent patient test results.  The assessment, diagnosis, and plan were formulated together and I agree with the documentation in the resident's note.  UA by dipstick today did not show any blood in urine.  Other differential included spontaneous fracture vs. MSK complaint.  Dr. Berline Lopes ordered xrays of back and hip which should be done today.  Pain control with Tylenol #3

## 2016-11-18 NOTE — Patient Instructions (Signed)
Please complete the Xray imaging of your back today.  I have given you a script for tylenol #3 to be taken as needed for pain. You may take less than the prescribed dose per day if able.   Please call us on Friday if your symptoms have not improved and schedule for a follow-up appointment if needed.  Thank you for your visit to the [place] clinic today.

## 2016-11-18 NOTE — Progress Notes (Signed)
   CC: "right hip pain shooting down his leg"  HPI:  [name] is a 66 y.o. presents with right sided hip/leg pain that radiates down to his foot. He stated that he was lying in bed when this occurred and denies trauma. Wife stated that the patient reported he had fallen approximately one month prior. Made better by tylenol and rest. Denied numbness or tingling. No loss of bowel or bladder control. Pain does radiate to his thigh. Worse with activity and without amitriptyline, does not exercise. As per spouse, patient will not leave bed most of the day unless it is to go to [place] and return home. Will walk to the mailbox only as per wife. Denied dysuria, hematuria, loss of bowel or bladder control, denied weakness in the right leg. Has residual left sided weakness of the upper and lower extremities.   Past Medical History:  Diagnosis Date  . ANEMIA, NORMOCYTIC, CHRONIC 11/17/2007   Qualifier: Diagnosis of  By: Cruzita Lederer MD, Salena Saner    . Colon polyp 06/12/12   Colonoscopy and polypectomy by Dr. Deatra Ina revealed tubular adenoma and tubulovillous adenoma  . Dementia of frontal lobe type 12/17/2010  . Depression   . Diabetes mellitus   . GERD (gastroesophageal reflux disease)   . HYPERLIPIDEMIA 04/24/2006   Qualifier: Diagnosis of  By: Cruzita Lederer MD, Salena Saner    . Hypertension   . RENAL INSUFFICIENCY, CHRONIC 04/25/2006   Qualifier: Diagnosis of  By: Cruzita Lederer MD, Salena Saner    . Stroke [facility])    3 strokes (2004, 2006, 2007)   Review of Systems:  ROS negative except as per HPI.  Physical Exam:  Vitals:   11/18/16 1109  BP: 104/69  Pulse: 92  Temp: 98.4 F (36.9 C)  TempSrc: Oral  SpO2: 100%  Weight: 229 lb 8 oz (104.1 kg)  Height: 5\' 1"  (1.549 m)   Physical Exam  Constitutional: He appears well-developed and well-nourished. No distress.  Cardiovascular: Normal rate.  Pulmonary/Chest: Effort normal and breath sounds normal.  Abdominal: Soft. There is tenderness (overlying the lower  quadrants).  Musculoskeletal: He exhibits tenderness (mild, general, no point tenderness to the right flank).  Pain with straight leg raise on the right.  Neurological: He is alert.  Skin: Skin is warm. Capillary refill takes less than 2 seconds.    Assessment & Plan:   See Encounters Tab for problem based charting.  Patient seen with Dr. Daryll Drown

## 2016-11-19 NOTE — Progress Notes (Signed)
Called patient to inform him of the results of his imaging. Patients wife stated that he was feeling much better. I informed her that the imaging of the hip was unremarkable, no changes from the recent images, no fractures. She was content with the information and thanked me for the call.

## 2017-01-08 ENCOUNTER — Other Ambulatory Visit: Payer: Self-pay

## 2017-01-08 NOTE — Patient Outreach (Signed)
[facility]) Care Management  01/08/2017  [name] [name] 07/23/1950 [id number]   Referral Date: 01/08/17 Referral Source: [facility] Referral Reason: Care coordination   Outreach Attempt: Incoming call from wife Earlie Server who is primary caregiver for patient.  She is able to verify HIPAA.  Discussed with her nurse practitioner visit.  She reports that she has to do everything for the patient and just needs some help.  She states that it is taking a toll on her now.  She is not looking for placement but just some help in the home.  She states they applied for medicaid in about 2009 but did not qualify.  Explained to her that they maybe some out of pocket resources that she can pay for.  She verbalized understanding.  She also describe patient as being very deconditioned.  Advised her that she can discuss possible PT and OT referrals with primary care physician.  She verbalized understanding.    Social: Patient lives with wife who is primary caregiver.     Conditions: Patient had a previous stroke with left sided hemiparesis.  Patient also has DM and HTN, which wife states is well controlled.   Medications:  Wife gives patient all his medications and has no questions or concerns.     Appointments: Patient to see primary doctor next month   Consent: Discussed [facility].  Wife declined nurse and pharmacist.  However, wife in agreement for services for Social work to help with care resources.     Plan: RN CM will refer to social work for care resources.   RN CM will sign off at this time.   Jone Baseman, RN, MSN [facility] Care Management Care Management Coordinator Direct Line [phone] Toll Free: [phone]  Fax: [phone]

## 2017-01-08 NOTE — Patient Outreach (Signed)
[facility]) Care Management  01/08/2017  [name] [name] 01/27/1950 [id number]   Telephone call to patient for [facility] referral. No answer.  HIPAA compliant voice message left.    Plan: RN CM will attempt patient again within 10 business days.    Jone Baseman, RN, MSN [facility] Care Management Care Management Coordinator Direct Line [phone] Toll Free: [phone]  Fax: [phone]

## 2017-01-13 ENCOUNTER — Encounter: Payer: Self-pay | Admitting: *Deleted

## 2017-01-15 NOTE — Telephone Encounter (Signed)
This encounter was created in error - please disregard.

## 2017-01-21 ENCOUNTER — Other Ambulatory Visit: Payer: Self-pay | Admitting: *Deleted

## 2017-01-22 NOTE — Patient Outreach (Signed)
[place] [facility]) Care Management  01/22/2017  [name] [name] August 20, 1950 [id number]   Patient referred to this social worker by [place]  to provide community resources for in home assistance. This Education officer, museum spoike with patient's spouse, who reports tht she is the only person that patient will allow to help him but she is finding that she needs help. Patient's spouse is not willing to consider out of home placement but would like to discuss options for in home help at least 1-2x per week. She would also like help with getting patient a pair of diabetic shoes.  This Education officer, museum discussed out of pocket options for in home care s well as discussing the need for diabetic shoes with patient's podiatrist. Per patient's spouse, he has an appointment this week with the doctor and will discuss issue then.   Plan: This Education officer, museum will research any possible low cost in home help options and will follow up with patient's spouse within 1 week.   [name] [facility] Care Management [phone]

## 2017-01-29 ENCOUNTER — Other Ambulatory Visit: Payer: Self-pay | Admitting: *Deleted

## 2017-01-30 NOTE — Patient Outreach (Signed)
[place] [facility]) Care Management  01/30/2017  [name] [name] 04/12/1950 [id number]   Phone call to patient's spouse to follow up on possible referrals for low cost in home/respite care. Private duty in home aids reviewed as well as programs offered through [employer]. Apply for the Community Alternatives Program through the Department of Social Services also discussed. Patient's spouse needed to end the conversation prematurely and stated that she would call this social worker back on 01/30/17 to further discuss available resources.   Plan: This Education officer, museum to follow up with patient's spouse within 1 week to complete review of available resources.   [name] [facility] Care Management [phone]

## 2017-02-10 ENCOUNTER — Other Ambulatory Visit: Payer: Self-pay | Admitting: *Deleted

## 2017-02-10 NOTE — Patient Outreach (Addendum)
[place] [facility]) Care Management  02/10/2017  [name] [name] [birth year] [id number]   Phone call to patient's wife to follow up with her regarding in home assistance for her spouse after brief discussion last week. Patient's spouse did not answer,  HIPPA compliant voicemail message left for return call.   [name] [facility] Care Management [phone]

## 2017-02-11 NOTE — Patient Outreach (Addendum)
[place] [facility]) Care Management  02/11/2017  RENE SIZELOVE 1950/04/29 [id number]   Follow up phone call to patient's spouse, following a brief review of community resources to assist her with in home care last week. Respite care options as well as adult day care options through [place] discussed as well as the PACE-all inclusive program. The Community Alternatives Program(CAP) also discussed as well as private pay options for in home care. Patient 's spouse, states that she is aware of these programs and offerings, however she does not qualify for Medicaid and does not have the money to pay out of pocket. Per patient's spouse, her family and friends support is very limited. Per patient's spouse, her family/friends won't help unless they are paid.  "I just need somebody to help me clean up around here".  This Education officer, museum provided patient's spouse with emotional support, reiterated the engagement of family and friends for support. Discussed reconsidering private duty aid care 1 to 2 times per week for self care.This social workers contact information provided if there are future needs that arise. Patient to be closed to social work at this time.    [place], [place] [facility] [place], [place] Care Management [phone]

## 2017-02-14 DIAGNOSIS — E119 Type 2 diabetes mellitus without complications: Secondary | ICD-10-CM | POA: Diagnosis not present

## 2017-02-14 DIAGNOSIS — H538 Other visual disturbances: Secondary | ICD-10-CM | POA: Diagnosis not present

## 2017-02-14 DIAGNOSIS — H2511 Age-related nuclear cataract, right eye: Secondary | ICD-10-CM | POA: Diagnosis not present

## 2017-02-14 LAB — HM DIABETES EYE EXAM

## 2017-02-25 ENCOUNTER — Ambulatory Visit: Payer: PPO | Admitting: Podiatry

## 2017-02-25 ENCOUNTER — Ambulatory Visit (INDEPENDENT_AMBULATORY_CARE_PROVIDER_SITE_OTHER): Payer: PPO | Admitting: Internal Medicine

## 2017-02-25 ENCOUNTER — Ambulatory Visit: Payer: PPO | Admitting: Internal Medicine

## 2017-02-25 ENCOUNTER — Encounter: Payer: Self-pay | Admitting: Internal Medicine

## 2017-02-25 VITALS — BP 136/70 | HR 90 | Temp 98.0°F | Ht 61.0 in | Wt 236.8 lb

## 2017-02-25 DIAGNOSIS — D120 Benign neoplasm of cecum: Secondary | ICD-10-CM | POA: Diagnosis not present

## 2017-02-25 DIAGNOSIS — Z23 Encounter for immunization: Secondary | ICD-10-CM | POA: Diagnosis not present

## 2017-02-25 DIAGNOSIS — I10 Essential (primary) hypertension: Secondary | ICD-10-CM | POA: Diagnosis not present

## 2017-02-25 DIAGNOSIS — I129 Hypertensive chronic kidney disease with stage 1 through stage 4 chronic kidney disease, or unspecified chronic kidney disease: Secondary | ICD-10-CM

## 2017-02-25 DIAGNOSIS — E1122 Type 2 diabetes mellitus with diabetic chronic kidney disease: Secondary | ICD-10-CM

## 2017-02-25 DIAGNOSIS — E785 Hyperlipidemia, unspecified: Secondary | ICD-10-CM

## 2017-02-25 DIAGNOSIS — Z79899 Other long term (current) drug therapy: Secondary | ICD-10-CM | POA: Diagnosis not present

## 2017-02-25 DIAGNOSIS — Z0000 Encounter for general adult medical examination without abnormal findings: Secondary | ICD-10-CM

## 2017-02-25 DIAGNOSIS — F339 Major depressive disorder, recurrent, unspecified: Secondary | ICD-10-CM | POA: Diagnosis not present

## 2017-02-25 DIAGNOSIS — I6930 Unspecified sequelae of cerebral infarction: Secondary | ICD-10-CM

## 2017-02-25 DIAGNOSIS — D649 Anemia, unspecified: Secondary | ICD-10-CM

## 2017-02-25 DIAGNOSIS — E119 Type 2 diabetes mellitus without complications: Secondary | ICD-10-CM | POA: Diagnosis not present

## 2017-02-25 DIAGNOSIS — F32A Depression, unspecified: Secondary | ICD-10-CM

## 2017-02-25 DIAGNOSIS — Z7984 Long term (current) use of oral hypoglycemic drugs: Secondary | ICD-10-CM

## 2017-02-25 DIAGNOSIS — K219 Gastro-esophageal reflux disease without esophagitis: Secondary | ICD-10-CM

## 2017-02-25 DIAGNOSIS — F329 Major depressive disorder, single episode, unspecified: Secondary | ICD-10-CM

## 2017-02-25 DIAGNOSIS — N182 Chronic kidney disease, stage 2 (mild): Secondary | ICD-10-CM | POA: Diagnosis not present

## 2017-02-25 DIAGNOSIS — D126 Benign neoplasm of colon, unspecified: Secondary | ICD-10-CM

## 2017-02-25 LAB — POCT GLYCOSYLATED HEMOGLOBIN (HGB A1C): HEMOGLOBIN A1C: 6.5

## 2017-02-25 LAB — GLUCOSE, CAPILLARY: GLUCOSE-CAPILLARY: 104 mg/dL — AB (ref 65–99)

## 2017-02-25 NOTE — Progress Notes (Signed)
   Subjective:    Patient ID: [name] [name], male    DOB: [birth year], 67 y.o.   MRN: [id number]  HPI  I seen and examined this patient.  Patient is here for routine follow-up of his diabetes and hypertension.  Patient denies any new complaints at this time and states he is compliant with all his medications. Patient's wife does say that he is not compliant with his diet and goes to [employer] every day to eat.  Patient feels well otherwise.   Review of Systems  Constitutional: Negative.   HENT: Negative.   Respiratory: Negative.   Cardiovascular: Negative.   Gastrointestinal: Negative.   Musculoskeletal: Negative.   Skin: Negative.   Neurological: Negative.   Psychiatric/Behavioral: Negative.        Objective:   Physical Exam  Constitutional: He is oriented to person, place, and time. He appears well-developed and well-nourished.  HENT:  Head: Normocephalic and atraumatic.  Mouth/Throat: No oropharyngeal exudate.  Neck: Neck supple.  Cardiovascular: Normal rate, regular rhythm and normal heart sounds.  Pulmonary/Chest: Effort normal and breath sounds normal. He has no wheezes. He has no rales.  Abdominal: Soft. Bowel sounds are normal. He exhibits no distension. There is no tenderness.  Musculoskeletal: Normal range of motion. He exhibits no edema.  Lymphadenopathy:    He has no cervical adenopathy.  Neurological: He is alert and oriented to person, place, and time.  Psychiatric: He has a normal mood and affect. His behavior is normal.          Assessment & Plan:  Please see problem based charting for assessment and plan:

## 2017-02-25 NOTE — Assessment & Plan Note (Signed)
-  We will give patient flu shot today -We will check hepatitis C and HIV -Patient is due for colonoscopy in June.  He was reminded of this today.

## 2017-02-25 NOTE — Assessment & Plan Note (Signed)
-  This problem is chronic and stable -Patient states that his symptoms are well controlled on omeprazole -We will continue with this for now -Would consider trying to taper him off of this to see if he needs to be on this medication long-term

## 2017-02-25 NOTE — Assessment & Plan Note (Signed)
-  This problem is chronic and stable -Patient is on high intensity statin given his history of diabetes, hypertension and CVA -We will continue with Lipitor 40 mg for now -We will check a lipid profile today

## 2017-02-25 NOTE — Assessment & Plan Note (Signed)
-  This problem is chronic and stable -We will continue with high intensity statin and Aggrenox -No further workup for now

## 2017-02-25 NOTE — Assessment & Plan Note (Signed)
-  Patient had a colonoscopy in 2016 which showed 3 sessile polyps in his cecum which were removed -Pathology revealed no high-grade dysplasia or malignancy -He is due for repeat colonoscopy this June with Dr. Deatra Ina -Instructed patient to call me if he needs another referral to gastroenterology in June for his colonoscopy

## 2017-02-25 NOTE — Assessment & Plan Note (Signed)
-  This problem is chronic and stable -Will check a BMP today -Patient denies any urinary complaints at this time

## 2017-02-25 NOTE — Patient Instructions (Signed)
-   It was a pleasure seeing you today -We will check some blood work on you today -Your diabetes is well controlled.  Keep up the great work -Your hypertension is well controlled.  Please continue taking your medications -We will give you the flu shot today -We will give you a pneumonia shot on your follow-up visit -You are due for repeat colonoscopy in June with your gastroenterologist.  Please contact me if they do not call you for this appointment and I will refer you to them for follow-up.

## 2017-02-25 NOTE — Assessment & Plan Note (Signed)
-  This problem is chronic and stable -We will check a CBC today

## 2017-02-25 NOTE — Assessment & Plan Note (Signed)
BP Readings from Last 3 Encounters:  02/25/17 136/70  11/18/16 104/69  04/16/16 (!) 150/85    Lab Results  Component Value Date   NA 143 03/23/2015   K 4.0 03/23/2015   CREATININE 1.41 (H) 03/23/2015    Assessment: Blood pressure control:  Well-controlled  Progress toward BP goal:   At goal  Comments: Patient is compliant with losartan 100 mg, Norvasc 10 mg and hydrochlorthiazide 12.5 mg  Plan: Medications:  continue current medications Educational resources provided: brochure(denies need ) Self management tools provided:   Other plans: We will check BMP today

## 2017-02-25 NOTE — Assessment & Plan Note (Signed)
-  This problem is chronic and stable -Patient symptoms are well controlled on Risperdal and amitriptyline -We will check a CBC today given the patient is on Risperdal which can cause agranulocytosis and neutropenia -We will discuss this on follow-up visit to see if he needs to continue these medications long-term or needs to follow-up with a psychiatrist given that Risperdal is typically not used for depression alone

## 2017-02-25 NOTE — Assessment & Plan Note (Signed)
Lab Results  Component Value Date   HGBA1C 6.5 02/25/2017   HGBA1C 6.5 04/03/2016   HGBA1C 6.8 11/22/2015     Assessment: Diabetes control:  Well-controlled Progress toward A1C goal:   At goal Comments: Patient is compliant with metformin 500 mg twice daily  Plan: Medications:  continue current medications Home glucose monitoring: Frequency:   Timing:   Instruction/counseling given: reminded to get eye exam, discussed foot care and discussed diet Educational resources provided: brochure(denies need ) Self management tools provided:   Other plans: We will check BMP today, patient will follow up with podiatry today, patient saw ophthalmology last week (will obtain records from them)

## 2017-02-26 ENCOUNTER — Telehealth: Payer: Self-pay | Admitting: Internal Medicine

## 2017-02-26 LAB — CBC WITH DIFFERENTIAL/PLATELET
BASOS: 0 %
Basophils Absolute: 0 10*3/uL (ref 0.0–0.2)
EOS (ABSOLUTE): 0.1 10*3/uL (ref 0.0–0.4)
EOS: 1 %
Hematocrit: 35.2 % — ABNORMAL LOW (ref 37.5–51.0)
Hemoglobin: 10.9 g/dL — ABNORMAL LOW (ref 13.0–17.7)
IMMATURE GRANS (ABS): 0 10*3/uL (ref 0.0–0.1)
IMMATURE GRANULOCYTES: 0 %
LYMPHS ABS: 2.1 10*3/uL (ref 0.7–3.1)
Lymphs: 39 %
MCH: 25.1 pg — AB (ref 26.6–33.0)
MCHC: 31 g/dL — ABNORMAL LOW (ref 31.5–35.7)
MCV: 81 fL (ref 79–97)
MONOCYTES: 10 %
MONOS ABS: 0.5 10*3/uL (ref 0.1–0.9)
NEUTROS ABS: 2.6 10*3/uL (ref 1.4–7.0)
Neutrophils: 50 %
PLATELETS: 218 10*3/uL (ref 150–379)
RBC: 4.35 x10E6/uL (ref 4.14–5.80)
RDW: 17.5 % — ABNORMAL HIGH (ref 12.3–15.4)
WBC: 5.4 10*3/uL (ref 3.4–10.8)

## 2017-02-26 LAB — LIPID PANEL
CHOL/HDL RATIO: 3.6 ratio (ref 0.0–5.0)
CHOLESTEROL TOTAL: 158 mg/dL (ref 100–199)
HDL: 44 mg/dL (ref >39)
LDL CALC: 80 mg/dL (ref 0–99)
TRIGLYCERIDES: 172 mg/dL — AB (ref 0–149)
VLDL CHOLESTEROL CAL: 34 mg/dL (ref 5–40)

## 2017-02-26 LAB — BMP8+ANION GAP
ANION GAP: 17 mmol/L (ref 10.0–18.0)
BUN/Creatinine Ratio: 9 — ABNORMAL LOW (ref 10–24)
BUN: 14 mg/dL (ref 8–27)
CALCIUM: 9.2 mg/dL (ref 8.6–10.2)
CO2: 26 mmol/L (ref 20–29)
CREATININE: 1.53 mg/dL — AB (ref 0.76–1.27)
Chloride: 103 mmol/L (ref 96–106)
GFR calc Af Amer: 54 mL/min/{1.73_m2} — ABNORMAL LOW (ref >59)
GFR, EST NON AFRICAN AMERICAN: 47 mL/min/{1.73_m2} — AB (ref >59)
Glucose: 101 mg/dL — ABNORMAL HIGH (ref 65–99)
Potassium: 3.9 mmol/L (ref 3.5–5.2)
SODIUM: 146 mmol/L — AB (ref 134–144)

## 2017-02-26 LAB — HEPATITIS C ANTIBODY: Hep C Virus Ab: <0.1 s/co ratio (ref 0.0–0.9)

## 2017-02-26 LAB — HIV ANTIBODY (ROUTINE TESTING W REFLEX): HIV Screen 4th Generation wRfx: NONREACTIVE

## 2017-02-26 NOTE — Telephone Encounter (Addendum)
I called the patient to discuss the results of his blood work.  Patient was noted to have a mildly elevated sodium with his creatinine at baseline on his BMP.  I suspect that his mildly elevated sodium secondary to mild dehydration.  I encouraged the patient to increase his water intake.  His HIV and hepatitis C antibodies were negative.  His cholesterol panel was similar to his baseline and he is on high intensity statin.  Patient was also noted to have a mild normocytic anemia with his hemoglobin close to his baseline.  We will monitor closely for now.  Patient expresses understanding and is in agreement with plan.

## 2017-02-26 NOTE — Telephone Encounter (Signed)
I called the patient discuss his lab work with him.  Please see my telephone encounter from today where documented my discussion with him.

## 2017-03-18 ENCOUNTER — Ambulatory Visit: Payer: PPO | Admitting: Podiatry

## 2017-03-18 ENCOUNTER — Encounter: Payer: Self-pay | Admitting: Podiatry

## 2017-03-18 DIAGNOSIS — B351 Tinea unguium: Secondary | ICD-10-CM | POA: Diagnosis not present

## 2017-03-18 DIAGNOSIS — M79676 Pain in unspecified toe(s): Secondary | ICD-10-CM

## 2017-03-18 DIAGNOSIS — M2011 Hallux valgus (acquired), right foot: Secondary | ICD-10-CM

## 2017-03-18 DIAGNOSIS — E1140 Type 2 diabetes mellitus with diabetic neuropathy, unspecified: Secondary | ICD-10-CM | POA: Diagnosis not present

## 2017-03-18 DIAGNOSIS — M2012 Hallux valgus (acquired), left foot: Secondary | ICD-10-CM

## 2017-03-18 NOTE — Progress Notes (Addendum)
Patient ID: [name] [name], male   DOB: Feb 24, 1950, 67 y.o.   MRN: [id number] Complaint:  Visit Type: Patient returns to my office for continued preventative foot care services. Complaint: Patient states" my nails have grown long and thick and become painful to walk and wear shoes" Patient has been diagnosed with DM with neuropathy.Marland Kitchen He presents for preventative foot care services. No changes to ROS  Podiatric Exam: Vascular: dorsalis pedis and posterior tibial pulses are palpable bilateral. Capillary return is immediate. Temperature gradient is WNL. Skin turgor WNL  Sensorium: Diminished  Semmes Weinstein monofilament test. Normal tactile sensation bilaterally. Nail Exam: Pt has thick disfigured discolored nails with subungual debris noted bilateral entire nail hallux through fifth toenails Ulcer Exam: There is no evidence of ulcer or pre-ulcerative changes or infection. Orthopedic Exam: Muscle tone and strength are WNL. No limitations in general ROM. No crepitus or effusions noted. Foot type and digits show no abnormalities. HAV  B/L. Skin: No Porokeratosis. No infection or ulcers  Diagnosis:  Tinea unguium, Pain in right toe, pain in left toes  Treatment & Plan Procedures and Treatment: Consent by patient was obtained for treatment procedures. The patient understood the discussion of treatment and procedures well. All questions were answered thoroughly reviewed. Debridement of mycotic and hypertrophic toenails, 1 through 5 bilateral and clearing of subungual debris. No ulceration, no infection noted. Initiate diabetic footwear for DPN and HAV  B/L. Patient to make an appointment with [facility]. Return Visit-Office Procedure: Patient instructed to return to the office for a follow up visit 3 months for continued evaluation and treatment.   Gardiner Barefoot DPM

## 2017-03-31 ENCOUNTER — Telehealth: Payer: Self-pay | Admitting: Podiatry

## 2017-03-31 NOTE — Telephone Encounter (Signed)
Called to get pt scheduled for diabetic shoe measurements but mailbox is full.

## 2017-04-21 ENCOUNTER — Telehealth: Payer: Self-pay | Admitting: Podiatry

## 2017-04-21 NOTE — Telephone Encounter (Signed)
Mailbox full

## 2017-04-25 ENCOUNTER — Other Ambulatory Visit: Payer: Self-pay

## 2017-04-25 DIAGNOSIS — Z0000 Encounter for general adult medical examination without abnormal findings: Secondary | ICD-10-CM

## 2017-04-25 DIAGNOSIS — F329 Major depressive disorder, single episode, unspecified: Secondary | ICD-10-CM

## 2017-04-25 DIAGNOSIS — F32A Depression, unspecified: Secondary | ICD-10-CM

## 2017-04-25 MED ORDER — ASPIRIN-DIPYRIDAMOLE ER 25-200 MG PO CP12: 1.0000 | ORAL_CAPSULE | Freq: Two times a day (BID) | ORAL | Status: DC

## 2017-04-25 MED ORDER — AMITRIPTYLINE HCL 50 MG PO TABS: 50.0000 mg | ORAL_TABLET | Freq: Every day | ORAL | 1 refills | Status: DC

## 2017-04-25 NOTE — Telephone Encounter (Signed)
  amitriptyline (ELAVIL) 50 MG tablet   dipyridamole-aspirin (AGGRENOX) 200-25 MG 12hr capsule   REFILL REQUEST @ [facility] [address].

## 2017-04-25 NOTE — Telephone Encounter (Signed)
CALLED PHARM AFTER SPOUSE CALLED C/O NOT GETTING scripts fast enough, she just requested refills this am, pharmacist states that they did not receive aggrenox refill, gave to him verbally, amitriptyline was rec'd and settin in que to fill. Spouse was informed she may pick up this pm

## 2017-04-28 ENCOUNTER — Other Ambulatory Visit: Payer: Self-pay

## 2017-04-28 DIAGNOSIS — K219 Gastro-esophageal reflux disease without esophagitis: Secondary | ICD-10-CM

## 2017-04-28 DIAGNOSIS — E119 Type 2 diabetes mellitus without complications: Secondary | ICD-10-CM

## 2017-04-28 DIAGNOSIS — E785 Hyperlipidemia, unspecified: Secondary | ICD-10-CM

## 2017-04-28 DIAGNOSIS — I10 Essential (primary) hypertension: Secondary | ICD-10-CM

## 2017-04-28 NOTE — Telephone Encounter (Signed)
omeprazole (PRILOSEC) 20 MG capsule  losartan (COZAAR) 100 MG tablet  atorvastatin (LIPITOR) 40 MG tablet(Expired)  amLODipine (NORVASC) 10 MG tablet(Expired)  hydrochlorothiazide (MICROZIDE) 12.5 MG capsule(Expired)  metFORMIN (GLUCOPHAGE) 500 MG tablet,  Refill request @ [facility].

## 2017-04-29 MED ORDER — AMLODIPINE BESYLATE 10 MG PO TABS: 10.0000 mg | ORAL_TABLET | Freq: Every day | ORAL | 3 refills | Status: DC

## 2017-04-29 MED ORDER — OMEPRAZOLE 20 MG PO CPDR: 20.0000 mg | DELAYED_RELEASE_CAPSULE | Freq: Two times a day (BID) | ORAL | 3 refills | Status: DC

## 2017-04-29 MED ORDER — METFORMIN HCL 500 MG PO TABS: ORAL_TABLET | ORAL | 3 refills | Status: DC

## 2017-04-29 MED ORDER — LOSARTAN POTASSIUM 100 MG PO TABS: 100.0000 mg | ORAL_TABLET | Freq: Every day | ORAL | 3 refills | Status: DC

## 2017-04-29 MED ORDER — ATORVASTATIN CALCIUM 40 MG PO TABS: 40.0000 mg | ORAL_TABLET | Freq: Every day | ORAL | 3 refills | Status: DC

## 2017-04-29 MED ORDER — HYDROCHLOROTHIAZIDE 12.5 MG PO CAPS
12.5000 mg | ORAL_CAPSULE | Freq: Every day | ORAL | 11 refills | Status: DC
Start: 2017-04-29 — End: 2017-04-30

## 2017-04-29 NOTE — Telephone Encounter (Signed)
[facility] chosen for Metformin. VO for Metformin called to Bill at [facility]. Verified with [facility] all other Rx s sent today were received. Hubbard Hartshorn, RN, BSN

## 2017-04-30 ENCOUNTER — Other Ambulatory Visit: Payer: Self-pay

## 2017-04-30 MED ORDER — HYDROCHLOROTHIAZIDE 12.5 MG PO CAPS: 12.5000 mg | ORAL_CAPSULE | Freq: Every day | ORAL | 3 refills | Status: DC

## 2017-04-30 NOTE — Telephone Encounter (Signed)
[name] [name] with [name] requesting 90 days supply on hydrochlorothiazide (MICROZIDE) 12.5 MG capsule. Please call back.

## 2017-05-21 ENCOUNTER — Other Ambulatory Visit: Payer: Self-pay | Admitting: Internal Medicine

## 2017-06-24 ENCOUNTER — Encounter

## 2017-06-24 ENCOUNTER — Encounter: Payer: Self-pay | Admitting: Podiatry

## 2017-06-24 ENCOUNTER — Ambulatory Visit (INDEPENDENT_AMBULATORY_CARE_PROVIDER_SITE_OTHER): Payer: PPO | Admitting: Podiatry

## 2017-06-24 ENCOUNTER — Ambulatory Visit: Payer: PPO | Admitting: Podiatry

## 2017-06-24 DIAGNOSIS — M79676 Pain in unspecified toe(s): Principal | ICD-10-CM

## 2017-06-24 DIAGNOSIS — B351 Tinea unguium: Secondary | ICD-10-CM | POA: Diagnosis not present

## 2017-06-24 DIAGNOSIS — E1140 Type 2 diabetes mellitus with diabetic neuropathy, unspecified: Secondary | ICD-10-CM

## 2017-06-24 NOTE — Progress Notes (Signed)
Patient ID: [name] [name], male   DOB: 06/07/1950, 67 y.o.   MRN: [id number] Complaint:  Visit Type: Patient returns to my office for continued preventative foot care services. Complaint: Patient states" my nails have grown long and thick and become painful to walk and wear shoes" Patient has been diagnosed with DM with neuropathy.Marland Kitchen He presents for preventative foot care services. No changes to ROS  Podiatric Exam: Vascular: dorsalis pedis and posterior tibial pulses are palpable bilateral. Capillary return is immediate. Temperature gradient is WNL. Skin turgor WNL  Sensorium: Diminished  Semmes Weinstein monofilament test. Normal tactile sensation bilaterally. Nail Exam: Pt has thick disfigured discolored nails with subungual debris noted bilateral entire nail hallux through fifth toenails Ulcer Exam: There is no evidence of ulcer or pre-ulcerative changes or infection. Orthopedic Exam: Muscle tone and strength are WNL. No limitations in general ROM. No crepitus or effusions noted. Foot type and digits show no abnormalities. HAV  B/L. Skin: No Porokeratosis. No infection or ulcers  Diagnosis:  Onychomycosis  B/L., Pain in right toe, pain in left toes  Treatment & Plan Procedures and Treatment: Consent by patient was obtained for treatment procedures. The patient understood the discussion of treatment and procedures well. All questions were answered thoroughly reviewed. Debridement of mycotic and hypertrophic toenails, 1 through 5 bilateral and clearing of subungual debris. No ulceration, no infection noted. . Return Visit-Office Procedure: Patient instructed to return to the office for a follow up visit 3 months for continued evaluation and treatment.   Gardiner Barefoot DPM

## 2017-06-24 NOTE — Progress Notes (Signed)
Patient ID: [name] [name], male   DOB: September 23, 1950, 67 y.o.   MRN: [id number] Complaint:  Visit Type: Patient returns to my office for continued preventative foot care services. Complaint: Patient states" my nails have grown long and thick and become painful to walk and wear shoes" Patient has been diagnosed with DM with neuropathy.Marland Kitchen He presents for preventative foot care services. No changes to ROS  Podiatric Exam: Vascular: dorsalis pedis and posterior tibial pulses are palpable bilateral. Capillary return is immediate. Temperature gradient is WNL. Skin turgor WNL  Sensorium: Diminished  Semmes Weinstein monofilament test. Normal tactile sensation bilaterally. Nail Exam: Pt has thick disfigured discolored nails with subungual debris noted bilateral entire nail hallux through fifth toenails Ulcer Exam: There is no evidence of ulcer or pre-ulcerative changes or infection. Orthopedic Exam: Muscle tone and strength are WNL. No limitations in general ROM. No crepitus or effusions noted. Foot type and digits show no abnormalities. HAV  B/L. Skin: No Porokeratosis. No infection or ulcers  Diagnosis:  Onychomycosis     Pain in right toe, pain in left toes  Treatment & Plan Procedures and Treatment: Consent by patient was obtained for treatment procedures. The patient understood the discussion of treatment and procedures well. All questions were answered thoroughly reviewed. Debridement of mycotic and hypertrophic toenails, 1 through 5 bilateral and clearing of subungual debris. No ulceration, no infection noted.  Return Visit-Office Procedure: Patient instructed to return to the office for a follow up visit 3 months for continued evaluation and treatment.   Gardiner Barefoot DPM

## 2017-07-09 ENCOUNTER — Other Ambulatory Visit: Payer: Self-pay | Admitting: Internal Medicine

## 2017-07-09 DIAGNOSIS — Z0000 Encounter for general adult medical examination without abnormal findings: Secondary | ICD-10-CM

## 2017-07-09 DIAGNOSIS — F32A Depression, unspecified: Secondary | ICD-10-CM

## 2017-07-09 DIAGNOSIS — F329 Major depressive disorder, single episode, unspecified: Secondary | ICD-10-CM

## 2017-07-11 NOTE — Telephone Encounter (Signed)
Patient wife is calling back, patient refill on addrenox 200-25mg  and risperdal 0.5, pls send [facility]

## 2017-07-12 MED ORDER — RISPERIDONE 0.5 MG PO TABS: 0.5000 mg | ORAL_TABLET | Freq: Two times a day (BID) | ORAL | 3 refills | Status: DC

## 2017-07-12 MED ORDER — ASPIRIN-DIPYRIDAMOLE ER 25-200 MG PO CP12: 1.0000 | ORAL_CAPSULE | Freq: Two times a day (BID) | ORAL | 1 refills | Status: DC

## 2017-07-31 ENCOUNTER — Telehealth: Payer: Self-pay | Admitting: Podiatry

## 2017-07-31 NOTE — Telephone Encounter (Signed)
Pt left voicemail checking on status of diabetic shoes.   Returned call and left message his doctor has not signed off on the paperwork therefore the shoes are not back. The paperwork has been faxed electronically several times and just hand faxed it as well.

## 2017-09-12 ENCOUNTER — Telehealth: Payer: Self-pay | Admitting: Podiatry

## 2017-09-12 NOTE — Telephone Encounter (Signed)
Left message for pt that he needs to see Dr Bronwen Betters before 9.19  so she can sign paperwork for diabetic shoes and that pts insurance authorization expires 9.19.19

## 2017-09-23 ENCOUNTER — Encounter: Payer: Self-pay | Admitting: Podiatry

## 2017-09-23 ENCOUNTER — Ambulatory Visit: Payer: PPO | Admitting: Podiatry

## 2017-09-23 ENCOUNTER — Encounter: Payer: Self-pay | Admitting: Internal Medicine

## 2017-09-23 DIAGNOSIS — E1140 Type 2 diabetes mellitus with diabetic neuropathy, unspecified: Secondary | ICD-10-CM | POA: Diagnosis not present

## 2017-09-23 DIAGNOSIS — B351 Tinea unguium: Secondary | ICD-10-CM | POA: Diagnosis not present

## 2017-09-23 DIAGNOSIS — M79676 Pain in unspecified toe(s): Secondary | ICD-10-CM

## 2017-09-23 NOTE — Progress Notes (Signed)
Patient ID: [name] [name], male   DOB: 09/08/1950, 67 y.o.   MRN: [id number] Complaint:  Visit Type: Patient returns to my office for continued preventative foot care services. Complaint: Patient states" my nails have grown long and thick and become painful to walk and wear shoes" Patient has been diagnosed with DM with neuropathy.. He presents for preventative foot care services. No changes to ROS  Podiatric Exam: Vascular: dorsalis pedis and posterior tibial pulses are palpable bilateral. Capillary return is immediate. Temperature gradient is WNL. Skin turgor WNL  Sensorium: Diminished  Semmes Weinstein monofilament test. Normal tactile sensation bilaterally. Nail Exam: Pt has thick disfigured discolored nails with subungual debris noted bilateral entire nail hallux through fifth toenails Ulcer Exam: There is no evidence of ulcer or pre-ulcerative changes or infection. Orthopedic Exam: Muscle tone and strength are WNL. No limitations in general ROM. No crepitus or effusions noted. Foot type and digits show no abnormalities. HAV  B/L. Skin: No Porokeratosis. No infection or ulcers  Diagnosis:  Onychomycosis  B/L., Pain in right toe, pain in left toes  Treatment & Plan Procedures and Treatment: Consent by patient was obtained for treatment procedures. The patient understood the discussion of treatment and procedures well. All questions were answered thoroughly reviewed. Debridement of mycotic and hypertrophic toenails, 1 through 5 bilateral and clearing of subungual debris. No ulceration, no infection noted. . Return Visit-Office Procedure: Patient instructed to return to the office for a follow up visit 3 months for continued evaluation and treatment.   Layna Roeper DPM 

## 2017-09-30 ENCOUNTER — Encounter: Payer: Self-pay | Admitting: Internal Medicine

## 2017-09-30 ENCOUNTER — Other Ambulatory Visit: Payer: Self-pay

## 2017-09-30 ENCOUNTER — Ambulatory Visit (INDEPENDENT_AMBULATORY_CARE_PROVIDER_SITE_OTHER): Payer: PPO | Admitting: Internal Medicine

## 2017-09-30 VITALS — BP 135/67 | HR 89 | Temp 98.5°F | Ht 61.0 in | Wt 239.0 lb

## 2017-09-30 DIAGNOSIS — N182 Chronic kidney disease, stage 2 (mild): Secondary | ICD-10-CM

## 2017-09-30 DIAGNOSIS — E785 Hyperlipidemia, unspecified: Secondary | ICD-10-CM | POA: Diagnosis not present

## 2017-09-30 DIAGNOSIS — I129 Hypertensive chronic kidney disease with stage 1 through stage 4 chronic kidney disease, or unspecified chronic kidney disease: Secondary | ICD-10-CM | POA: Diagnosis not present

## 2017-09-30 DIAGNOSIS — I6930 Unspecified sequelae of cerebral infarction: Secondary | ICD-10-CM | POA: Diagnosis not present

## 2017-09-30 DIAGNOSIS — Z79899 Other long term (current) drug therapy: Secondary | ICD-10-CM

## 2017-09-30 DIAGNOSIS — K219 Gastro-esophageal reflux disease without esophagitis: Secondary | ICD-10-CM | POA: Diagnosis not present

## 2017-09-30 DIAGNOSIS — E1122 Type 2 diabetes mellitus with diabetic chronic kidney disease: Secondary | ICD-10-CM

## 2017-09-30 DIAGNOSIS — D649 Anemia, unspecified: Secondary | ICD-10-CM | POA: Diagnosis not present

## 2017-09-30 DIAGNOSIS — D126 Benign neoplasm of colon, unspecified: Secondary | ICD-10-CM

## 2017-09-30 DIAGNOSIS — D120 Benign neoplasm of cecum: Secondary | ICD-10-CM | POA: Diagnosis not present

## 2017-09-30 DIAGNOSIS — Z7984 Long term (current) use of oral hypoglycemic drugs: Secondary | ICD-10-CM | POA: Diagnosis not present

## 2017-09-30 DIAGNOSIS — F339 Major depressive disorder, recurrent, unspecified: Secondary | ICD-10-CM | POA: Diagnosis not present

## 2017-09-30 DIAGNOSIS — I10 Essential (primary) hypertension: Secondary | ICD-10-CM

## 2017-09-30 DIAGNOSIS — F32A Depression, unspecified: Secondary | ICD-10-CM

## 2017-09-30 DIAGNOSIS — E1169 Type 2 diabetes mellitus with other specified complication: Secondary | ICD-10-CM | POA: Diagnosis not present

## 2017-09-30 DIAGNOSIS — Z23 Encounter for immunization: Secondary | ICD-10-CM | POA: Diagnosis not present

## 2017-09-30 DIAGNOSIS — Z0000 Encounter for general adult medical examination without abnormal findings: Secondary | ICD-10-CM

## 2017-09-30 DIAGNOSIS — F329 Major depressive disorder, single episode, unspecified: Secondary | ICD-10-CM

## 2017-09-30 LAB — GLUCOSE, CAPILLARY: Glucose-Capillary: 89 mg/dL (ref 70–99)

## 2017-09-30 LAB — POCT GLYCOSYLATED HEMOGLOBIN (HGB A1C): HEMOGLOBIN A1C: 6.6 % — AB (ref 4.0–5.6)

## 2017-09-30 MED ORDER — RISPERIDONE 0.5 MG PO TABS: 0.5000 mg | ORAL_TABLET | Freq: Two times a day (BID) | ORAL | 3 refills | Status: DC

## 2017-09-30 MED ORDER — ATORVASTATIN CALCIUM 40 MG PO TABS: 40.0000 mg | ORAL_TABLET | Freq: Every day | ORAL | 3 refills | Status: DC

## 2017-09-30 MED ORDER — METFORMIN HCL 500 MG PO TABS: ORAL_TABLET | ORAL | 3 refills | Status: DC

## 2017-09-30 MED ORDER — ASPIRIN-DIPYRIDAMOLE ER 25-200 MG PO CP12
1.0000 | ORAL_CAPSULE | Freq: Two times a day (BID) | ORAL | 1 refills | Status: DC
Start: 2017-09-30 — End: 2018-01-27

## 2017-09-30 MED ORDER — AMLODIPINE BESYLATE 10 MG PO TABS
10.0000 mg | ORAL_TABLET | Freq: Every day | ORAL | 3 refills | Status: DC
Start: 2017-09-30 — End: 2018-08-12

## 2017-09-30 MED ORDER — OMEPRAZOLE 20 MG PO CPDR: 20.0000 mg | DELAYED_RELEASE_CAPSULE | Freq: Two times a day (BID) | ORAL | 3 refills | Status: DC

## 2017-09-30 MED ORDER — LOSARTAN POTASSIUM 100 MG PO TABS
100.0000 mg | ORAL_TABLET | Freq: Every day | ORAL | 3 refills | Status: DC
Start: 2017-09-30 — End: 2018-08-12

## 2017-09-30 MED ORDER — HYDROCHLOROTHIAZIDE 12.5 MG PO CAPS: 12.5000 mg | ORAL_CAPSULE | Freq: Every day | ORAL | 3 refills | Status: DC

## 2017-09-30 MED ORDER — AMITRIPTYLINE HCL 50 MG PO TABS: 50.0000 mg | ORAL_TABLET | Freq: Every day | ORAL | 1 refills | Status: DC

## 2017-09-30 NOTE — Assessment & Plan Note (Signed)
-  This problem is chronic and stable -Patient's PHQ 2 score today is 0 -We will continue with Risperdal and Elavil for now -Refilled these medications today

## 2017-09-30 NOTE — Assessment & Plan Note (Signed)
-  This problem is chronic and stable -Patient has had difficulty ambulating except for very short distances -Will obtain a home health PT referral to assess patient's needs regarding assistive devices while walking as well as to see if he would qualify for home health PT -We will refill Aggrenox today -No further work-up at this time

## 2017-09-30 NOTE — Assessment & Plan Note (Signed)
-  This problem is chronic and stable -Patient states that his symptoms are well controlled with Prilosec -No further work-up at this time -We will refill Prilosec today

## 2017-09-30 NOTE — Assessment & Plan Note (Signed)
-  This problem is chronic and stable -We will recheck a CBC today -Patient denies any fatigue or melena or hematemesis -No further work-up at this time

## 2017-09-30 NOTE — Assessment & Plan Note (Signed)
Flu shot given today

## 2017-09-30 NOTE — Assessment & Plan Note (Signed)
-  Patient was noted to have 3 sessile polyps in his cecum in 2016 during his colonoscopy -Patient denies any abdominal complaints at this time and states that his appetite has been good -Patient is due for repeat colonoscopy this year but has not yet followed up with Dr. Deatra Ina -I placed a referral to GI today for repeat colonoscopy

## 2017-09-30 NOTE — Assessment & Plan Note (Signed)
-  This problem is chronic and stable -Repeat A1c done today was 6.6 -Patient is compliant with metformin 500 mg twice daily -We will continue this for now -No further work-up at this time

## 2017-09-30 NOTE — Patient Instructions (Signed)
-  It was a pleasure seeing you today -Your diabetes is well controlled with an A1c of 6.6 -Your blood pressure is well controlled as well -I have refilled your medications today.  Please call me if you have any issues receiving these medications -I have put in referral for home physical therapy -I have also put in a referral to your gastroenterologist for a repeat colonoscopy -We will check some blood work today -Please call me if you have any questions

## 2017-09-30 NOTE — Assessment & Plan Note (Signed)
-  This problem is chronic and stable -Patient denies any urinary complaints today -We will follow-up with BMP today -No further work-up at this time

## 2017-09-30 NOTE — Assessment & Plan Note (Signed)
-  This problem is chronic and stable -We will continue with Lipitor 40 mg daily -We will refill this for him today -Patient denies any complaints with this medication -No further work-up at this time

## 2017-09-30 NOTE — Progress Notes (Signed)
   Subjective:    Patient ID: [name] [name], male    DOB: [birth year], 67 y.o.   MRN: [id number]  HPI  I have seen and examined this patient.  Patient is here for routine follow-up of his hypertension and diabetes.  Patient states that he feels well currently and has no new complaints.  Patient's wife states that patient has not been active and gets tired walking easily and usually just spends most of the day in bed or watching TV.  She states that he has some difficulty while walking and after walking a short distance becomes extremely tired.  Patient states that he is compliant with all his medications and denies any other complaints at this time.   Review of Systems  Constitutional: Negative.   HENT: Negative.   Respiratory: Negative.   Cardiovascular: Negative.   Gastrointestinal: Negative.   Musculoskeletal: Negative.   Neurological: Negative.  Negative for dizziness, syncope and light-headedness.  Psychiatric/Behavioral: Negative.        Objective:   Physical Exam  Constitutional: He is oriented to person, place, and time. He appears well-developed and well-nourished.  HENT:  Head: Normocephalic and atraumatic.  Mouth/Throat: Oropharynx is clear and moist. No oropharyngeal exudate.  Neck: Neck supple.  Cardiovascular: Normal rate, regular rhythm and normal heart sounds.  Pulmonary/Chest: Effort normal and breath sounds normal. No respiratory distress. He has no wheezes. He has no rales.  Abdominal: Soft. Bowel sounds are normal. He exhibits no distension. There is no tenderness.  Musculoskeletal: Normal range of motion. He exhibits no edema.  Lymphadenopathy:    He has no cervical adenopathy.  Neurological: He is alert and oriented to person, place, and time.  Psychiatric: He has a normal mood and affect. His behavior is normal.          Assessment & Plan:  Please see problem based charting for assessment and plan:

## 2017-09-30 NOTE — Assessment & Plan Note (Signed)
BP Readings from Last 3 Encounters:  09/30/17 135/67  02/25/17 136/70  11/18/16 104/69    Lab Results  Component Value Date   NA 146 (H) 02/25/2017   K 3.9 02/25/2017   CREATININE 1.53 (H) 02/25/2017    Assessment: Blood pressure control:  Well-controlled Progress toward BP goal:   At goal  comments: Patient is compliant with losartan 100 mg, HCTZ 12.5 mg, amlodipine 10 mg  Plan: Medications:  continue current medications Educational resources provided:   Self management tools provided:   Other plans: We will check BMP today

## 2017-10-01 LAB — BMP8+ANION GAP
ANION GAP: 20 mmol/L — AB (ref 10.0–18.0)
BUN/Creatinine Ratio: 8 — ABNORMAL LOW (ref 10–24)
BUN: 13 mg/dL (ref 8–27)
CALCIUM: 9.2 mg/dL (ref 8.6–10.2)
CO2: 22 mmol/L (ref 20–29)
CREATININE: 1.59 mg/dL — AB (ref 0.76–1.27)
Chloride: 104 mmol/L (ref 96–106)
GFR calc Af Amer: 52 mL/min/{1.73_m2} — ABNORMAL LOW (ref >59)
GFR calc non Af Amer: 45 mL/min/{1.73_m2} — ABNORMAL LOW (ref >59)
Glucose: 87 mg/dL (ref 65–99)
POTASSIUM: 4.1 mmol/L (ref 3.5–5.2)
SODIUM: 146 mmol/L — AB (ref 134–144)

## 2017-10-01 LAB — CBC WITH DIFFERENTIAL/PLATELET
Basophils Absolute: 0 10*3/uL (ref 0.0–0.2)
Basos: 0 %
EOS (ABSOLUTE): 0.1 10*3/uL (ref 0.0–0.4)
EOS: 1 %
Hematocrit: 35.1 % — ABNORMAL LOW (ref 37.5–51.0)
Hemoglobin: 11.2 g/dL — ABNORMAL LOW (ref 13.0–17.7)
IMMATURE GRANULOCYTES: 0 %
Immature Grans (Abs): 0 10*3/uL (ref 0.0–0.1)
LYMPHS: 38 %
Lymphocytes Absolute: 1.9 10*3/uL (ref 0.7–3.1)
MCH: 24.8 pg — ABNORMAL LOW (ref 26.6–33.0)
MCHC: 31.9 g/dL (ref 31.5–35.7)
MCV: 78 fL — ABNORMAL LOW (ref 79–97)
MONOS ABS: 0.4 10*3/uL (ref 0.1–0.9)
Monocytes: 8 %
NEUTROS PCT: 53 %
Neutrophils Absolute: 2.7 10*3/uL (ref 1.4–7.0)
PLATELETS: 229 10*3/uL (ref 150–450)
RBC: 4.51 x10E6/uL (ref 4.14–5.80)
RDW: 17.2 % — AB (ref 12.3–15.4)
WBC: 5.1 10*3/uL (ref 3.4–10.8)

## 2017-10-02 ENCOUNTER — Telehealth: Payer: Self-pay | Admitting: Internal Medicine

## 2017-10-02 NOTE — Telephone Encounter (Signed)
I called the patient to discuss results of his blood work with him.  His creatinine is stable at 1.59.  He is mildly hypernatremic with sodium of 146 and a mildly elevated anion gap of 20 likely secondary to his underlying CKD.  He also does have a chronic microcytic anemia with a stable hemoglobin.  Patient is scheduled to follow-up with GI for repeat colonoscopy.  We will check a repeat CBC with a ferritin and iron studies on his follow-up visit.  Patient expresses understanding and is in agreement with plan.  No further work-up at this time.

## 2017-10-09 DIAGNOSIS — F329 Major depressive disorder, single episode, unspecified: Secondary | ICD-10-CM | POA: Diagnosis not present

## 2017-10-09 DIAGNOSIS — N182 Chronic kidney disease, stage 2 (mild): Secondary | ICD-10-CM | POA: Diagnosis not present

## 2017-10-09 DIAGNOSIS — I10 Essential (primary) hypertension: Secondary | ICD-10-CM | POA: Diagnosis not present

## 2017-10-09 DIAGNOSIS — Z794 Long term (current) use of insulin: Secondary | ICD-10-CM | POA: Diagnosis not present

## 2017-10-09 DIAGNOSIS — E119 Type 2 diabetes mellitus without complications: Secondary | ICD-10-CM | POA: Diagnosis not present

## 2017-10-16 ENCOUNTER — Telehealth: Payer: Self-pay | Admitting: *Deleted

## 2017-10-16 DIAGNOSIS — E119 Type 2 diabetes mellitus without complications: Secondary | ICD-10-CM | POA: Diagnosis not present

## 2017-10-16 DIAGNOSIS — I10 Essential (primary) hypertension: Secondary | ICD-10-CM | POA: Diagnosis not present

## 2017-10-16 DIAGNOSIS — Z794 Long term (current) use of insulin: Secondary | ICD-10-CM | POA: Diagnosis not present

## 2017-10-16 DIAGNOSIS — F329 Major depressive disorder, single episode, unspecified: Secondary | ICD-10-CM | POA: Diagnosis not present

## 2017-10-16 DIAGNOSIS — N182 Chronic kidney disease, stage 2 (mild): Secondary | ICD-10-CM | POA: Diagnosis not present

## 2017-10-16 NOTE — Telephone Encounter (Signed)
VO given to Mel, PT [facility] for speech therapist per Dr Dareen Piano.

## 2017-10-16 NOTE — Telephone Encounter (Signed)
Call from [facility], PT at [facility] - requesting verbal order for "Speech Therapist". Will this be ok?

## 2017-10-16 NOTE — Telephone Encounter (Signed)
I agree. Thank you.

## 2017-10-24 DIAGNOSIS — F329 Major depressive disorder, single episode, unspecified: Secondary | ICD-10-CM | POA: Diagnosis not present

## 2017-10-24 DIAGNOSIS — N182 Chronic kidney disease, stage 2 (mild): Secondary | ICD-10-CM | POA: Diagnosis not present

## 2017-10-24 DIAGNOSIS — I10 Essential (primary) hypertension: Secondary | ICD-10-CM | POA: Diagnosis not present

## 2017-10-24 DIAGNOSIS — E119 Type 2 diabetes mellitus without complications: Secondary | ICD-10-CM | POA: Diagnosis not present

## 2017-10-24 DIAGNOSIS — Z794 Long term (current) use of insulin: Secondary | ICD-10-CM | POA: Diagnosis not present

## 2017-10-31 ENCOUNTER — Telehealth: Payer: Self-pay | Admitting: Gastroenterology

## 2017-10-31 ENCOUNTER — Other Ambulatory Visit: Payer: Self-pay

## 2017-10-31 NOTE — Telephone Encounter (Signed)
Amitriptyline was refilled 9/17 x 1 refill and sent to [facility] - called pt's wife/left message.  Wife called back -informed of the above. Stated she received a shipment but it was not included. Stated she will call [facility]; it has any problem, she will call back,.

## 2017-10-31 NOTE — Telephone Encounter (Signed)
Routed to DOD.

## 2017-10-31 NOTE — Telephone Encounter (Signed)
Received referral for pt to schedule colon but had colon in 2016 with Dr Deatra Ina please review and advised if patient is due or when is he due? dod Dr Havery Moros

## 2017-10-31 NOTE — Telephone Encounter (Signed)
He had a large polyp removed by Dr. Deatra Ina in 2016, he is due for surveillance colonoscopy at this time. He can be directly scheduled for it if you can help coordinate. Thanks

## 2017-10-31 NOTE — Telephone Encounter (Signed)
amitriptyline (ELAVIL) 50 MG tablet, refill request @  [facility] [place], [place] [phone] (Phone) [phone] (Fax)

## 2017-11-03 ENCOUNTER — Telehealth: Payer: Self-pay

## 2017-11-03 NOTE — Telephone Encounter (Signed)
Patient needs to be scheduled for a pre op clearance in the next week or 2. Thank you

## 2017-11-03 NOTE — Telephone Encounter (Signed)
Thanks Almyra Free, yes I agree with 2-3 day hold if PCP is okay with doing that. Thanks

## 2017-11-03 NOTE — Telephone Encounter (Signed)
[facility] Pre-operative Risk Assessment     Request for surgical clearance:     Endoscopy Procedure  What type of surgery is being performed?     colonoscopy  When is this surgery scheduled?     12/19/17  What type of clearance is required ?   Pharmacy  Are there any medications that need to be held prior to surgery and how long? Dipyridamole-aspirin, 2-3 days prior  Practice name and name of physician performing surgery?      [facility]  What is your office phone and fax number?      Phone- [phone]  [phone]  Anesthesia type (None, local, MAC, general) ?       MAC

## 2017-11-03 NOTE — Telephone Encounter (Signed)
Patient is on: dipyridamole-aspirin (AGGRENOX) 200-25 MG 12hr capsule, prescribed by PCP. Per our protocol patient should be off of this 2-3 days prior to colonoscopy. I need to send a message to PCP, are you okay with the 2-3 day timeline?

## 2017-11-06 ENCOUNTER — Ambulatory Visit (INDEPENDENT_AMBULATORY_CARE_PROVIDER_SITE_OTHER): Payer: PPO | Admitting: Internal Medicine

## 2017-11-06 ENCOUNTER — Other Ambulatory Visit: Payer: Self-pay

## 2017-11-06 ENCOUNTER — Encounter: Payer: Self-pay | Admitting: Internal Medicine

## 2017-11-06 VITALS — BP 118/79 | HR 62 | Temp 98.0°F | Ht 65.0 in | Wt 213.8 lb

## 2017-11-06 DIAGNOSIS — M25551 Pain in right hip: Secondary | ICD-10-CM | POA: Diagnosis not present

## 2017-11-06 DIAGNOSIS — Z79899 Other long term (current) drug therapy: Secondary | ICD-10-CM | POA: Diagnosis not present

## 2017-11-06 DIAGNOSIS — Z86010 Personal history of colonic polyps: Secondary | ICD-10-CM

## 2017-11-06 DIAGNOSIS — N189 Chronic kidney disease, unspecified: Secondary | ICD-10-CM

## 2017-11-06 DIAGNOSIS — I69319 Unspecified symptoms and signs involving cognitive functions following cerebral infarction: Secondary | ICD-10-CM

## 2017-11-06 DIAGNOSIS — M25561 Pain in right knee: Secondary | ICD-10-CM

## 2017-11-06 DIAGNOSIS — D125 Benign neoplasm of sigmoid colon: Secondary | ICD-10-CM

## 2017-11-06 MED ORDER — DICLOFENAC SODIUM 1 % TD GEL: 4.0000 g | Freq: Four times a day (QID) | TRANSDERMAL | 0 refills | Status: DC

## 2017-11-06 MED ORDER — ACETAMINOPHEN 325 MG PO TABS: 650.0000 mg | ORAL_TABLET | Freq: Three times a day (TID) | ORAL | 2 refills | Status: AC | PRN

## 2017-11-06 NOTE — Progress Notes (Signed)
   CC: leg pain  HPI:  Mr.Flemon H Crill is a 67 y.o. with PMH as below presenting today with leg pain. He is accompanied by his wife who supplies much of the history as he has some cognitive delay since three previous strokes.   Please see A&P for assessment of the patient's chronic medical conditions.   Past Medical History:  Diagnosis Date  . ANEMIA, NORMOCYTIC, CHRONIC 11/17/2007   Qualifier: Diagnosis of  By: Cruzita Lederer MD, Salena Saner    . Colon polyp 06/12/12   Colonoscopy and polypectomy by Dr. Deatra Ina revealed tubular adenoma and tubulovillous adenoma  . Dementia of frontal lobe type ([place]) 12/17/2010  . Depression   . Diabetes mellitus   . GERD (gastroesophageal reflux disease)   . HYPERLIPIDEMIA 04/24/2006   Qualifier: Diagnosis of  By: Cruzita Lederer MD, Salena Saner    . Hypertension   . RENAL INSUFFICIENCY, CHRONIC 04/25/2006   Qualifier: Diagnosis of  By: Cruzita Lederer MD, Salena Saner    . Stroke [facility])    3 strokes (2004, 2006, 2007)   Review of Systems:   Review of Systems  Musculoskeletal: Positive for joint pain. Negative for back pain and falls. Myalgias: right anterior patellar and superior shin pain; right hip pain.  Neurological: Negative for tingling, tremors, sensory change and focal weakness.   Physical Exam:  Constitution: NAD, uses cane, well-kempt Pulm: non-labored breathing MSK: full active and passive ROM left knee, normal gait, strength 5/5 b/l, sensations intact. NTTP Neuro: cooperative, flat affect, slow to respond Skin: c/d/i    Vitals:   11/06/17 1410  BP: 118/79  Pulse: 62  Temp: 98 F (36.7 C)  TempSrc: Oral  SpO2: 99%  Weight: 213 lb 12.8 oz (97 kg)  Height: 5\' 5"  (1.651 m)     Assessment & Plan:   See Encounters Tab for problem based charting.  Patient seen with Dr. Rebeca Alert

## 2017-11-06 NOTE — Telephone Encounter (Signed)
Pt is sch with [facility] 11/06/2017.

## 2017-11-06 NOTE — Patient Instructions (Addendum)
Thank you for allowing [place] to provide your care today. Today we discussed your right leg pain and upcoming colonoscopy      Today we made the following changes to your medications.    Please take Tylenol 325 mg as 1-2 tablets as needed for pain before physical therapy or before exercise. Please do not take more than every 8 hours.   Please use the Voltaren gel topically on your hip or knee after exercise as needed.   Please follow-up in two months to see your PCP.     Should you have any questions or concerns please call the internal medicine clinic at [phone].

## 2017-11-07 ENCOUNTER — Encounter: Payer: Self-pay | Admitting: Internal Medicine

## 2017-11-07 DIAGNOSIS — M25561 Pain in right knee: Secondary | ICD-10-CM | POA: Insufficient documentation

## 2017-11-07 DIAGNOSIS — I10 Essential (primary) hypertension: Secondary | ICD-10-CM | POA: Diagnosis not present

## 2017-11-07 DIAGNOSIS — Z794 Long term (current) use of insulin: Secondary | ICD-10-CM | POA: Diagnosis not present

## 2017-11-07 DIAGNOSIS — F329 Major depressive disorder, single episode, unspecified: Secondary | ICD-10-CM | POA: Diagnosis not present

## 2017-11-07 DIAGNOSIS — E119 Type 2 diabetes mellitus without complications: Secondary | ICD-10-CM | POA: Diagnosis not present

## 2017-11-07 DIAGNOSIS — N182 Chronic kidney disease, stage 2 (mild): Secondary | ICD-10-CM | POA: Diagnosis not present

## 2017-11-07 NOTE — Assessment & Plan Note (Addendum)
Repeat colonoscopy 11/19/17 after having 3 sessile polyps in cecum in 2016 on colonoscopy. GI requesting approval to hold Aggrenox 2-3 days prior to surgery. Advised that it was not necessary to hold these medications for low-risk procedure after discussion with hematology and per guidelines, especially considering his history of multiple CVAs.  .  - fax sent to GI

## 2017-11-07 NOTE — Assessment & Plan Note (Signed)
Patient states he sometimes has hip pain and is currently receiving PT for this. His right leg & knee have started to bother him during PT. It does not hurt before or afterward, during walking, with passive flexion or extension and is not TTP. The area of pain described includes his knee and anterior portion of his shin, around the patellar ligament. His wife states he rarely walks around and has been sitting all day for years, which is likely due to CVA in 2007 with deficits. It is likely his symptoms are secondary to overuse with PT after having been deconditioned for so long. He has CKD and is already on aggrenox blood thinner for previous strokes. We discussed potentially using Tylenol prior to his PT.  She is also worried because they have encouraged exercise on days he does not receive PT as well. Discussed with patient the importance of continuing to exercise on days without PT to assist in long term strengthening so he is not overworking his muscles on days with PT. Pt stated he understood and would stretch and walk more.   - Tylenol 325 mg 1-2 tablets prn before therapy or exercise q8h  - f/u in one month to see PCP

## 2017-11-10 ENCOUNTER — Telehealth: Payer: Self-pay | Admitting: Internal Medicine

## 2017-11-10 NOTE — Telephone Encounter (Signed)
Mel needs verbal order [phone]

## 2017-11-10 NOTE — Progress Notes (Signed)
Internal Medicine Clinic Attending  I saw and evaluated the patient.  I personally confirmed the key portions of the history and exam documented by Dr. Seawell and I reviewed pertinent patient test results.  The assessment, diagnosis, and plan were formulated together and I agree with the documentation in the resident's note.  Alexander Raines, M.D., Ph.D.  

## 2017-11-10 NOTE — Telephone Encounter (Signed)
Received patient clearance for BT, see letter.

## 2017-11-10 NOTE — Telephone Encounter (Signed)
Returned call to [facility], PT with [facility] , VO given to continue [facility] PT for once a week X 2 weeks to work on ROM to R Hip R/T high fall risk and difficulty climbing stairs.  Will route to pcp for agreement/denial.  Laurence Compton, RN

## 2017-11-11 NOTE — Telephone Encounter (Signed)
I agree. Thank you.

## 2017-11-19 DIAGNOSIS — E119 Type 2 diabetes mellitus without complications: Secondary | ICD-10-CM | POA: Diagnosis not present

## 2017-11-19 DIAGNOSIS — N182 Chronic kidney disease, stage 2 (mild): Secondary | ICD-10-CM | POA: Diagnosis not present

## 2017-11-19 DIAGNOSIS — F329 Major depressive disorder, single episode, unspecified: Secondary | ICD-10-CM | POA: Diagnosis not present

## 2017-11-19 DIAGNOSIS — I10 Essential (primary) hypertension: Secondary | ICD-10-CM | POA: Diagnosis not present

## 2017-11-19 DIAGNOSIS — Z794 Long term (current) use of insulin: Secondary | ICD-10-CM | POA: Diagnosis not present

## 2017-11-24 ENCOUNTER — Ambulatory Visit (AMBULATORY_SURGERY_CENTER): Payer: Self-pay

## 2017-11-24 VITALS — Ht 65.0 in | Wt 232.2 lb

## 2017-11-24 DIAGNOSIS — Z86010 Personal history of colonic polyps: Secondary | ICD-10-CM

## 2017-11-24 MED ORDER — NA SULFATE-K SULFATE-MG SULF 17.5-3.13-1.6 GM/177ML PO SOLN: 1.0000 | Freq: Once | ORAL | 0 refills | Status: AC

## 2017-11-24 NOTE — Progress Notes (Signed)
Per pt's wife, no allergies to soy or egg products. He does not take any weight loss meds or is not using  O2 at home.  Pt refused emmi video.  Patient's wife, Earlie Server was with the pt during the PV to help with medical questions and prep instructions. Pt could answer a few questions , but was slow to respond.  The wife will be with the pt at his colonoscopy appointment to assist with any questions.  The pt will stay on his Aggrenox prior to the colonoscopy per Dr Armstead Peaks.

## 2017-12-05 ENCOUNTER — Encounter: Payer: Self-pay | Admitting: Gastroenterology

## 2017-12-19 ENCOUNTER — Encounter: Payer: Self-pay | Admitting: Gastroenterology

## 2017-12-19 ENCOUNTER — Ambulatory Visit (AMBULATORY_SURGERY_CENTER): Payer: PPO | Admitting: Gastroenterology

## 2017-12-19 VITALS — BP 119/62 | HR 84 | Temp 98.9°F | Resp 26 | Ht 65.0 in | Wt 232.0 lb

## 2017-12-19 DIAGNOSIS — E119 Type 2 diabetes mellitus without complications: Secondary | ICD-10-CM | POA: Diagnosis not present

## 2017-12-19 DIAGNOSIS — Z8673 Personal history of transient ischemic attack (TIA), and cerebral infarction without residual deficits: Secondary | ICD-10-CM | POA: Diagnosis not present

## 2017-12-19 DIAGNOSIS — Z86010 Personal history of colonic polyps: Secondary | ICD-10-CM | POA: Diagnosis not present

## 2017-12-19 DIAGNOSIS — K219 Gastro-esophageal reflux disease without esophagitis: Secondary | ICD-10-CM | POA: Diagnosis not present

## 2017-12-19 DIAGNOSIS — Z538 Procedure and treatment not carried out for other reasons: Secondary | ICD-10-CM

## 2017-12-19 DIAGNOSIS — I10 Essential (primary) hypertension: Secondary | ICD-10-CM | POA: Diagnosis not present

## 2017-12-19 DIAGNOSIS — F0390 Unspecified dementia without behavioral disturbance: Secondary | ICD-10-CM | POA: Diagnosis not present

## 2017-12-19 MED ORDER — SODIUM CHLORIDE 0.9 % IV SOLN: 500.0000 mL | INTRAVENOUS | Status: DC

## 2017-12-19 NOTE — Op Note (Signed)
[facility] Patient Name: [name] [name] Procedure Date: 12/19/2017 10:32 AM MRN: [id number] Endoscopist: Remo Lipps P. Havery Moros , MD Age: 67 Referring MD:  Date of Birth: 1950-05-19 Gender: Male Account #: [id number] Procedure:                Colonoscopy Indications:              Surveillance: Personal history of adenomatous                            polyps on last colonoscopy 3 years ago Medicines:                Monitored Anesthesia Care Procedure:                Pre-Anesthesia Assessment:                           - Prior to the procedure, a History and Physical                            was performed, and patient medications and                            allergies were reviewed. The patient's tolerance of                            previous anesthesia was also reviewed. The risks                            and benefits of the procedure and the sedation                            options and risks were discussed with the patient.                            All questions were answered, and informed consent                            was obtained. Prior Anticoagulants: The patient has                            taken antiplatelet medication (Aggrenox). ASA Grade                            Assessment: III - A patient with severe systemic                            disease. After reviewing the risks and benefits,                            the patient was deemed in satisfactory condition to                            undergo the procedure.  After obtaining informed consent, the colonoscope                            was passed under direct vision. Throughout the                            procedure, the patient's blood pressure, pulse, and                            oxygen saturations were monitored continuously. The                            Colonoscope was introduced through the anus and                            advanced to the the cecum,  identified by                            appendiceal orifice and ileocecal valve. The                            colonoscopy was technically difficult and complex                            due to inadequate bowel prep. The patient tolerated                            the procedure well. The quality of the bowel                            preparation was inadequate. The ileocecal valve,                            appendiceal orifice, and rectum were photographed. Scope In: 10:34:05 AM Scope Out: 10:46:33 AM Scope Withdrawal Time: 0 hours 5 minutes 46 seconds  Total Procedure Duration: 0 hours 12 minutes 28 seconds  Findings:                 The perianal and digital rectal examinations were                            normal.                           A large amount of semi-liquid stool was found in                            the entire colon, making visualization difficult.                            Lavage of the area was performed using copious                            amounts of sterile water, resulting in incomplete  clearance with fair visualization at best. Splenic                            flexure and some portions of the left colon not                            seen well at all, the colonoscope continued to clog                            with lavage attempts. Of what was visualized, no                            large polyps or mass lesions. The cecum was cleared                            and normal.                           Internal hemorrhoids were found during retroflexion. Complications:            No immediate complications. Estimated blood loss:                            None. Estimated Blood Loss:     Estimated blood loss: none. Impression:               - Preparation of the colon was inadequate.                           - Stool in the entire examined colon.                           - Internal hemorrhoids.                           - No  large polyps or mass lesions, however exam was                            inadequate for screening purposes. Recommendation:           - Patient has a contact number available for                            emergencies. The signs and symptoms of potential                            delayed complications were discussed with the                            patient. Return to normal activities tomorrow.                            Written discharge instructions were provided to the  patient.                           - Resume previous diet.                           - Continue present medications.                           - Repeat colonoscopy because the bowel preparation                            was suboptimal. Recommend double prep,                            consideration for repeat exam within 6 months or so. Remo Lipps P. Armbruster, MD 12/19/2017 10:51:20 AM This report has been signed electronically.

## 2017-12-19 NOTE — Patient Instructions (Signed)

## 2017-12-19 NOTE — Progress Notes (Signed)
Report to PACU, RN, vss, BBS= Clear.  

## 2017-12-22 ENCOUNTER — Telehealth: Payer: Self-pay

## 2017-12-22 NOTE — Telephone Encounter (Signed)
  Follow up Call-  Call back number 12/19/2017  Post procedure Call Back phone  # [phone]  Permission to leave phone message Yes  Some recent data might be hidden     Patient questions:  Do you have a fever, pain , or abdominal swelling? No. Pain Score  0 *  Have you tolerated food without any problems? Yes.    Have you been able to return to your normal activities? Yes.    Do you have any questions about your discharge instructions: Diet   No. Medications  No. Follow up visit  No.  Do you have questions or concerns about your Care? No.  Actions: * If pain score is 4 or above: No action needed, pain <4.

## 2017-12-24 ENCOUNTER — Other Ambulatory Visit: Payer: PPO | Admitting: Orthotics

## 2017-12-24 DIAGNOSIS — M2011 Hallux valgus (acquired), right foot: Secondary | ICD-10-CM | POA: Diagnosis not present

## 2017-12-24 DIAGNOSIS — M2012 Hallux valgus (acquired), left foot: Secondary | ICD-10-CM | POA: Diagnosis not present

## 2017-12-24 DIAGNOSIS — E1140 Type 2 diabetes mellitus with diabetic neuropathy, unspecified: Secondary | ICD-10-CM | POA: Diagnosis not present

## 2018-01-05 ENCOUNTER — Ambulatory Visit: Payer: PPO | Admitting: Podiatry

## 2018-01-20 ENCOUNTER — Ambulatory Visit (INDEPENDENT_AMBULATORY_CARE_PROVIDER_SITE_OTHER): Payer: PPO | Admitting: Internal Medicine

## 2018-01-20 ENCOUNTER — Other Ambulatory Visit: Payer: Self-pay

## 2018-01-20 ENCOUNTER — Encounter: Payer: Self-pay | Admitting: Internal Medicine

## 2018-01-20 VITALS — BP 103/61 | HR 99 | Temp 97.6°F | Ht 65.0 in | Wt 232.1 lb

## 2018-01-20 DIAGNOSIS — Z7984 Long term (current) use of oral hypoglycemic drugs: Secondary | ICD-10-CM | POA: Diagnosis not present

## 2018-01-20 DIAGNOSIS — I69398 Other sequelae of cerebral infarction: Secondary | ICD-10-CM | POA: Diagnosis not present

## 2018-01-20 DIAGNOSIS — I6930 Unspecified sequelae of cerebral infarction: Secondary | ICD-10-CM

## 2018-01-20 DIAGNOSIS — I129 Hypertensive chronic kidney disease with stage 1 through stage 4 chronic kidney disease, or unspecified chronic kidney disease: Secondary | ICD-10-CM

## 2018-01-20 DIAGNOSIS — D126 Benign neoplasm of colon, unspecified: Secondary | ICD-10-CM

## 2018-01-20 DIAGNOSIS — Z0000 Encounter for general adult medical examination without abnormal findings: Secondary | ICD-10-CM

## 2018-01-20 DIAGNOSIS — M25561 Pain in right knee: Secondary | ICD-10-CM

## 2018-01-20 DIAGNOSIS — E1122 Type 2 diabetes mellitus with diabetic chronic kidney disease: Secondary | ICD-10-CM | POA: Diagnosis not present

## 2018-01-20 DIAGNOSIS — G8929 Other chronic pain: Secondary | ICD-10-CM | POA: Diagnosis not present

## 2018-01-20 DIAGNOSIS — Z23 Encounter for immunization: Secondary | ICD-10-CM

## 2018-01-20 DIAGNOSIS — M549 Dorsalgia, unspecified: Secondary | ICD-10-CM

## 2018-01-20 DIAGNOSIS — M545 Low back pain: Secondary | ICD-10-CM

## 2018-01-20 DIAGNOSIS — N182 Chronic kidney disease, stage 2 (mild): Secondary | ICD-10-CM | POA: Diagnosis not present

## 2018-01-20 DIAGNOSIS — E1169 Type 2 diabetes mellitus with other specified complication: Secondary | ICD-10-CM | POA: Diagnosis not present

## 2018-01-20 DIAGNOSIS — I10 Essential (primary) hypertension: Secondary | ICD-10-CM

## 2018-01-20 DIAGNOSIS — Z79899 Other long term (current) drug therapy: Secondary | ICD-10-CM | POA: Diagnosis not present

## 2018-01-20 DIAGNOSIS — D125 Benign neoplasm of sigmoid colon: Secondary | ICD-10-CM

## 2018-01-20 LAB — POCT GLYCOSYLATED HEMOGLOBIN (HGB A1C): HEMOGLOBIN A1C: 6.3 % — AB (ref 4.0–5.6)

## 2018-01-20 LAB — GLUCOSE, CAPILLARY: Glucose-Capillary: 103 mg/dL — ABNORMAL HIGH (ref 70–99)

## 2018-01-20 NOTE — Assessment & Plan Note (Signed)
-  Patient had a recent colonoscopy but unfortunately was not adequately prepped. -Patient had a lot of liquid stool in the colon which obscured the colonoscopy. -He will need repeat colonoscopy in 6 months with double prep per GI -No further work-up at this time

## 2018-01-20 NOTE — Progress Notes (Signed)
   Subjective:    Patient ID: [name] [name], male    DOB: [birth year], 68 y.o.   MRN: [id number]  HPI I have seen and examined this patient.  Patient is here for routine follow-up of his hypertension and diabetes.  Per patient and his wife he has not been doing the exercises that he was supposed to do with PT.  He still has difficulty ambulating and needs assistance with daily activities.  Patient also complains of some mild lower back pain especially when standing. He states he is compliant with all his medications and denies any other complaints at this time.   Review of Systems  Constitutional: Negative.   HENT: Negative.   Respiratory: Negative.   Cardiovascular: Negative.   Gastrointestinal: Negative.   Musculoskeletal: Positive for arthralgias and back pain.  Neurological: Negative.   Psychiatric/Behavioral: Negative.        Objective:   Physical Exam Constitutional:      Appearance: Normal appearance.  HENT:     Head: Normocephalic and atraumatic.     Mouth/Throat:     Pharynx: Oropharynx is clear. No oropharyngeal exudate.  Eyes:     Pupils: Pupils are equal, round, and reactive to light.  Neck:     Musculoskeletal: Neck supple.  Cardiovascular:     Rate and Rhythm: Normal rate and regular rhythm.     Heart sounds: Normal heart sounds.  Pulmonary:     Effort: Pulmonary effort is normal.     Breath sounds: Normal breath sounds. No wheezing or rales.  Abdominal:     General: Bowel sounds are normal. There is no distension.     Palpations: Abdomen is soft.     Tenderness: There is no abdominal tenderness.  Musculoskeletal:        General: No swelling.     Right lower leg: No edema.     Left lower leg: No edema.  Lymphadenopathy:     Cervical: No cervical adenopathy.  Neurological:     Mental Status: He is alert and oriented to person, place, and time.  Psychiatric:        Mood and Affect: Mood normal.        Behavior: Behavior normal.             Assessment & Plan:  Please see problem based charting for assessment and plan:

## 2018-01-20 NOTE — Assessment & Plan Note (Signed)
-  This problem is chronic and stable -Patient continues to have difficulty ambulating on his own except for very short distances -He did work with PT in the recent past but did not follow-up with the exercises -He also complains of some mild back pain on standing -I explained to the patient importance of working with PT and doing exercises daily and believe the stretching may also help his back pain -We will obtain another home health PT referral -Continue with Aggrenox -No further work-up at this time

## 2018-01-20 NOTE — Assessment & Plan Note (Signed)
Lab Results  Component Value Date   HGBA1C 6.3 (A) 01/20/2018   HGBA1C 6.6 (A) 09/30/2017   HGBA1C 6.5 02/25/2017     Assessment: Diabetes control:  Well-controlled Progress toward A1C goal:   At goal Comments: Patient is compliant with metformin 500 mg twice a day  Plan: Medications:  continue current medications Home glucose monitoring: Frequency:   Timing:   Instruction/counseling given: reminded to get eye exam Educational resources provided:   Self management tools provided:   Other plans: We will check BMP on follow-up visit

## 2018-01-20 NOTE — Patient Instructions (Addendum)
-  It was a pleasure seeing you today -We will refer you for physical therapy again -We will give you a pneumonia shot today -Your blood pressure and diabetes are well controlled.  Keep up the great work! -Please call me if you have any questions or if you need any refills

## 2018-01-20 NOTE — Assessment & Plan Note (Signed)
-  This problem is chronic and stable -Patient denies any urinary complaints -We will check a BMP and vitamin D level on his follow-up visit -No further work-up at this time

## 2018-01-20 NOTE — Assessment & Plan Note (Signed)
-  PCV13 given today

## 2018-01-20 NOTE — Assessment & Plan Note (Signed)
BP Readings from Last 3 Encounters:  01/20/18 103/61  12/19/17 119/62  11/06/17 118/79    Lab Results  Component Value Date   NA 146 (H) 09/30/2017   K 4.1 09/30/2017   CREATININE 1.59 (H) 09/30/2017    Assessment: Blood pressure control:  Well-controlled Progress toward BP goal:   At goal Comments: Patient is compliant with losartan 100 mg, HCTZ 12.5 mg and amlodipine 10 mg daily  Plan: Medications:  continue current medications Educational resources provided:   Self management tools provided:   Other plans: We will check BMP on follow-up visit

## 2018-01-20 NOTE — Assessment & Plan Note (Addendum)
-  Patient complains of some mild lower back pain over the last week especially on standing -No fevers chills, no bowel or bladder incontinence, no focal weakness, no loss of sensation -Per wife patient remains sedentary all day and I suspect that the back pain may be secondary to a musculoskeletal issue from his sedentary lifestyle -Home health PT referral sent -Patient will also need assistance with ambulation. Will order rolling walker with seat -I suspect that his back pain will improve with stretching exercises

## 2018-01-20 NOTE — Assessment & Plan Note (Signed)
-  This problem is chronic and stable -Patient states that his knee pain is much better -He has not had to use the diclofenac gel but uses Tylenol as needed -No further work-up at this time -We will follow-up at his next visit

## 2018-01-21 ENCOUNTER — Telehealth: Payer: Self-pay | Admitting: *Deleted

## 2018-01-21 NOTE — Telephone Encounter (Signed)
Patient worked with Alvis Lemmings as of 11/10/2017. Spoke with Adela Lank, LPN with [facility] regarding order for [facility] PT. He will contact patient today and call office with [place] date. Hubbard Hartshorn, RN, BSN

## 2018-01-23 ENCOUNTER — Encounter: Payer: Self-pay | Admitting: *Deleted

## 2018-01-23 ENCOUNTER — Telehealth: Payer: Self-pay | Admitting: Internal Medicine

## 2018-01-23 NOTE — Telephone Encounter (Signed)
That is fine.   Thank you

## 2018-01-23 NOTE — Telephone Encounter (Signed)
[name] [name] from [facility]; pt said reschedule Monday for PT

## 2018-01-23 NOTE — Telephone Encounter (Signed)
[name] [name] stated they will not be available today nor tomorrow so will start on Monday.

## 2018-01-26 DIAGNOSIS — E1122 Type 2 diabetes mellitus with diabetic chronic kidney disease: Secondary | ICD-10-CM | POA: Diagnosis not present

## 2018-01-26 DIAGNOSIS — F0280 Dementia in other diseases classified elsewhere without behavioral disturbance: Secondary | ICD-10-CM | POA: Diagnosis not present

## 2018-01-26 DIAGNOSIS — G3109 Other frontotemporal dementia: Secondary | ICD-10-CM | POA: Diagnosis not present

## 2018-01-26 DIAGNOSIS — I129 Hypertensive chronic kidney disease with stage 1 through stage 4 chronic kidney disease, or unspecified chronic kidney disease: Secondary | ICD-10-CM | POA: Diagnosis not present

## 2018-01-26 DIAGNOSIS — I69318 Other symptoms and signs involving cognitive functions following cerebral infarction: Secondary | ICD-10-CM | POA: Diagnosis not present

## 2018-01-27 ENCOUNTER — Other Ambulatory Visit: Payer: Self-pay | Admitting: *Deleted

## 2018-01-27 DIAGNOSIS — E1169 Type 2 diabetes mellitus with other specified complication: Secondary | ICD-10-CM

## 2018-01-27 DIAGNOSIS — I6930 Unspecified sequelae of cerebral infarction: Secondary | ICD-10-CM

## 2018-01-27 MED ORDER — METFORMIN HCL 500 MG PO TABS: ORAL_TABLET | ORAL | 3 refills | Status: DC

## 2018-01-27 MED ORDER — ASPIRIN-DIPYRIDAMOLE ER 25-200 MG PO CP12: 1.0000 | ORAL_CAPSULE | Freq: Two times a day (BID) | ORAL | 1 refills | Status: DC

## 2018-01-27 NOTE — Telephone Encounter (Signed)
Metformin "No Print" on 09/30/17.

## 2018-01-27 NOTE — Telephone Encounter (Signed)
Following message received from [place] at [facility]:  Made numerous attempts to schedule start of care with patient however he and his wife kept putting [place] off. Was finally successful yesterday in starting patient (01/26/2018) L. Silvano Rusk, RN, BSN

## 2018-01-28 ENCOUNTER — Telehealth: Payer: Self-pay

## 2018-01-28 DIAGNOSIS — M549 Dorsalgia, unspecified: Principal | ICD-10-CM

## 2018-01-28 DIAGNOSIS — G8929 Other chronic pain: Secondary | ICD-10-CM

## 2018-01-28 NOTE — Telephone Encounter (Signed)
VO for PT: Noncompliant Very dependent on spouse 2x week for 3 weeks 1x week for 3 weeks CSW for home assist, home is very cluttered and disorganized, wife is very discouraged with pt Also DME rollator walker so pt can amb better outside, need script- give to lauren for [facility] Do you agree?

## 2018-01-28 NOTE — Telephone Encounter (Signed)
I agree

## 2018-01-28 NOTE — Telephone Encounter (Signed)
[name] [name] with [facility] requesting VO for PT. Please call back.

## 2018-01-29 NOTE — Addendum Note (Signed)
Addended by: Aldine Contes on: 01/29/2018 11:16 AM   Modules accepted: Orders

## 2018-01-29 NOTE — Telephone Encounter (Signed)
Placed an order

## 2018-01-29 NOTE — Telephone Encounter (Signed)
[facility] does deliver to [facility]. Order for [place] along with demographic sheet, insurance card and F2F notes from 01/20/2018 OV faxed to [phone]. Hubbard Hartshorn, RN, BSN

## 2018-01-29 NOTE — Telephone Encounter (Signed)
Left message for Ria Bush of [facility] to see if they deliver to patient's address in [facility]. Awaiting reply. Hubbard Hartshorn, RN, BSN

## 2018-01-30 NOTE — Telephone Encounter (Signed)
Received call from Ria Bush of [facility]. Patient's insurance is requesting prior Auth for rolator. They have submitted the PA and are awaiting response. They left message on patient's VM with update. Hubbard Hartshorn, RN, BSN

## 2018-01-30 NOTE — Telephone Encounter (Signed)
Thank you :)

## 2018-01-30 NOTE — Telephone Encounter (Signed)
Anything for me to do?

## 2018-02-04 ENCOUNTER — Telehealth: Payer: Self-pay | Admitting: *Deleted

## 2018-02-04 ENCOUNTER — Encounter: Payer: Self-pay | Admitting: Podiatry

## 2018-02-04 ENCOUNTER — Ambulatory Visit: Payer: PPO | Admitting: Podiatry

## 2018-02-04 DIAGNOSIS — M79676 Pain in unspecified toe(s): Secondary | ICD-10-CM | POA: Diagnosis not present

## 2018-02-04 DIAGNOSIS — M2012 Hallux valgus (acquired), left foot: Secondary | ICD-10-CM

## 2018-02-04 DIAGNOSIS — B351 Tinea unguium: Secondary | ICD-10-CM | POA: Diagnosis not present

## 2018-02-04 DIAGNOSIS — E1140 Type 2 diabetes mellitus with diabetic neuropathy, unspecified: Secondary | ICD-10-CM

## 2018-02-04 DIAGNOSIS — M2011 Hallux valgus (acquired), right foot: Secondary | ICD-10-CM

## 2018-02-04 NOTE — Progress Notes (Signed)
Patient ID: [name] [name], male   DOB: [birth year], 68 y.o.   MRN: [id number] Complaint:  Visit Type: Patient returns to my office for continued preventative foot care services. Complaint: Patient states" my nails have grown long and thick and become painful to walk and wear shoes" Patient has been diagnosed with DM with neuropathy.Marland Kitchen He presents for preventative foot care services. No changes to ROS  Podiatric Exam: Vascular: dorsalis pedis and posterior tibial pulses are palpable bilateral. Capillary return is immediate. Temperature gradient is WNL. Skin turgor WNL  Sensorium: Diminished  Semmes Weinstein monofilament test. Normal tactile sensation bilaterally. Nail Exam: Pt has thick disfigured discolored nails with subungual debris noted bilateral entire nail hallux through fifth toenails Ulcer Exam: There is no evidence of ulcer or pre-ulcerative changes or infection. Orthopedic Exam: Muscle tone and strength are WNL. No limitations in general ROM. No crepitus or effusions noted. Foot type and digits show no abnormalities. HAV  B/L. Skin: No Porokeratosis. No infection or ulcers  Diagnosis:  Onychomycosis  B/L., Pain in right toe, pain in left toes  Treatment & Plan Procedures and Treatment: Consent by patient was obtained for treatment procedures. The patient understood the discussion of treatment and procedures well. All questions were answered thoroughly reviewed. Debridement of mycotic and hypertrophic toenails, 1 through 5 bilateral and clearing of subungual debris. No ulceration, no infection noted. . Return Visit-Office Procedure: Patient instructed to return to the office for a follow up visit 3 months for continued evaluation and treatment.   Gardiner Barefoot DPM

## 2018-02-04 NOTE — Telephone Encounter (Signed)
[facility] PT calls for ICD codes for billing r/t CVA, gave both

## 2018-02-14 DIAGNOSIS — I129 Hypertensive chronic kidney disease with stage 1 through stage 4 chronic kidney disease, or unspecified chronic kidney disease: Secondary | ICD-10-CM | POA: Diagnosis not present

## 2018-02-14 DIAGNOSIS — G3109 Other frontotemporal dementia: Secondary | ICD-10-CM | POA: Diagnosis not present

## 2018-02-14 DIAGNOSIS — F0280 Dementia in other diseases classified elsewhere without behavioral disturbance: Secondary | ICD-10-CM | POA: Diagnosis not present

## 2018-02-14 DIAGNOSIS — I69318 Other symptoms and signs involving cognitive functions following cerebral infarction: Secondary | ICD-10-CM | POA: Diagnosis not present

## 2018-02-14 DIAGNOSIS — E1122 Type 2 diabetes mellitus with diabetic chronic kidney disease: Secondary | ICD-10-CM | POA: Diagnosis not present

## 2018-02-16 DIAGNOSIS — E119 Type 2 diabetes mellitus without complications: Secondary | ICD-10-CM | POA: Diagnosis not present

## 2018-02-16 DIAGNOSIS — H538 Other visual disturbances: Secondary | ICD-10-CM | POA: Diagnosis not present

## 2018-02-16 DIAGNOSIS — H2511 Age-related nuclear cataract, right eye: Secondary | ICD-10-CM | POA: Diagnosis not present

## 2018-02-16 LAB — HM DIABETES EYE EXAM

## 2018-02-18 NOTE — Telephone Encounter (Addendum)
[name] [name], PT with [facility] called for update on request for rolator. Call placed to [facility] at [facility]. Message left on self-identified VM requesting return call with update. Hubbard Hartshorn, RN, BSN

## 2018-02-18 NOTE — Telephone Encounter (Signed)
Let me know if I need to put a new order in. Thanks!

## 2018-02-18 NOTE — Telephone Encounter (Addendum)
Barnetta Chapel returned call. States patient was contacted on 02/11/2018 and notified that PA was approved but his insurance would only cover 80%; he would be responsible for other 20% ($91.46). Patient was not willing to pay even though he was offered a payment plan. He asked about rolling walker which would be $20. He was instructed to contact his PCP for new order to be placed which he has not done. Curry notified. He will discuss with patient these options and call us back. Hubbard Hartshorn, RN, BSN

## 2018-02-18 NOTE — Telephone Encounter (Addendum)
Barnetta Chapel called back to say they have left 3 prior VMs requesting return call and another one today without response. She will continue to contact patient so he can obtain a walker. Hubbard Hartshorn, RN, BSN

## 2018-02-20 ENCOUNTER — Telehealth: Payer: Self-pay

## 2018-02-20 NOTE — Telephone Encounter (Signed)
Thank you :)

## 2018-02-20 NOTE — Telephone Encounter (Signed)
CSW dish ph visit done today, csw states she does not think they were making very much progress in pt's situation. PT is disch also today. CSW states wife is stating today that placement would be best for pt but is not sure that wife is serious. She has notified workers that will pursue with wife placement in facility if wife is serious about it. This is basically an [place].

## 2018-02-20 NOTE — Telephone Encounter (Signed)
Pt's wife requesting to speak with a nurse about a walker. Please call back.

## 2018-03-30 ENCOUNTER — Other Ambulatory Visit: Payer: Self-pay | Admitting: Internal Medicine

## 2018-03-30 DIAGNOSIS — I6930 Unspecified sequelae of cerebral infarction: Secondary | ICD-10-CM

## 2018-03-30 DIAGNOSIS — E1169 Type 2 diabetes mellitus with other specified complication: Secondary | ICD-10-CM

## 2018-03-31 NOTE — Telephone Encounter (Signed)
Pt has refills at local pharmacy, but wife wishes to have refills sent to [employer] [facility]).Regenia Skeeter, Shatonya Passon Cassady3/17/202010:14 AM

## 2018-05-29 ENCOUNTER — Other Ambulatory Visit: Payer: Self-pay | Admitting: Internal Medicine

## 2018-05-29 DIAGNOSIS — F329 Major depressive disorder, single episode, unspecified: Secondary | ICD-10-CM

## 2018-05-29 DIAGNOSIS — F32A Depression, unspecified: Secondary | ICD-10-CM

## 2018-05-29 NOTE — Telephone Encounter (Signed)
Pt requesting a nurse to call [phone]

## 2018-05-29 NOTE — Telephone Encounter (Signed)
Returned call to patient. No answer. Left message on VM requesting return call. L. Cortland Crehan, RN, BSN     

## 2018-05-29 NOTE — Telephone Encounter (Signed)
Wife returned call requesting refill on amitriptyline. States patient is completely out. Would like one month refill at [facility] [place] and 90 day supply with refills sent to [facility] mail order.

## 2018-06-01 MED ORDER — AMITRIPTYLINE HCL 50 MG PO TABS: 50.0000 mg | ORAL_TABLET | Freq: Every day | ORAL | 1 refills | Status: DC

## 2018-07-02 ENCOUNTER — Encounter: Payer: Self-pay | Admitting: Gastroenterology

## 2018-07-21 ENCOUNTER — Ambulatory Visit (INDEPENDENT_AMBULATORY_CARE_PROVIDER_SITE_OTHER): Payer: PPO | Admitting: Podiatry

## 2018-07-21 ENCOUNTER — Other Ambulatory Visit: Payer: Self-pay

## 2018-07-21 ENCOUNTER — Encounter: Payer: Self-pay | Admitting: Podiatry

## 2018-07-21 ENCOUNTER — Encounter

## 2018-07-21 DIAGNOSIS — M79675 Pain in left toe(s): Secondary | ICD-10-CM

## 2018-07-21 DIAGNOSIS — E1169 Type 2 diabetes mellitus with other specified complication: Secondary | ICD-10-CM | POA: Diagnosis not present

## 2018-07-21 DIAGNOSIS — B351 Tinea unguium: Secondary | ICD-10-CM

## 2018-07-21 DIAGNOSIS — M79674 Pain in right toe(s): Secondary | ICD-10-CM

## 2018-07-21 NOTE — Progress Notes (Signed)
Patient ID: DAAIEL STARLIN, male   DOB: 1950-07-16, 68 y.o.   MRN: [id number] Complaint:  Visit Type: Patient returns to my office for continued preventative foot care services. Complaint: Patient states" my nails have grown long and thick and become painful to walk and wear shoes" Patient has been diagnosed with DM with neuropathy.Marland Kitchen He presents for preventative foot care services. No changes to ROS  Podiatric Exam: Vascular: dorsalis pedis and posterior tibial pulses are palpable bilateral. Capillary return is immediate. Temperature gradient is WNL. Skin turgor WNL  Sensorium: Diminished  Semmes Weinstein monofilament test. Normal tactile sensation bilaterally. Nail Exam: Pt has thick disfigured discolored nails with subungual debris noted bilateral entire nail hallux through fifth toenails Ulcer Exam: There is no evidence of ulcer or pre-ulcerative changes or infection. Orthopedic Exam: Muscle tone and strength are WNL. No limitations in general ROM. No crepitus or effusions noted. Foot type and digits show no abnormalities. HAV  B/L. Skin: No Porokeratosis. No infection or ulcers  Diagnosis:  Onychomycosis  B/L., Pain in right toe, pain in left toes  Treatment & Plan Procedures and Treatment: Consent by patient was obtained for treatment procedures. The patient understood the discussion of treatment and procedures well. All questions were answered thoroughly reviewed. Debridement of mycotic and hypertrophic toenails, 1 through 5 bilateral and clearing of subungual debris. No ulceration, no infection noted. . Return Visit-Office Procedure: Patient instructed to return to the office for a follow up visit 3 months for continued evaluation and treatment.   Gardiner Barefoot DPM

## 2018-08-12 ENCOUNTER — Other Ambulatory Visit: Payer: Self-pay | Admitting: Internal Medicine

## 2018-08-12 DIAGNOSIS — E1169 Type 2 diabetes mellitus with other specified complication: Secondary | ICD-10-CM

## 2018-08-12 DIAGNOSIS — E785 Hyperlipidemia, unspecified: Secondary | ICD-10-CM

## 2018-08-12 DIAGNOSIS — F329 Major depressive disorder, single episode, unspecified: Secondary | ICD-10-CM

## 2018-08-12 DIAGNOSIS — I10 Essential (primary) hypertension: Secondary | ICD-10-CM

## 2018-08-12 DIAGNOSIS — K219 Gastro-esophageal reflux disease without esophagitis: Secondary | ICD-10-CM

## 2018-08-12 DIAGNOSIS — F32A Depression, unspecified: Secondary | ICD-10-CM

## 2018-08-13 MED ORDER — HYDROCHLOROTHIAZIDE 12.5 MG PO CAPS: 12.5000 mg | ORAL_CAPSULE | Freq: Every day | ORAL | 1 refills | Status: DC

## 2018-10-27 ENCOUNTER — Encounter: Payer: Self-pay | Admitting: Podiatry

## 2018-10-27 ENCOUNTER — Ambulatory Visit (INDEPENDENT_AMBULATORY_CARE_PROVIDER_SITE_OTHER): Payer: PPO | Admitting: Podiatry

## 2018-10-27 ENCOUNTER — Other Ambulatory Visit: Payer: Self-pay

## 2018-10-27 DIAGNOSIS — E1169 Type 2 diabetes mellitus with other specified complication: Secondary | ICD-10-CM | POA: Diagnosis not present

## 2018-10-27 DIAGNOSIS — M79674 Pain in right toe(s): Secondary | ICD-10-CM

## 2018-10-27 DIAGNOSIS — M79675 Pain in left toe(s): Secondary | ICD-10-CM | POA: Diagnosis not present

## 2018-10-27 DIAGNOSIS — B351 Tinea unguium: Secondary | ICD-10-CM

## 2018-10-27 NOTE — Progress Notes (Signed)
Patient ID: [name] [name], male   DOB: 02/03/1950, 68 y.o.   MRN: [id number] Complaint:  Visit Type: Patient returns to my office for continued preventative foot care services. Complaint: Patient states" my nails have grown long and thick and become painful to walk and wear shoes" Patient has been diagnosed with DM with neuropathy.. He presents for preventative foot care services. No changes to ROS  Podiatric Exam: Vascular: dorsalis pedis and posterior tibial pulses are palpable bilateral. Capillary return is immediate. Temperature gradient is WNL. Skin turgor WNL  Sensorium: Diminished  Semmes Weinstein monofilament test. Normal tactile sensation bilaterally. Nail Exam: Pt has thick disfigured discolored nails with subungual debris noted bilateral entire nail hallux through fifth toenails Ulcer Exam: There is no evidence of ulcer or pre-ulcerative changes or infection. Orthopedic Exam: Muscle tone and strength are WNL. No limitations in general ROM. No crepitus or effusions noted. Foot type and digits show no abnormalities. HAV  B/L. Skin: No Porokeratosis. No infection or ulcers  Diagnosis:  Onychomycosis  B/L., Pain in right toe, pain in left toes  Treatment & Plan Procedures and Treatment: Consent by patient was obtained for treatment procedures. The patient understood the discussion of treatment and procedures well. All questions were answered thoroughly reviewed. Debridement of mycotic and hypertrophic toenails, 1 through 5 bilateral and clearing of subungual debris. No ulceration, no infection noted. . Return Visit-Office Procedure: Patient instructed to return to the office for a follow up visit 3 months for continued evaluation and treatment.   Mardella Nuckles DPM 

## 2018-11-02 ENCOUNTER — Other Ambulatory Visit: Payer: Self-pay | Admitting: Internal Medicine

## 2018-11-02 DIAGNOSIS — F32A Depression, unspecified: Secondary | ICD-10-CM

## 2018-11-02 DIAGNOSIS — F329 Major depressive disorder, single episode, unspecified: Secondary | ICD-10-CM

## 2018-11-02 DIAGNOSIS — I6930 Unspecified sequelae of cerebral infarction: Secondary | ICD-10-CM

## 2019-01-01 ENCOUNTER — Other Ambulatory Visit: Payer: Self-pay | Admitting: *Deleted

## 2019-01-01 DIAGNOSIS — I6930 Unspecified sequelae of cerebral infarction: Secondary | ICD-10-CM

## 2019-01-01 DIAGNOSIS — F329 Major depressive disorder, single episode, unspecified: Secondary | ICD-10-CM

## 2019-01-01 DIAGNOSIS — F32A Depression, unspecified: Secondary | ICD-10-CM

## 2019-01-01 MED ORDER — ASPIRIN-DIPYRIDAMOLE ER 25-200 MG PO CP12: 1.0000 | ORAL_CAPSULE | Freq: Two times a day (BID) | ORAL | 1 refills | Status: DC

## 2019-01-01 MED ORDER — AMITRIPTYLINE HCL 50 MG PO TABS: 50.0000 mg | ORAL_TABLET | Freq: Every day | ORAL | 1 refills | Status: DC

## 2019-01-26 ENCOUNTER — Ambulatory Visit: Payer: PPO | Admitting: Podiatry

## 2019-02-02 ENCOUNTER — Ambulatory Visit: Payer: PPO | Admitting: Podiatry

## 2019-02-09 ENCOUNTER — Encounter: Payer: Self-pay | Admitting: Internal Medicine

## 2019-02-09 ENCOUNTER — Ambulatory Visit (INDEPENDENT_AMBULATORY_CARE_PROVIDER_SITE_OTHER): Payer: PPO | Admitting: Internal Medicine

## 2019-02-09 VITALS — BP 130/97 | HR 96 | Temp 98.2°F | Wt 255.1 lb

## 2019-02-09 DIAGNOSIS — M549 Dorsalgia, unspecified: Secondary | ICD-10-CM

## 2019-02-09 DIAGNOSIS — E559 Vitamin D deficiency, unspecified: Secondary | ICD-10-CM | POA: Diagnosis not present

## 2019-02-09 DIAGNOSIS — D125 Benign neoplasm of sigmoid colon: Secondary | ICD-10-CM

## 2019-02-09 DIAGNOSIS — K219 Gastro-esophageal reflux disease without esophagitis: Secondary | ICD-10-CM

## 2019-02-09 DIAGNOSIS — E785 Hyperlipidemia, unspecified: Secondary | ICD-10-CM | POA: Diagnosis not present

## 2019-02-09 DIAGNOSIS — I10 Essential (primary) hypertension: Secondary | ICD-10-CM

## 2019-02-09 DIAGNOSIS — G8929 Other chronic pain: Secondary | ICD-10-CM

## 2019-02-09 DIAGNOSIS — Z79899 Other long term (current) drug therapy: Secondary | ICD-10-CM

## 2019-02-09 DIAGNOSIS — E1122 Type 2 diabetes mellitus with diabetic chronic kidney disease: Secondary | ICD-10-CM | POA: Diagnosis not present

## 2019-02-09 DIAGNOSIS — Z23 Encounter for immunization: Secondary | ICD-10-CM

## 2019-02-09 DIAGNOSIS — F339 Major depressive disorder, recurrent, unspecified: Secondary | ICD-10-CM | POA: Diagnosis not present

## 2019-02-09 DIAGNOSIS — E1169 Type 2 diabetes mellitus with other specified complication: Secondary | ICD-10-CM | POA: Diagnosis not present

## 2019-02-09 DIAGNOSIS — D649 Anemia, unspecified: Secondary | ICD-10-CM

## 2019-02-09 DIAGNOSIS — I6930 Unspecified sequelae of cerebral infarction: Secondary | ICD-10-CM

## 2019-02-09 DIAGNOSIS — Z0000 Encounter for general adult medical examination without abnormal findings: Secondary | ICD-10-CM

## 2019-02-09 DIAGNOSIS — I129 Hypertensive chronic kidney disease with stage 1 through stage 4 chronic kidney disease, or unspecified chronic kidney disease: Secondary | ICD-10-CM

## 2019-02-09 DIAGNOSIS — D126 Benign neoplasm of colon, unspecified: Secondary | ICD-10-CM

## 2019-02-09 DIAGNOSIS — M5489 Other dorsalgia: Secondary | ICD-10-CM

## 2019-02-09 DIAGNOSIS — F32A Depression, unspecified: Secondary | ICD-10-CM

## 2019-02-09 DIAGNOSIS — N182 Chronic kidney disease, stage 2 (mild): Secondary | ICD-10-CM | POA: Diagnosis not present

## 2019-02-09 DIAGNOSIS — F329 Major depressive disorder, single episode, unspecified: Secondary | ICD-10-CM

## 2019-02-09 DIAGNOSIS — Z7984 Long term (current) use of oral hypoglycemic drugs: Secondary | ICD-10-CM

## 2019-02-09 LAB — GLUCOSE, CAPILLARY: Glucose-Capillary: 157 mg/dL — ABNORMAL HIGH (ref 70–99)

## 2019-02-09 LAB — POCT GLYCOSYLATED HEMOGLOBIN (HGB A1C): Hemoglobin A1C: 6.9 % — AB (ref 4.0–5.6)

## 2019-02-09 MED ORDER — AMITRIPTYLINE HCL 50 MG PO TABS: 50.0000 mg | ORAL_TABLET | Freq: Every day | ORAL | 1 refills | Status: AC

## 2019-02-09 MED ORDER — RISPERIDONE 0.5 MG PO TABS: 0.5000 mg | ORAL_TABLET | Freq: Two times a day (BID) | ORAL | 1 refills | Status: AC

## 2019-02-09 MED ORDER — ASPIRIN-DIPYRIDAMOLE ER 25-200 MG PO CP12: 1.0000 | ORAL_CAPSULE | Freq: Two times a day (BID) | ORAL | 1 refills | Status: AC

## 2019-02-09 MED ORDER — OMEPRAZOLE 20 MG PO CPDR: DELAYED_RELEASE_CAPSULE | ORAL | 1 refills | Status: AC

## 2019-02-09 MED ORDER — HYDROCHLOROTHIAZIDE 12.5 MG PO CAPS: 12.5000 mg | ORAL_CAPSULE | Freq: Every day | ORAL | 1 refills | Status: AC

## 2019-02-09 MED ORDER — METFORMIN HCL 500 MG PO TABS: 500.0000 mg | ORAL_TABLET | Freq: Two times a day (BID) | ORAL | 1 refills | Status: DC

## 2019-02-09 MED ORDER — ATORVASTATIN CALCIUM 40 MG PO TABS: 40.0000 mg | ORAL_TABLET | Freq: Every day | ORAL | 1 refills | Status: AC

## 2019-02-09 MED ORDER — LOSARTAN POTASSIUM 100 MG PO TABS: 100.0000 mg | ORAL_TABLET | Freq: Every day | ORAL | 1 refills | Status: AC

## 2019-02-09 MED ORDER — AMLODIPINE BESYLATE 10 MG PO TABS: 10.0000 mg | ORAL_TABLET | Freq: Every day | ORAL | 1 refills | Status: AC

## 2019-02-09 NOTE — Progress Notes (Signed)
   Subjective:    Patient ID: [name] [name], male    DOB: May 25, 1950, 70 y.o.   MRN: [id number]  HPI  I have seen and examined this patient.  Patient is here for routine follow-up of his hypertension and diabetes.  Patient states that he feels well currently and denies any new complaints.  He states that his back pain has improved.  He states that he is compliant with his medications.   Review of Systems  Constitutional: Negative.   HENT: Negative.   Respiratory: Negative.   Cardiovascular: Negative.   Gastrointestinal: Negative.   Musculoskeletal: Negative.   Neurological: Negative.   Psychiatric/Behavioral: Negative.        Objective:   Physical Exam Constitutional:      Appearance: Normal appearance.  HENT:     Head: Normocephalic and atraumatic.  Cardiovascular:     Rate and Rhythm: Normal rate and regular rhythm.     Heart sounds: Normal heart sounds.  Pulmonary:     Effort: Pulmonary effort is normal.     Breath sounds: Normal breath sounds. No wheezing or rales.  Abdominal:     General: Bowel sounds are normal. There is no distension.     Palpations: Abdomen is soft.     Tenderness: There is no abdominal tenderness.  Musculoskeletal:        General: No swelling or tenderness.     Cervical back: Neck supple.  Lymphadenopathy:     Cervical: No cervical adenopathy.  Neurological:     General: No focal deficit present.     Mental Status: He is alert and oriented to person, place, and time.  Psychiatric:        Mood and Affect: Mood normal.        Behavior: Behavior normal.           Assessment & Plan:  Please see problem based charting for assessment and plan:

## 2019-02-09 NOTE — Assessment & Plan Note (Signed)
-  Patient to get a flu shot as well as a pneumonia 23 valent vaccine today -Patient is due for follow-up for his eye exam for his diabetes.  He was encouraged to make an appointment for this today -Patient is due for repeat colonoscopy and is encouraged to make an appointment for this as well -Patient and his wife were given information about the Covid vaccine and the importance of following up for this.  Risks and benefits of the vaccine were discussed with them and overall safety profile was discussed with him as well.

## 2019-02-09 NOTE — Assessment & Plan Note (Signed)
-  Patient had colonoscopy in December 2019 but was not adequately prepped -He has been unable to follow-up given the Covid pandemic -I advised him and his wife to call the gastroenterologist for repeat colonoscopy -No further work-up at this time

## 2019-02-09 NOTE — Assessment & Plan Note (Addendum)
-  This problem is chronic and stable -Patient baseline creatinine is approximately 1.4-1.7 -We will repeat BMP today -Patient denies any urinary complaints at this time -No further work-up for now  Addendum: -Patient's BMP results were noted.  Patient's creatinine is 1.73 which is approximately patient's baseline. -Results were discussed with patient's wife via phone. -We will follow-up repeat BMP at next visit.  No further work-up at this time.  Wife expresses understanding and is in agreement with plan.

## 2019-02-09 NOTE — Assessment & Plan Note (Addendum)
-  This problem is chronic and stable -Patient is compliant with Lipitor 40 mg daily -We will check a repeat lipid panel today -No further work-up at this time  Addendum: -Patient noted to have an elevated LDL of 117 as well as elevated triglycerides of 210 with an HDL of 42. -Patient is supposed to be on Lipitor 40 mg daily.  We will continue at this dose for now.  I am uncertain if he had been taking this daily at home.  If his repeat lipid panel shows persistently elevated LDLs I would consider increasing his Lipitor to 80 mg especially given his history of CVA. -Results discussed with patient's wife via phone

## 2019-02-09 NOTE — Patient Instructions (Addendum)
-  It was a pleasure seeing you today. -We will check some blood work on you today -We will give you a flu shot as well as pneumonia shot -Please follow-up for your eye exam -Please follow-up with the gastroenterologist for repeat colonoscopy -Please call me if you need any refills on medications or if you have any questions -Happy new year!

## 2019-02-09 NOTE — Assessment & Plan Note (Signed)
-  Patient back pain has improved with physical therapy -No further work-up at this time

## 2019-02-09 NOTE — Assessment & Plan Note (Signed)
-  This problem is chronic and stable -Per patient's wife, patient works with physical therapy but after the therapist leaves he does not do much on his own -I encouraged the patient to continue with his therapy is well as trying to ambulate on his own and doing things for himself as I believe this will help him do better overall.  Patient expressed understanding and is agreement with plan -Continue with Aggrenox and Lipitor for now -No further work-up at this time

## 2019-02-09 NOTE — Assessment & Plan Note (Addendum)
-  This problem is chronic and stable -Patient's last A1c was 6.3 -Patient is only on Metformin 500 mg twice daily -We will check repeat A1c and CBG today -Patient advised to follow-up with ophthalmology for repeat eye exam -No further work-up at this time  Addendum: -Patient A1c was 6.9 which is mildly worse than his previous A1c but is still at goal.  Will continue Metformin 500 mg twice daily.  No further work-up at this time.

## 2019-02-09 NOTE — Addendum Note (Signed)
Addended by: Marcelino Duster on: 02/09/2019 11:03 AM   Modules accepted: Orders

## 2019-02-09 NOTE — Assessment & Plan Note (Addendum)
-  This problem is chronic and stable -I suspect his anemia is likely secondary to CKD -We will follow-up repeat CBC today -No further work-up at this time  Addendum: -Patient repeat CBC showed a hemoglobin of 12.2 up from 11.2 on previous check -No further work-up at this time.  We will continue to monitor.  Results discussed with patient and wife via phone.  She expresses understanding and is in agreement with plan.

## 2019-02-09 NOTE — Assessment & Plan Note (Signed)
BP Readings from Last 3 Encounters:  02/09/19 (!) 130/97  01/20/18 103/61  12/19/17 119/62    Lab Results  Component Value Date   NA 146 (H) 09/30/2017   K 4.1 09/30/2017   CREATININE 1.59 (H) 09/30/2017    Assessment: Blood pressure control:  Well-controlled Progress toward BP goal:   At goal Comments: Patient is compliant with losartan 100 mg, HCTZ 12.5 mg, amlodipine 10 mg  Plan: Medications:  continue current medications Educational resources provided:   Self management tools provided:   Other plans: We will check BMP today

## 2019-02-09 NOTE — Assessment & Plan Note (Signed)
-  This problem is chronic and stable -We will continue with risperidone and Elavil for now -Patient PHQ-9 score is 14 today.  If this remains elevated would consider referral to [facility] -No further work-up at this time

## 2019-02-09 NOTE — Assessment & Plan Note (Signed)
-  This problem is chronic and stable -Patient symptoms are well controlled with Prilosec -We will refill this medication for him today -No further work-up at this time

## 2019-02-10 DIAGNOSIS — E559 Vitamin D deficiency, unspecified: Secondary | ICD-10-CM | POA: Insufficient documentation

## 2019-02-10 LAB — LIPID PANEL
Chol/HDL Ratio: 4.7 ratio (ref 0.0–5.0)
Cholesterol, Total: 196 mg/dL (ref 100–199)
HDL: 42 mg/dL (ref >39)
LDL Chol Calc (NIH): 117 mg/dL — ABNORMAL HIGH (ref 0–99)
Triglycerides: 210 mg/dL — ABNORMAL HIGH (ref 0–149)
VLDL Cholesterol Cal: 37 mg/dL (ref 5–40)

## 2019-02-10 LAB — BMP8+ANION GAP
Anion Gap: 15 mmol/L (ref 10.0–18.0)
BUN/Creatinine Ratio: 10 (ref 10–24)
BUN: 17 mg/dL (ref 8–27)
CO2: 25 mmol/L (ref 20–29)
Calcium: 9 mg/dL (ref 8.6–10.2)
Chloride: 104 mmol/L (ref 96–106)
Creatinine, Ser: 1.73 mg/dL — ABNORMAL HIGH (ref 0.76–1.27)
GFR calc Af Amer: 46 mL/min/{1.73_m2} — ABNORMAL LOW (ref >59)
GFR calc non Af Amer: 40 mL/min/{1.73_m2} — ABNORMAL LOW (ref >59)
Glucose: 169 mg/dL — ABNORMAL HIGH (ref 65–99)
Potassium: 4 mmol/L (ref 3.5–5.2)
Sodium: 144 mmol/L (ref 134–144)

## 2019-02-10 LAB — CBC
Hematocrit: 39.4 % (ref 37.5–51.0)
Hemoglobin: 12.2 g/dL — ABNORMAL LOW (ref 13.0–17.7)
MCH: 26.2 pg — ABNORMAL LOW (ref 26.6–33.0)
MCHC: 31 g/dL — ABNORMAL LOW (ref 31.5–35.7)
MCV: 85 fL (ref 79–97)
Platelets: 257 10*3/uL (ref 150–450)
RBC: 4.65 x10E6/uL (ref 4.14–5.80)
RDW: 17.6 % — ABNORMAL HIGH (ref 11.6–15.4)
WBC: 4.8 10*3/uL (ref 3.4–10.8)

## 2019-02-10 LAB — VITAMIN D 25 HYDROXY (VIT D DEFICIENCY, FRACTURES): Vit D, 25-Hydroxy: 6 ng/mL — ABNORMAL LOW (ref 30.0–100.0)

## 2019-02-10 MED ORDER — VITAMIN D 50 MCG (2000 UT) PO TABS: 2000.0000 [IU] | ORAL_TABLET | Freq: Every day | ORAL | 1 refills | Status: DC

## 2019-02-10 NOTE — Addendum Note (Signed)
Addended by: Aldine Contes on: 02/10/2019 09:43 AM   Modules accepted: Orders

## 2019-02-10 NOTE — Assessment & Plan Note (Signed)
-  Patient noted to have very low vitamin D level of 6 on blood work earlier today -I have prescribed vitamin D supplementation for the patient.  I discussed the results with his wife who will pick up the medication for him. -We will repeat his vitamin D level at follow-up visit

## 2019-02-13 ENCOUNTER — Other Ambulatory Visit: Payer: Self-pay

## 2019-02-13 ENCOUNTER — Emergency Department (HOSPITAL_COMMUNITY)
Admission: EM | Admit: 2019-02-13 | Discharge: 2019-02-13 | Disposition: A | Discharge status: Home / Self Care | Payer: PPO | Attending: Emergency Medicine | Admitting: Emergency Medicine

## 2019-02-13 ENCOUNTER — Emergency Department (HOSPITAL_COMMUNITY): Payer: PPO

## 2019-02-13 ENCOUNTER — Encounter (HOSPITAL_COMMUNITY): Payer: Self-pay | Admitting: Emergency Medicine

## 2019-02-13 DIAGNOSIS — Z87891 Personal history of nicotine dependence: Secondary | ICD-10-CM | POA: Diagnosis not present

## 2019-02-13 DIAGNOSIS — E1122 Type 2 diabetes mellitus with diabetic chronic kidney disease: Secondary | ICD-10-CM | POA: Insufficient documentation

## 2019-02-13 DIAGNOSIS — M1711 Unilateral primary osteoarthritis, right knee: Secondary | ICD-10-CM | POA: Diagnosis not present

## 2019-02-13 DIAGNOSIS — Z7984 Long term (current) use of oral hypoglycemic drugs: Secondary | ICD-10-CM | POA: Insufficient documentation

## 2019-02-13 DIAGNOSIS — M25461 Effusion, right knee: Secondary | ICD-10-CM | POA: Diagnosis not present

## 2019-02-13 DIAGNOSIS — I129 Hypertensive chronic kidney disease with stage 1 through stage 4 chronic kidney disease, or unspecified chronic kidney disease: Secondary | ICD-10-CM | POA: Insufficient documentation

## 2019-02-13 DIAGNOSIS — M25561 Pain in right knee: Secondary | ICD-10-CM | POA: Diagnosis not present

## 2019-02-13 DIAGNOSIS — M1990 Unspecified osteoarthritis, unspecified site: Secondary | ICD-10-CM

## 2019-02-13 DIAGNOSIS — N182 Chronic kidney disease, stage 2 (mild): Secondary | ICD-10-CM | POA: Insufficient documentation

## 2019-02-13 DIAGNOSIS — Z79899 Other long term (current) drug therapy: Secondary | ICD-10-CM | POA: Insufficient documentation

## 2019-02-13 MED ORDER — HYDROCODONE-ACETAMINOPHEN 5-325 MG PO TABS
1.0000 | ORAL_TABLET | Freq: Once | ORAL | Status: AC
  Administered 2019-02-13: 1 via ORAL
  Filled 2019-02-13: qty 1

## 2019-02-13 NOTE — ED Provider Notes (Signed)
[facility] DEPT Provider Note   CSN: [id number] Arrival date & time: 02/13/19  1551     History Chief Complaint  Patient presents with  . Knee Pain    [name] [name] is a 69 y.o. male.  Past medical history of anemia, depression, diabetes who presents for evaluation of right knee pain that began yesterday.  He states he does not recall any trauma, injury.  Triage note had mentioned that he hit it while falling into bed but patient states this is not true.  He states that he started noticing the pain last night and states it is progressively gotten worse.  He reports difficulty ambulating on it secondary to pain.  He feels like it is slightly swollen but has not had any overlying warmth, erythema.  Denies any fevers, numbness/weakness.  Denies any hip pain.  The history is provided by the patient.       Past Medical History:  Diagnosis Date  . ANEMIA, NORMOCYTIC, CHRONIC 11/17/2007   Qualifier: Diagnosis of  By: Cruzita Lederer MD, Salena Saner    . Colon polyp 06/12/12   Colonoscopy and polypectomy by Dr. Deatra Ina revealed tubular adenoma and tubulovillous adenoma  . Dementia of frontal lobe type ([place]) 12/17/2010  . Depression   . Diabetes mellitus   . GERD (gastroesophageal reflux disease)   . HYPERLIPIDEMIA 04/24/2006   Qualifier: Diagnosis of  By: Cruzita Lederer MD, Salena Saner    . Hypertension   . RENAL INSUFFICIENCY, CHRONIC 04/25/2006   Qualifier: Diagnosis of  By: Cruzita Lederer MD, Salena Saner    . Right hip pain 11/18/2016  . Stroke [facility])    3 strokes (2004, 2006, 2007)per wife,he had 3 strokes in a month!    Patient Active Problem List   Diagnosis Date Noted  . Vitamin D deficiency 02/10/2019  . Pain due to onychomycosis of toenails of both feet 07/21/2018  . Acute pain of right knee 11/07/2017  . Back pain 07/20/2015  . History of CVA with residual deficit 07/19/2015  . Depression 05/23/2015  . Environmental allergies 05/19/2014  . Healthcare maintenance 11/25/2013   . GERD (gastroesophageal reflux disease) 06/11/2012  . Benign neoplasm of colon 05/11/2012  . Dementia of frontal lobe type ([place]) 12/17/2010  . ANEMIA, NORMOCYTIC, CHRONIC 11/17/2007  . CKD (chronic kidney disease) stage 2, GFR 60-89 ml/min 04/25/2006  . Type 2 diabetes mellitus with other specified complication ([place]) [id number]  . HLD (hyperlipidemia) 04/24/2006  . Essential hypertension 04/24/2006    Past Surgical History:  Procedure Laterality Date  . COLONOSCOPY    . COLONOSCOPY N/A 06/12/2012   Procedure: COLONOSCOPY;  Surgeon: Inda Castle, MD;  Location: [facility];  Service: Endoscopy;  Laterality: N/A;  . COLONOSCOPY WITH PROPOFOL N/A 07/11/2014   Procedure: COLONOSCOPY WITH PROPOFOL;  Surgeon: Inda Castle, MD;  Location: [facility];  Service: Endoscopy;  Laterality: N/A;  . HOT HEMOSTASIS N/A 06/12/2012   Procedure: HOT HEMOSTASIS (ARGON PLASMA COAGULATION/BICAP);  Surgeon: Inda Castle, MD;  Location: [facility];  Service: Endoscopy;  Laterality: N/A;  . HOT HEMOSTASIS N/A 07/11/2014   Procedure: HOT HEMOSTASIS (ARGON PLASMA COAGULATION/BICAP);  Surgeon: Inda Castle, MD;  Location: [facility];  Service: Endoscopy;  Laterality: N/A;  . POLYPECTOMY         Family History  Problem Relation Age of Onset  . Diabetes Maternal Grandmother   . Stroke Mother   . Cirrhosis Son   . Diabetes Maternal Grandfather   . Hypertension Maternal Grandfather   .  Colon cancer Neg Hx     Social History   Tobacco Use  . Smoking status: Former Smoker    Quit date: 04/29/2005    Years since quitting: 13.8  . Smokeless tobacco: Never Used  Substance Use Topics  . Alcohol use: No    Alcohol/week: 0.0 standard drinks  . Drug use: No    Home Medications Prior to Admission medications   Medication Sig Start Date End Date Taking? Authorizing Provider  acetaminophen (TYLENOL) 325 MG tablet Take 2 tablets (650 mg total) by mouth every 8 (eight) hours as needed (prior  to physical therapy or exercise). 11/06/17   Seawell, Jaimie A, DO  amitriptyline (ELAVIL) 50 MG tablet Take 1 tablet (50 mg total) by mouth at bedtime. 02/09/19   Aldine Contes, MD  amLODipine (NORVASC) 10 MG tablet Take 1 tablet (10 mg total) by mouth daily. 02/09/19   Aldine Contes, MD  atorvastatin (LIPITOR) 40 MG tablet Take 1 tablet (40 mg total) by mouth daily. 02/09/19   Aldine Contes, MD  Cholecalciferol (VITAMIN D) 50 MCG (2000 UT) tablet Take 1 tablet (2,000 Units total) by mouth daily. 02/10/19   Aldine Contes, MD  dipyridamole-aspirin (AGGRENOX) 200-25 MG 12hr capsule Take 1 capsule by mouth 2 (two) times daily. 02/09/19   Aldine Contes, MD  hydrochlorothiazide (MICROZIDE) 12.5 MG capsule Take 1 capsule (12.5 mg total) by mouth daily. 02/09/19   Aldine Contes, MD  losartan (COZAAR) 100 MG tablet Take 1 tablet (100 mg total) by mouth daily. 02/09/19   Aldine Contes, MD  metFORMIN (GLUCOPHAGE) 500 MG tablet Take 1 tablet (500 mg total) by mouth 2 (two) times daily with a meal. 02/09/19   Aldine Contes, MD  omeprazole (PRILOSEC) 20 MG capsule Take 1 capsule by mouth twice a day before meals 02/09/19   Aldine Contes, MD  risperiDONE (RISPERDAL) 0.5 MG tablet Take 1 tablet (0.5 mg total) by mouth 2 (two) times daily. 02/09/19   Aldine Contes, MD    Allergies    Divalproex sodium  Review of Systems   Review of Systems  Constitutional: Negative for fever.  Gastrointestinal: Negative for nausea.  Musculoskeletal:       Knee pain  Skin: Negative for color change.  Neurological: Negative for weakness, numbness and headaches.  All other systems reviewed and are negative.   Physical Exam Updated Vital Signs BP (!) 154/93 (BP Location: Left Arm)   Pulse 97   Temp 98 F (36.7 C) (Oral)   Resp 20   SpO2 96%   Physical Exam Vitals and nursing note reviewed.  Constitutional:      Appearance: He is well-developed.  HENT:     Head: Normocephalic and  atraumatic.  Eyes:     General: No scleral icterus.       Right eye: No discharge.        Left eye: No discharge.     Conjunctiva/sclera: Conjunctivae normal.  Cardiovascular:     Pulses:          Radial pulses are 2+ on the right side and 2+ on the left side.       Dorsalis pedis pulses are 2+ on the right side and 2+ on the left side.  Pulmonary:     Effort: Pulmonary effort is normal.  Musculoskeletal:     Comments: Tenderness palpation in the anterior aspect of right knee with some mild overlying soft tissue swelling.  No overlying warmth, erythema.  No deformity or crepitus noted.  Limited  flexion/tension secondary to pain.  Negative varus, valgus stress.  Negative posterior drawer test.  No tenderness palpation in his right hip, right tib-fib, right ankle.  Skin:    General: Skin is warm and dry.  Neurological:     Mental Status: He is alert.  Psychiatric:        Speech: Speech normal.        Behavior: Behavior normal.     ED Results / Procedures / Treatments   Labs (all labs ordered are listed, but only abnormal results are displayed) Labs Reviewed - No data to display  EKG None  Radiology CT KNEE RIGHT WO CONTRAST  Result Date: 02/13/2019 CLINICAL DATA:  Right knee pain after fall 1 day ago. Abnormal x-ray EXAM: CT OF THE RIGHT KNEE WITHOUT CONTRAST TECHNIQUE: Multidetector CT imaging of the right knee was performed according to the standard protocol. Multiplanar CT image reconstructions were also generated. COMPARISON:  X-ray 02/13/2019 FINDINGS: Bones/Joint/Cartilage No acute fracture. No malalignment. There is degenerative spurring of the tibial spines. Mild tricompartmental osteoarthritis most pronounced within the patellofemoral compartment where there are subchondral cystic changes at the superior aspect of the lateral patellar facet and lateral trochlea. Incidental note of a well-circumscribed 1.5 cm ovoid lucent lesion within the anteromedial cortex of the proximal  tibial metaphysis with thin sclerotic margins and internal fat density compatible with a benign fibro-osseous lesion. No suspicious osseous lesions. No periostitis. There is a small to moderate knee joint effusion without hematocrit level or fat-fluid level. Ligaments Anterior and posterior cruciate ligaments are grossly intact although suboptimally assessed by CT. No obvious collateral ligament abnormality. Muscles and Tendons Normal muscle bulk. Tendinous structures appear grossly intact. Soft tissues Mild prepatellar subcutaneous edema. Scattered vascular calcification. IMPRESSION: 1. No acute osseous abnormality of the right knee. 2. Mild tricompartmental osteoarthritis most pronounced within the patellofemoral compartment. 3. Small to moderate knee joint effusion, nonspecific. 4. Degenerative spurring of the tibial spines. Electronically Signed   By: Davina Poke D.O.   On: 02/13/2019 17:21   DG Knee Complete 4 Views Right  Result Date: 02/13/2019 CLINICAL DATA:  Pain EXAM: RIGHT KNEE - COMPLETE 4+ VIEW COMPARISON:  None. FINDINGS: There is a moderate-sized suprapatellar joint effusion. There are mild tricompartmental degenerative changes. There is a lucency coursing through the base of the lateral intracondylar eminence. The osseous mineralization is within normal limits. Vascular calcifications are noted. IMPRESSION: Moderate-sized joint effusion with findings suspicious for an acute fracture involving the lateral tibial eminence. Electronically Signed   By: Constance Holster M.D.   On: 02/13/2019 16:32    Procedures Procedures (including critical care time)  Medications Ordered in ED Medications  HYDROcodone-acetaminophen (NORCO/VICODIN) 5-325 MG per tablet 1 tablet (1 tablet Oral Given 02/13/19 1810)    ED Course  I have reviewed the triage vital signs and the nursing notes.  Pertinent labs & imaging results that were available during my care of the patient were reviewed by me and  considered in my medical decision making (see chart for details).    MDM Rules/Calculators/A&P                      69 year old male who presents for evaluation of right knee pain that is been ongoing since yesterday.  He does not know of any trauma, injury.  Difficulty ambulating bearing weight secondary to pain.  No overlying warmth, erythema.  On initial ED arrival, he is afebrile, nontoxic-appearing.  Vital signs are stable.  He is  neurovascularly intact.  On exam, he has tenderness palpation in anterior aspect of right knee with some mild overlying soft tissue swelling.  No deformity or crepitus noted.  Consider sprain versus fracture versus tendinitis versus musculoskeletal pain.  History/physical exam concerning for septic arthritis, ischemic limb, DVT.  Plan for x-ray.  X-ray reviewed.  There is a moderate size joint effusion with findings suspicious for acute fracture involving the lateral tibial eminence.  We will plan for CT scan for further evaluation.  CT of knee shows no acute osseous abnormalities.  There is mild tricompartmental osteoarthritis, most pronounced within the patellofemoral compartment.  Low to moderate knee effusion.  Nonspecific.   Discussed results with patient.  Patient placed in a knee sleeve.  Occurred at home supportive care measures.  We will give him outpatient orthopedic referral that he can follow-up with if his symptoms continue. Discussed patient with Dr. Tyrone Nine who is agreeable to plan. At this time, patient exhibits no emergent life-threatening condition that require further evaluation in ED or admission. Patient had ample opportunity for questions and discussion. All patient's questions were answered with full understanding. Strict return precautions discussed. Patient expresses understanding and agreement to plan.   Portions of this note were generated with [employer]. Dictation errors may occur despite best attempts at proofreading.   Final  Clinical Impression(s) / ED Diagnoses Final diagnoses:  Acute pain of right knee  Arthritis    Rx / DC Orders ED Discharge Orders    None       Volanda Napoleon, PA-C 02/13/19 [place], [place], DO 02/13/19 2311

## 2019-02-13 NOTE — ED Triage Notes (Signed)
Patient c/o right knee pain after falling into bed yesterday. States pain worsens with weight bearing and movement.

## 2019-02-13 NOTE — ED Notes (Signed)
Called pts wife who stated she would pick him up but is 45 minutes out

## 2019-02-13 NOTE — Discharge Instructions (Signed)
As we discussed, your x-rays today did not show any evidence of broken bone.  You do have arthritis.  You can take 1000 mg of Tylenol.  Do not exceed 4000 mg of Tylenol a day.  Follow the RICE (Rest, Ice, Compression, Elevation) protocol as directed.   Follow-up with orthopedics if symptoms continue.  Return emergency department for any worsening pain, redness or swelling of the knee, fevers or any other worsening concerning symptoms.

## 2019-02-19 ENCOUNTER — Telehealth: Payer: Self-pay | Admitting: Dietician

## 2019-02-19 ENCOUNTER — Encounter: Payer: Self-pay | Admitting: *Deleted

## 2019-02-19 NOTE — Telephone Encounter (Signed)
Called for most recent eye exam report. They are faxing it

## 2019-02-22 ENCOUNTER — Telehealth: Payer: Self-pay

## 2019-02-22 NOTE — Telephone Encounter (Signed)
Requesting to speak with a nurse about having leg pain. Please call pt back.

## 2019-02-22 NOTE — Telephone Encounter (Signed)
Rtc, no answer, will try again later

## 2019-02-23 ENCOUNTER — Ambulatory Visit (INDEPENDENT_AMBULATORY_CARE_PROVIDER_SITE_OTHER): Payer: PPO | Admitting: Internal Medicine

## 2019-02-23 VITALS — BP 106/58 | HR 96

## 2019-02-23 DIAGNOSIS — Z993 Dependence on wheelchair: Secondary | ICD-10-CM | POA: Diagnosis not present

## 2019-02-23 DIAGNOSIS — I6930 Unspecified sequelae of cerebral infarction: Secondary | ICD-10-CM

## 2019-02-23 DIAGNOSIS — E1169 Type 2 diabetes mellitus with other specified complication: Secondary | ICD-10-CM

## 2019-02-23 DIAGNOSIS — J90 Pleural effusion, not elsewhere classified: Secondary | ICD-10-CM

## 2019-02-23 DIAGNOSIS — M25561 Pain in right knee: Secondary | ICD-10-CM | POA: Diagnosis not present

## 2019-02-23 DIAGNOSIS — I69354 Hemiplegia and hemiparesis following cerebral infarction affecting left non-dominant side: Secondary | ICD-10-CM

## 2019-02-23 DIAGNOSIS — Z7689 Persons encountering health services in other specified circumstances: Secondary | ICD-10-CM | POA: Insufficient documentation

## 2019-02-23 LAB — GLUCOSE, CAPILLARY: Glucose-Capillary: 133 mg/dL — ABNORMAL HIGH (ref 70–99)

## 2019-02-23 NOTE — Progress Notes (Signed)
Internal Medicine Clinic Attending  I saw and evaluated the patient.  I personally confirmed the key portions of the history and exam documented by Dr. Eileen Stanford and I reviewed pertinent patient test results.  The assessment, diagnosis, and plan were formulated together and I agree with the documentation in the resident's note.   I was present for the procedure. Small amount of clear synovial fluid was aspirated from the knee. Suspect traumatic effusion from his fall. This was not sent for analysis. Subsequent steroid inject tolerated well without complication.

## 2019-02-23 NOTE — Progress Notes (Addendum)
   CC: Mobility assessment, right knee pain, recent fall  HPI:  Mr.Bardia H Bensen is a 69 y.o. with medical history of prior CVA with left-sided weakness presenting after recent fall.  Please see problem based charting for further details.    Past Medical History:  Diagnosis Date  . ANEMIA, NORMOCYTIC, CHRONIC 11/17/2007   Qualifier: Diagnosis of  By: Cruzita Lederer MD, Salena Saner    . Colon polyp 06/12/12   Colonoscopy and polypectomy by Dr. Deatra Ina revealed tubular adenoma and tubulovillous adenoma  . Dementia of frontal lobe type ([place]) 12/17/2010  . Depression   . Diabetes mellitus   . GERD (gastroesophageal reflux disease)   . HYPERLIPIDEMIA 04/24/2006   Qualifier: Diagnosis of  By: Cruzita Lederer MD, Salena Saner    . Hypertension   . RENAL INSUFFICIENCY, CHRONIC 04/25/2006   Qualifier: Diagnosis of  By: Cruzita Lederer MD, Salena Saner    . Right hip pain 11/18/2016  . Stroke [facility])    3 strokes (2004, 2006, 2007)per wife,he had 3 strokes in a month!   Review of Systems:  As per HPI  Physical Exam:   Vitals:   02/23/19 0945  BP: (!) 106/58  Pulse: 96   Physical Exam  Constitutional: He is oriented to person, place, and time. He appears distressed (Due to right knee pain).  Eyes: Conjunctivae are normal.  Cardiovascular: Normal rate, regular rhythm and normal heart sounds.  Pulmonary/Chest: Effort normal and breath sounds normal. He has no wheezes.  Musculoskeletal:        General: Tenderness (To movement at the right knee) and edema (Mild swelling of the right knee) present.     Right knee: Swelling and effusion present. Decreased range of motion.  Neurological: He is alert and oriented to person, place, and time. He has normal sensation. He displays weakness (LUE (4/5), LLE (3/5), RUE (5/5). RLE (Limited due to pain)). He displays no tremor and normal speech. No sensory deficit. Cranial nerve deficit: CN II-XII intact except as documented above.    Assessment & Plan:   See Encounters Tab for problem  based charting.  Patient discussed with Dr. Philipp Ovens

## 2019-02-23 NOTE — Patient Instructions (Signed)
[name] [name],   Thanks for seeing [place] today. We took a little bit of fluid off and also injected your right knee. Hope this feels better.   I am placing a referral to physical therapy and wheelchair assessment.   Take Care!

## 2019-02-23 NOTE — Assessment & Plan Note (Addendum)
Right knee pain: [name] [name] states that he was in his usual state of health until a week ago when he experienced a ground-level fall while at the barbershop.  He had two episodes of ground-level falls.  He was assisted into his car by the barbers and subsequently presented to [place] with his wife.  At [facility], CT of the right knee as well as plain films of the right knee did not show any acute fractures but it did reveal small to moderate size pleural effusion.  Today, he endorses right knee pain and rates the pain at about 8 out of 10.    Since his stroke, he has had left-sided weakness and difficulty with ambulation despite prior physical therapy assessment.  He has been relying on his wife heavily recently.   On physical exams, range of motion was limited from the right lower extremity secondary to pain.  I was able to palpate a moderate size effusion/swelling on the lateral aspect of the right knee.  Arthrocentesis/Knee injection: After consent was obtained, using sterile technique the patient was prepped. The joint was entered and 5 ml's of clear synovial fluid colored fluid was withdrawn.  kenolog 40 mg and 1 ml plain Lidocaine was then injected and the needle withdrawn.  The procedure was well tolerated.  The patient is asked to continue to rest the joint for a few more days before resuming regular activities.  It may be more painful for the first 1-2 days.  Watch for fever, or increased swelling or persistent pain in the joint. Call or return to clinic prn if such symptoms occur or there is failure to improve as anticipated.  Plan: -Tylenol as needed for pain -Return precautions -Mobility assessment performed today

## 2019-02-23 NOTE — Assessment & Plan Note (Addendum)
Mobility assessment: Patient is here for mobility and power wheelchair assessment. Patient has prior CVA with left sided weakness. His weakness severely limits his ability to participate in mobility related activities of daily living. Patient requires assistance for dressing, toileting, bathing, grooming & hygiene. He is completely dependent on wife for his IADLS and meal prepping. A powered wheelchair would provider him with more independence with dressing, toileting, bathing and hygiene by allowing her more freedom to move around the house on her own.  He currently has a cane and has had multiple episodes of ground-level fall which poses a huge risk to the patient.. With his left-sided weakness he is unable to self-propel a manual wheelchair. A scooter would not be appropriate because he does not have adequate trunk stability. Patient is physically and mentally capable of operating a power wheelchair safely in the home and is willing to do so.  I have referred him to physical therapy.

## 2019-02-24 NOTE — Addendum Note (Signed)
Addended by: Jean Rosenthal on: 02/24/2019 04:12 PM   Modules accepted: Orders

## 2019-03-12 ENCOUNTER — Encounter: Payer: Self-pay | Admitting: Rehabilitation

## 2019-03-12 ENCOUNTER — Telehealth: Payer: Self-pay | Admitting: Rehabilitation

## 2019-03-12 ENCOUNTER — Ambulatory Visit: Payer: PPO | Attending: Internal Medicine | Admitting: Rehabilitation

## 2019-03-12 ENCOUNTER — Other Ambulatory Visit: Payer: Self-pay

## 2019-03-12 DIAGNOSIS — R296 Repeated falls: Secondary | ICD-10-CM

## 2019-03-12 DIAGNOSIS — M25561 Pain in right knee: Secondary | ICD-10-CM | POA: Diagnosis not present

## 2019-03-12 DIAGNOSIS — I69354 Hemiplegia and hemiparesis following cerebral infarction affecting left non-dominant side: Secondary | ICD-10-CM

## 2019-03-12 DIAGNOSIS — R2681 Unsteadiness on feet: Secondary | ICD-10-CM | POA: Diagnosis not present

## 2019-03-12 DIAGNOSIS — G8929 Other chronic pain: Secondary | ICD-10-CM

## 2019-03-12 DIAGNOSIS — M6281 Muscle weakness (generalized): Secondary | ICD-10-CM | POA: Diagnosis not present

## 2019-03-12 NOTE — Telephone Encounter (Signed)
Dr. Philipp Ovens,    I just evaluated [name] [name] here at [facility] for PT.  He desperately needs RW for improved safety and independence. Please write order for RW in Epic work que and I can provide to him and wife at next visit.    Thanks,  Cameron Sprang, PT, MPT [facility] [address] [place], [place], [address] Phone: [phone]   Fax:  [phone] 03/12/19, 1:40 PM

## 2019-03-12 NOTE — Therapy (Signed)
[facility] [address] [place], [place], [address] Phone: [phone]   Fax:  [phone]  Physical Therapy Evaluation  Patient Details  Name: [name] [name] MRN: [id number] Date of Birth: 11-Sep-1950 Referring Provider (PT): Velna Ochs, MD   Encounter Date: 03/12/2019  PT End of Session - 03/12/19 1321    Visit Number  1    Number of Visits  17    Date for PT Re-Evaluation  05/11/19    Authorization Type  HTA (10th visit progress note?)    PT Start Time  0935    PT Stop Time  1015    PT Time Calculation (min)  40 min    Activity Tolerance  Patient tolerated treatment well    Behavior During Therapy  [facility] for tasks assessed/performed;Flat affect       Past Medical History:  Diagnosis Date  . ANEMIA, NORMOCYTIC, CHRONIC 11/17/2007   Qualifier: Diagnosis of  By: Cruzita Lederer MD, Salena Saner    . Colon polyp 06/12/12   Colonoscopy and polypectomy by Dr. Deatra Ina revealed tubular adenoma and tubulovillous adenoma  . Dementia of frontal lobe type ([place]) 12/17/2010  . Depression   . Diabetes mellitus   . GERD (gastroesophageal reflux disease)   . HYPERLIPIDEMIA 04/24/2006   Qualifier: Diagnosis of  By: Cruzita Lederer MD, Salena Saner    . Hypertension   . RENAL INSUFFICIENCY, CHRONIC 04/25/2006   Qualifier: Diagnosis of  By: Cruzita Lederer MD, Salena Saner    . Right hip pain 11/18/2016  . Stroke [facility])    3 strokes (2004, 2006, 2007)per wife,he had 3 strokes in a month!    Past Surgical History:  Procedure Laterality Date  . COLONOSCOPY    . COLONOSCOPY N/A 06/12/2012   Procedure: COLONOSCOPY;  Surgeon: Inda Castle, MD;  Location: [facility] ENDOSCOPY;  Service: Endoscopy;  Laterality: N/A;  . COLONOSCOPY WITH PROPOFOL N/A 07/11/2014   Procedure: COLONOSCOPY WITH PROPOFOL;  Surgeon: Inda Castle, MD;  Location: [facility] ENDOSCOPY;  Service: Endoscopy;  Laterality: N/A;  . HOT HEMOSTASIS N/A 06/12/2012   Procedure: HOT HEMOSTASIS (ARGON PLASMA  COAGULATION/BICAP);  Surgeon: Inda Castle, MD;  Location: [facility];  Service: Endoscopy;  Laterality: N/A;  . HOT HEMOSTASIS N/A 07/11/2014   Procedure: HOT HEMOSTASIS (ARGON PLASMA COAGULATION/BICAP);  Surgeon: Inda Castle, MD;  Location: [facility] ENDOSCOPY;  Service: Endoscopy;  Laterality: N/A;  . POLYPECTOMY      There were no vitals filed for this visit.   Subjective Assessment - 03/12/19 0946    Subjective  "I'm here because I need to work on my knees and get some exercises."  Per wife, "he needs to work on this whole situation."    Patient is accompained by:  Family member   wife, Earlie Server   Limitations  Walking;Standing;Lifting    How long can you walk comfortably?  Only in house, but wife reports he is still very unsteady    Patient Stated Goals  Per wife, "I'm having to do everything for him and I'm getting so tired of it"    Currently in Pain?  Yes    Pain Score  8     Pain Location  Knee    Pain Orientation  Right    Pain Descriptors / Indicators  Aching;Throbbing    Pain Type  Chronic pain    Pain Onset  More than a month ago    Pain Frequency  Intermittent    Aggravating Factors   being up on feet,  moving fast         [facility] PT Assessment - 03/12/19 0001      Assessment   Medical Diagnosis  Chronic CVA with L sided weakness    Referring Provider (PT)  Velna Ochs, MD    Onset Date/Surgical Date  --   2007   Prior Therapy  previous HHPT, OP PT      Precautions   Precautions  Fall      Restrictions   Weight Bearing Restrictions  No      Balance Screen   Has the patient fallen in the past 6 months  Yes    How many times?  4    Has the patient had a decrease in activity level because of a fear of falling?   Yes    Is the patient reluctant to leave their home because of a fear of falling?   Yes      [place]  Private residence    Living Arrangements  Spouse/significant other    Available Help at Discharge  Available 24  hours/day    Type of [place] to enter    Entrance Stairs-Number of Steps  2    [place]  One level    [place] - single point      Prior Function   Level of Independence  Needs assistance with ADLs;Needs assistance with homemaking;Needs assistance with gait;Needs assistance with transfers   dependent on ADLs since CVA   Vocation  On disability    Comments  Wife assists him with all ADLs despite pts physical ability to assist.       Cognition   Overall Cognitive Status  Within Functional Limits for tasks assessed   seemed functional within session     Sensation   Light Touch  Appears Intact    Hot/Cold  Appears Intact    Proprioception  Appears Intact      Coordination   Gross Motor Movements are Fluid and Coordinated  No    Fine Motor Movements are Fluid and Coordinated  No    Coordination and Movement Description  Due to strength deficits     Heel Shin Test  grossly WFL      ROM / Strength   AROM / PROM / Strength  Strength      Strength   Overall Strength  Deficits    Overall Strength Comments  L and R hip flex 4/5, L knee ext 5/5, R knee ext 3/5 (painful), L knee flex 5/5, R knee flex 3/5 (painful), B ankle DF 4/5.  Also has functional movement of LUE with quick assessment.       Transfers   Transfers  Sit to Stand;Stand to Sit    Sit to Stand  5: Supervision    Sit to Stand Details  Verbal cues for sequencing;Verbal cues for technique    Five time sit to stand comments   18.31 secs with BUE support on lap    Stand to Sit  5: Supervision    Stand to Sit Details (indicate cue type and reason)  Verbal cues for sequencing      Ambulation/Gait   Ambulation/Gait  Yes    Ambulation/Gait Assistance  4: Min guard    Ambulation Distance (Feet)  100 Feet    Assistive device  Straight cane    Gait Pattern  Step-through pattern;Decreased stride length;Decreased hip/knee flexion - right;Decreased  hip/knee flexion - left;Shuffle;Trunk flexed;Wide base of support    Ambulation Surface  Level;Indoor    Gait velocity  1.75 ft/sec with SPC and min/guard for safety       Standardized Balance Assessment   Standardized Balance Assessment  Timed Up and Go Test      Timed Up and Go Test   TUG  Normal TUG    Normal TUG (seconds)  17.56   with SPC and min/guard                Objective measurements completed on examination: See above findings.              PT Education - 03/12/19 1318    Education Details  education on evaluation results, POC, goals.  Also had discussion with Supervisor (away from patient) and will make a referral to social work to see if there are any available services such as respite care available as the wife is having a very  hard time being sole caregiver for pt.    Person(s) Educated  Patient;Spouse    Methods  Explanation;Demonstration    Comprehension  Verbalized understanding       PT Short Term Goals - 03/12/19 1330      PT SHORT TERM GOAL #1   Title  Pt/wife will be independent with initial HEP in order to indicate decreased fall risk and improved functional mobility.  (Target Date: 04/11/19)    Time  4    Period  Weeks    Status  New    Target Date  04/11/19      PT SHORT TERM GOAL #2   Title  Will assess BERG balance test and improve score by 4 points in order to indicate decreased fall risk.    Time  4    Period  Weeks    Status  New      PT SHORT TERM GOAL #3   Title  Pt will improve TUG to </=15 secs with LRAD at S level in order to indicate decreased fall risk.    Time  4    Period  Weeks    Status  New      PT SHORT TERM GOAL #4   Title  Pt will improve gait speed to >/=2.35 ft/sec w/ LRAD in order to indicate decreased fall risk.    Time  4    Period  Weeks    Status  New      PT SHORT TERM GOAL #5   Title  Pt will ambulate x 150' with RW at S level over household surfaces to indicate improved safety at home.     Time  4    Period  Weeks    Status  New      Additional Short Term Goals   Additional Short Term Goals  Yes      PT SHORT TERM GOAL #6   Title  Pt/wife will indicate that pt is completing 50% of his ADLs in order to indicate improved independence at home.    Time  4    Period  Weeks        PT Long Term Goals - 03/12/19 1333      PT LONG TERM GOAL #1   Title  Pt/wife will be independent with final HEP in order to indicate decreased fall risk and improved functional mobility.  (Target Date:05/11/19)    Time  8  Period  Weeks    Status  New    Target Date  05/11/19      PT LONG TERM GOAL #2   Title  Pt will improve BERG balance score by 8 points from baseline in order to indicate decreased fall risk.    Time  8    Period  Weeks    Status  New      PT LONG TERM GOAL #3   Title  Pt will perform TUG in </=13.5 secs in order to indicate decreased fall risk.    Time  8    Period  Weeks    Status  New      PT LONG TERM GOAL #4   Title  Pt will improve gait speed to >/=2.62 ft/sec w/ LRAD in order to indicate safe community ambulation.    Time  8    Period  Weeks    Status  New      PT LONG TERM GOAL #5   Title  Pt will ambulate x 150' at mod I level with LRAD in order to indicate safe household mobility.    Time  8    Period  Weeks    Status  New             Plan - 03/12/19 1322    Clinical Impression Statement  Pt presents with old CVA from 2007 with continued L sided weakness but actually has more R leg weakness/pain due to recent fall in barber shop. He did receive injection in knee but still reports pain.  Note history of HTN, HLD, DMII, CKD, and dementia is under diagnosis but was not in previous MD note.  Upon PT evaluation, note gait speed is 1.75 ft/sec, indicative of high fall risk, 5TSS time of 18.31 secs indicative of decreased functional strength and fall risk, and TUG time of 17.56 secs indicative of high fall risk.  Pt will benefit from skilled OP neuro  PT to address deficits, however I am concerned about his ability to progress as wife reports he "never does anything at home."    Personal Factors and Comorbidities  Comorbidity 3+;Age;Time since onset of injury/illness/exacerbation    Comorbidities  see above    Examination-Activity Limitations  Bathing;Bend;Carry;Dressing;Hygiene/Grooming;Lift;Toileting;Stand;Stairs;Reach Overhead;Locomotion Level;Transfers    Examination-Participation Restrictions  Cleaning;Laundry;Medication Management;Meal Prep;Personal Finances    Stability/Clinical Decision Making  Evolving/Moderate complexity    Clinical Decision Making  Moderate    Rehab Potential  Good    PT Frequency  2x / week    PT Duration  8 weeks    PT Treatment/Interventions  ADLs/Self Care Home Management;Electrical Stimulation;DME Instruction;Gait training;Stair training;Functional mobility training;Therapeutic activities;Therapeutic exercise;Balance training;Neuromuscular re-education;Cognitive remediation;Patient/family education;Orthotic Fit/Training;Passive range of motion;Energy conservation;Vestibular    PT Next Visit Plan  BERG, initiate HEP for BLE strengthening (gentle with R knee pain), set up an ADL situation to assess his ability to complete task-maybe laundry??    Recommended Other Services  See if social worker has contacted them    Consulted and Agree with Plan of Care  Patient;Family member/caregiver    Family Member Consulted  wife, dorothy       Patient will benefit from skilled therapeutic intervention in order to improve the following deficits and impairments:  Abnormal gait, Decreased activity tolerance, Decreased balance, Decreased cognition, Decreased coordination, Decreased endurance, Decreased knowledge of precautions, Decreased knowledge of use of DME, Decreased mobility, Decreased strength, Difficulty walking, Impaired perceived functional ability, Impaired flexibility, Postural dysfunction  Visit Diagnosis: Repeated  falls  Muscle weakness (generalized)  Hemiplegia and hemiparesis following cerebral infarction affecting left non-dominant side (HCC)  Unsteadiness on feet  Chronic pain of right knee     Problem List Patient Active Problem List   Diagnosis Date Noted  . Encounter for power mobility device assessment 02/23/2019  . Vitamin D deficiency 02/10/2019  . Pain due to onychomycosis of toenails of both feet 07/21/2018  . Acute pain of right knee 11/07/2017  . Back pain 07/20/2015  . History of CVA with residual deficit 07/19/2015  . Depression 05/23/2015  . Environmental allergies 05/19/2014  . Healthcare maintenance 11/25/2013  . GERD (gastroesophageal reflux disease) 06/11/2012  . Benign neoplasm of colon 05/11/2012  . Dementia of frontal lobe type ([place]) 12/17/2010  . ANEMIA, NORMOCYTIC, CHRONIC 11/17/2007  . CKD (chronic kidney disease) stage 2, GFR 60-89 ml/min 04/25/2006  . Type 2 diabetes mellitus with other specified complication ([place]) [id number]  . HLD (hyperlipidemia) 04/24/2006  . Essential hypertension 04/24/2006    Cameron Sprang, PT, MPT [facility] [address] [place], [place], [address] Phone: [phone]   Fax:  [phone] 03/12/19, 1:37 PM  Name: [name] [name] MRN: [id number] Date of Birth: [birth year]

## 2019-03-15 ENCOUNTER — Ambulatory Visit: Payer: PPO | Attending: Internal Medicine | Admitting: Physical Therapy

## 2019-03-15 ENCOUNTER — Telehealth: Payer: Self-pay | Admitting: Physical Therapy

## 2019-03-15 DIAGNOSIS — G8929 Other chronic pain: Secondary | ICD-10-CM | POA: Insufficient documentation

## 2019-03-15 DIAGNOSIS — R2681 Unsteadiness on feet: Secondary | ICD-10-CM | POA: Insufficient documentation

## 2019-03-15 DIAGNOSIS — R296 Repeated falls: Secondary | ICD-10-CM | POA: Insufficient documentation

## 2019-03-15 DIAGNOSIS — I69354 Hemiplegia and hemiparesis following cerebral infarction affecting left non-dominant side: Secondary | ICD-10-CM | POA: Insufficient documentation

## 2019-03-15 DIAGNOSIS — M6281 Muscle weakness (generalized): Secondary | ICD-10-CM | POA: Insufficient documentation

## 2019-03-15 DIAGNOSIS — M25561 Pain in right knee: Secondary | ICD-10-CM | POA: Insufficient documentation

## 2019-03-15 NOTE — Telephone Encounter (Signed)
Called and spoke with Mrs. Tift, as Mr. Wigen did not show for his 10:15 PT appointment today.  She states she forgot about the appointment.  Reminded her of Mr. Minnick next scheduled appointment, 03/18/2019 at 8 am.  Mady Haagensen, PT 03/15/19 3:04 PM Phone: [phone] Fax: [phone]

## 2019-03-15 NOTE — Telephone Encounter (Signed)
Is that [employer]?

## 2019-03-18 ENCOUNTER — Other Ambulatory Visit: Payer: Self-pay

## 2019-03-18 ENCOUNTER — Ambulatory Visit: Payer: PPO | Admitting: Physical Therapy

## 2019-03-18 DIAGNOSIS — M6281 Muscle weakness (generalized): Secondary | ICD-10-CM

## 2019-03-18 DIAGNOSIS — I69354 Hemiplegia and hemiparesis following cerebral infarction affecting left non-dominant side: Secondary | ICD-10-CM | POA: Diagnosis not present

## 2019-03-18 DIAGNOSIS — M25561 Pain in right knee: Secondary | ICD-10-CM | POA: Diagnosis not present

## 2019-03-18 DIAGNOSIS — G8929 Other chronic pain: Secondary | ICD-10-CM | POA: Diagnosis not present

## 2019-03-18 DIAGNOSIS — R2681 Unsteadiness on feet: Secondary | ICD-10-CM

## 2019-03-18 DIAGNOSIS — R296 Repeated falls: Secondary | ICD-10-CM | POA: Diagnosis not present

## 2019-03-18 NOTE — Telephone Encounter (Signed)
Yes ma'am a rolling walker.    Thank you so much! Cameron Sprang, PT, MPT [facility] [address] [place], [place], [address] Phone: [phone]   Fax:  [phone] 03/18/19, 2:13 PM

## 2019-03-18 NOTE — Telephone Encounter (Signed)
Thank you :)

## 2019-03-18 NOTE — Patient Instructions (Signed)
Access Code: [email or web address] URL: [email or web address] Date: 03/18/2019 Prepared by: Mady Haagensen  Exercises Seated March - 10 reps - 2 sets - 1x daily - 5x weekly Seated Knee Flexion Extension AROM - 10 reps - 2 sets - 1x daily - 5x weekly Seated Toe Raise - 10 reps - 2 sets - 1x daily - 7x weekly Seated Hamstring Stretch - 3 reps - 1 sets - 30 sec hold - 1x daily - 7x weekly Sit to Stand - 5 reps - 1-2 sets - 1x daily - 5x weekly    [facility] [place] (this would be to get in touch with Education officer, museum). They can just walk in (no appt needed) and ask for intake form to get set up with social worker. Address is:   [address]  [place], [place] [address]

## 2019-03-18 NOTE — Therapy (Signed)
[facility] [address] [place], [place], [address] Phone: [phone]   Fax:  [phone]  Physical Therapy Treatment  Patient Details  Name: [name] [name] MRN: [id number] Date of Birth: August 09, 1950 Referring Provider (PT): Velna Ochs, MD   Encounter Date: 03/18/2019  PT End of Session - 03/18/19 1623    Visit Number  2    Number of Visits  17    Date for PT Re-Evaluation  05/11/19    Authorization Type  HTA (10th visit progress note?)    PT Start Time  0813   Pt arrived late   PT Stop Time  0848    PT Time Calculation (min)  35 min    Activity Tolerance  Patient tolerated treatment well    Behavior During Therapy  [facility] for tasks assessed/performed;Flat affect       Past Medical History:  Diagnosis Date  . ANEMIA, NORMOCYTIC, CHRONIC 11/17/2007   Qualifier: Diagnosis of  By: Cruzita Lederer MD, Salena Saner    . Colon polyp 06/12/12   Colonoscopy and polypectomy by Dr. Deatra Ina revealed tubular adenoma and tubulovillous adenoma  . Dementia of frontal lobe type ([place]) 12/17/2010  . Depression   . Diabetes mellitus   . GERD (gastroesophageal reflux disease)   . HYPERLIPIDEMIA 04/24/2006   Qualifier: Diagnosis of  By: Cruzita Lederer MD, Salena Saner    . Hypertension   . RENAL INSUFFICIENCY, CHRONIC 04/25/2006   Qualifier: Diagnosis of  By: Cruzita Lederer MD, Salena Saner    . Right hip pain 11/18/2016  . Stroke [facility])    3 strokes (2004, 2006, 2007)per wife,he had 3 strokes in a month!    Past Surgical History:  Procedure Laterality Date  . COLONOSCOPY    . COLONOSCOPY N/A 06/12/2012   Procedure: COLONOSCOPY;  Surgeon: Inda Castle, MD;  Location: [facility];  Service: Endoscopy;  Laterality: N/A;  . COLONOSCOPY WITH PROPOFOL N/A 07/11/2014   Procedure: COLONOSCOPY WITH PROPOFOL;  Surgeon: Inda Castle, MD;  Location: [facility];  Service: Endoscopy;  Laterality: N/A;  . HOT HEMOSTASIS N/A 06/12/2012   Procedure: HOT HEMOSTASIS (ARGON  PLASMA COAGULATION/BICAP);  Surgeon: Inda Castle, MD;  Location: [facility];  Service: Endoscopy;  Laterality: N/A;  . HOT HEMOSTASIS N/A 07/11/2014   Procedure: HOT HEMOSTASIS (ARGON PLASMA COAGULATION/BICAP);  Surgeon: Inda Castle, MD;  Location: [facility];  Service: Endoscopy;  Laterality: N/A;  . POLYPECTOMY      There were no vitals filed for this visit.  Subjective Assessment - 03/18/19 0815    Subjective  No changes, no falls since last visit.    Patient is accompained by:  Family member   wife, Earlie Server   Limitations  Walking;Standing;Lifting    How long can you walk comfortably?  Only in house, but wife reports he is still very unsteady    Patient Stated Goals  Per wife, "I'm having to do everything for him and I'm getting so tired of it"    Currently in Pain?  No/denies    Pain Onset  More than a month ago                       [facility] Adult PT Treatment/Exercise - 03/18/19 0816      Transfers   Transfers  Sit to Stand;Stand to Sit    Sit to Stand  5: Supervision;With upper extremity assist;From bed   Hands on knees   Sit to Stand Details  Verbal cues for  sequencing;Verbal cues for technique    Stand to Sit  5: Supervision;With upper extremity assist;To bed    Stand to Sit Details (indicate cue type and reason)  Verbal cues for sequencing    Number of Reps  Other reps (comment);1 set   5 reps     Standardized Balance Assessment   Standardized Balance Assessment  Berg Balance Test      Berg Balance Test   Sit to Stand  Able to stand without using hands and stabilize independently    Standing Unsupported  Able to stand safely 2 minutes    Sitting with Back Unsupported but Feet Supported on Floor or Stool  Able to sit safely and securely 2 minutes    Stand to Sit  Sits safely with minimal use of hands    Transfers  Able to transfer safely, minor use of hands    Standing Unsupported with Eyes Closed  Able to stand 10 seconds with supervision     Standing Ubsupported with Feet Together  Able to place feet together independently and stand for 1 minute with supervision    From Standing, Reach Forward with Outstretched Arm  Can reach forward >12 cm safely (5")    From Standing Position, Pick up Object from Floor  Able to pick up shoe, needs supervision    From Standing Position, Turn to Look Behind Over each Shoulder  Looks behind one side only/other side shows less weight shift    Turn 360 Degrees  Able to turn 360 degrees safely but slowly   6.38   Standing Unsupported, Alternately Place Feet on Step/Stool  Needs assistance to keep from falling or unable to try    Standing Unsupported, One Foot in Front  Able to plae foot ahead of the other independently and hold 30 seconds    Standing on One Leg  Tries to lift leg/unable to hold 3 seconds but remains standing independently    Total Score  41      Exercises   Exercises  Knee/Hip;Ankle      Knee/Hip Exercises: Stretches   Active Hamstring Stretch  Right;Left;2 reps;30 seconds    Active Hamstring Stretch Limitations  Seated edge of mat; PT provides verbal, tactile cues for technique      Knee/Hip Exercises: Seated   Heel Slides  AROM;Strengthening;Right;Left;1 set;10 reps   Knee flexion/knee extension with heel slides   Marching  Strengthening;Right;Left;5 reps;2 sets      Ankle Exercises: Seated   Toe Raise  10 reps;3 seconds    Toe Raise Limitations  Cues for technique           Self Care:  PT Education - 03/18/19 1620    Education Details  Initiated HEP-see instructions; provided patient/wife with community resources including [employer] [facility] [employer] [place] and [employer] (printed March [employer] Newsletter)    Person(s) Educated  Patient;Spouse    Methods  Explanation;Demonstration;Verbal cues;Handout    Comprehension  Verbalized understanding;Returned demonstration;Verbal cues required;Need further instruction       PT Short Term Goals -  03/18/19 1631      PT SHORT TERM GOAL #1   Title  Pt/wife will be independent with initial HEP in order to indicate decreased fall risk and improved functional mobility.  (Target Date: 04/11/19)    Time  4    Period  Weeks    Status  New    Target Date  04/11/19      PT SHORT TERM GOAL #2  Title  Will assess BERG balance test and improve score by 4 points in order to indicate decreased fall risk.    Baseline  03/18/2019:  41/56    Time  4    Period  Weeks    Status  New      PT SHORT TERM GOAL #3   Title  Pt will improve TUG to </=15 secs with LRAD at S level in order to indicate decreased fall risk.    Time  4    Period  Weeks    Status  New      PT SHORT TERM GOAL #4   Title  Pt will improve gait speed to >/=2.35 ft/sec w/ LRAD in order to indicate decreased fall risk.    Time  4    Period  Weeks    Status  New      PT SHORT TERM GOAL #5   Title  Pt will ambulate x 150' with RW at S level over household surfaces to indicate improved safety at home.    Time  4    Period  Weeks    Status  New      PT SHORT TERM GOAL #6   Title  Pt/wife will indicate that pt is completing 50% of his ADLs in order to indicate improved independence at home.    Time  4    Period  Weeks        PT Long Term Goals - 03/12/19 1333      PT LONG TERM GOAL #1   Title  Pt/wife will be independent with final HEP in order to indicate decreased fall risk and improved functional mobility.  (Target Date:05/11/19)    Time  8    Period  Weeks    Status  New    Target Date  05/11/19      PT LONG TERM GOAL #2   Title  Pt will improve BERG balance score by 8 points from baseline in order to indicate decreased fall risk.    Time  8    Period  Weeks    Status  New      PT LONG TERM GOAL #3   Title  Pt will perform TUG in </=13.5 secs in order to indicate decreased fall risk.    Time  8    Period  Weeks    Status  New      PT LONG TERM GOAL #4   Title  Pt will improve gait speed to >/=2.62 ft/sec  w/ LRAD in order to indicate safe community ambulation.    Time  8    Period  Weeks    Status  New      PT LONG TERM GOAL #5   Title  Pt will ambulate x 150' at mod I level with LRAD in order to indicate safe household mobility.    Time  8    Period  Weeks    Status  New            Plan - 03/18/19 1627    Clinical Impression Statement  Assessed Berg Balance test today, with score of 41/56 indicating increased fall risk.  Initiated HEP today, targeting lower extremity strengthening.  Pt c/o some pain in R knee during transitional movements, but not significantly limiting exercises today.  Pt will benefit from further skilled PT to address decreased strength, balance, and overall functional mobility.    Personal Factors and Comorbidities  Comorbidity 3+;Age;Time since  onset of injury/illness/exacerbation    Comorbidities  see above    Examination-Activity Limitations  Bathing;Bend;Carry;Dressing;Hygiene/Grooming;Lift;Toileting;Stand;Stairs;Reach Overhead;Locomotion Level;Transfers    Examination-Participation Restrictions  Cleaning;Laundry;Medication Management;Meal Prep;Personal Finances    Stability/Clinical Decision Making  Evolving/Moderate complexity    Rehab Potential  Good    PT Frequency  2x / week    PT Duration  8 weeks    PT Treatment/Interventions  ADLs/Self Care Home Management;Electrical Stimulation;DME Instruction;Gait training;Stair training;Functional mobility training;Therapeutic activities;Therapeutic exercise;Balance training;Neuromuscular re-education;Cognitive remediation;Patient/family education;Orthotic Fit/Training;Passive range of motion;Energy conservation;Vestibular    PT Next Visit Plan  Review HEP and progress as able (pay attention to R knee pain); discuss walking program for home (maybe trial several minutes of gait in clinic and discuss walking several times per day at home for exercise)    Recommended Other Services  Ask wife about f/u for social worker  ([facility]    Consulted and Agree with Plan of Care  Patient;Family member/caregiver    Family Member Consulted  wife, dorothy       Patient will benefit from skilled therapeutic intervention in order to improve the following deficits and impairments:  Abnormal gait, Decreased activity tolerance, Decreased balance, Decreased cognition, Decreased coordination, Decreased endurance, Decreased knowledge of precautions, Decreased knowledge of use of DME, Decreased mobility, Decreased strength, Difficulty walking, Impaired perceived functional ability, Impaired flexibility, Postural dysfunction  Visit Diagnosis: Muscle weakness (generalized)  Unsteadiness on feet     Problem List Patient Active Problem List   Diagnosis Date Noted  . Encounter for power mobility device assessment 02/23/2019  . Vitamin D deficiency 02/10/2019  . Pain due to onychomycosis of toenails of both feet 07/21/2018  . Acute pain of right knee 11/07/2017  . Back pain 07/20/2015  . History of CVA with residual deficit 07/19/2015  . Depression 05/23/2015  . Environmental allergies 05/19/2014  . Healthcare maintenance 11/25/2013  . GERD (gastroesophageal reflux disease) 06/11/2012  . Benign neoplasm of colon 05/11/2012  . Dementia of frontal lobe type ([place]) 12/17/2010  . ANEMIA, NORMOCYTIC, CHRONIC 11/17/2007  . CKD (chronic kidney disease) stage 2, GFR 60-89 ml/min 04/25/2006  . Type 2 diabetes mellitus with other specified complication ([place]) [id number]  . HLD (hyperlipidemia) 04/24/2006  . Essential hypertension 04/24/2006    Arya Boxley W. 03/18/2019, 4:32 PM Frazier Butt., PT  [facility] [address] [place], [place], [address] Phone: [phone]   Fax:  [phone]  Name: DEWANE TURNBAUGH MRN: [id number] Date of Birth: 1950/02/03

## 2019-03-18 NOTE — Telephone Encounter (Signed)
DME order for rolling walker placed per PT request.

## 2019-03-19 ENCOUNTER — Encounter: Payer: Self-pay | Admitting: Rehabilitative and Restorative Service Providers"

## 2019-03-19 ENCOUNTER — Ambulatory Visit: Payer: PPO | Admitting: Rehabilitative and Restorative Service Providers"

## 2019-03-19 DIAGNOSIS — R2681 Unsteadiness on feet: Secondary | ICD-10-CM

## 2019-03-19 DIAGNOSIS — R296 Repeated falls: Secondary | ICD-10-CM

## 2019-03-19 DIAGNOSIS — G8929 Other chronic pain: Secondary | ICD-10-CM

## 2019-03-19 DIAGNOSIS — M6281 Muscle weakness (generalized): Secondary | ICD-10-CM | POA: Diagnosis not present

## 2019-03-19 DIAGNOSIS — I69354 Hemiplegia and hemiparesis following cerebral infarction affecting left non-dominant side: Secondary | ICD-10-CM

## 2019-03-19 NOTE — Therapy (Signed)
[facility] [address] [place], [place], [address] Phone: [phone]   Fax:  [phone]  Physical Therapy Treatment  Patient Details  Name: [name] [name] MRN: [id number] Date of Birth: 1950/10/05 Referring Provider (PT): Velna Ochs, MD   Encounter Date: 03/19/2019  PT End of Session - 03/19/19 1514    Visit Number  3    Number of Visits  17    Date for PT Re-Evaluation  05/11/19    Authorization Type  HTA (10th visit progress note?)    PT Start Time  1026   patient arrived late   PT Stop Time  1100    PT Time Calculation (min)  34 min    Equipment Utilized During Treatment  Gait belt    Activity Tolerance  Patient tolerated treatment well    Behavior During Therapy  WFL for tasks assessed/performed;Flat affect       Past Medical History:  Diagnosis Date  . ANEMIA, NORMOCYTIC, CHRONIC 11/17/2007   Qualifier: Diagnosis of  By: Cruzita Lederer MD, Salena Saner    . Colon polyp 06/12/12   Colonoscopy and polypectomy by Dr. Deatra Ina revealed tubular adenoma and tubulovillous adenoma  . Dementia of frontal lobe type ([place]) 12/17/2010  . Depression   . Diabetes mellitus   . GERD (gastroesophageal reflux disease)   . HYPERLIPIDEMIA 04/24/2006   Qualifier: Diagnosis of  By: Cruzita Lederer MD, Salena Saner    . Hypertension   . RENAL INSUFFICIENCY, CHRONIC 04/25/2006   Qualifier: Diagnosis of  By: Cruzita Lederer MD, Salena Saner    . Right hip pain 11/18/2016  . Stroke [facility])    3 strokes (2004, 2006, 2007)per wife,he had 3 strokes in a month!    Past Surgical History:  Procedure Laterality Date  . COLONOSCOPY    . COLONOSCOPY N/A 06/12/2012   Procedure: COLONOSCOPY;  Surgeon: Inda Castle, MD;  [facility]: [facility];  Service: Endoscopy;  Laterality: N/A;  . COLONOSCOPY WITH PROPOFOL N/A 07/11/2014   Procedure: COLONOSCOPY WITH PROPOFOL;  Surgeon: Inda Castle, MD;  Location: [facility];  Service: Endoscopy;  Laterality: N/A;  . HOT  HEMOSTASIS N/A 06/12/2012   Procedure: HOT HEMOSTASIS (ARGON PLASMA COAGULATION/BICAP);  Surgeon: Inda Castle, MD;  Location: [facility];  Service: Endoscopy;  Laterality: N/A;  . HOT HEMOSTASIS N/A 07/11/2014   Procedure: HOT HEMOSTASIS (ARGON PLASMA COAGULATION/BICAP);  Surgeon: Inda Castle, MD;  Location: [facility];  Service: Endoscopy;  Laterality: N/A;  . POLYPECTOMY      There were no vitals filed for this visit.  Subjective Assessment - 03/19/19 1027    Subjective  No changes since yesterday. Denies any falls. Reports HEP compliance. Wife not present at today's appointment.    Patient is accompained by:  Family member   wife, [name] [name]   Limitations  Walking;Standing;Lifting    Currently in Pain?  No/denies                       [facility] Adult PT Treatment/Exercise - 03/19/19 1032      Transfers   Transfers  Sit to Stand;Stand to Sit    Sit to Stand  5: Supervision;With upper extremity assist;From chair/3-in-1   UE assist pushing on LEs    Sit to Stand Details  Verbal cues for sequencing;Verbal cues for technique   postural adjustment once standing   Stand to Sit  5: Supervision;With upper extremity assist;To bed    Stand to Sit Details (indicate cue type and  reason)  Verbal cues for sequencing;Verbal cues for technique    Number of Reps  2 sets   5 reps; 30 second seated rest between sets     Ambulation/Gait   Ambulation/Gait  Yes    Ambulation/Gait Assistance  4: Min assist;4: Min guard    Ambulation/Gait Assistance Details  Tried use of rollator for AD as this is the DME order placed by provider    Ambulation Distance (Feet)  57 Feet   x 2    Assistive device  Straight cane;Rollator    Gait Pattern  Step-through pattern;Decreased stride length;Decreased hip/knee flexion - right;Decreased hip/knee flexion - left;Shuffle;Trunk flexed;Wide base of support    Ambulation Surface  Level;Indoor    Pre-Gait Activities  Extensive time spent demo, return  demo, safety and static standing practice on use of brakes wiht intro to rollator    Gait Comments  Min improvement noted in gait mechanics with use of rollator which appears at lesat partially due to toruble following full instruction and poor safety behavior carryover from education provided pre-gait      Exercises   Exercises  Knee/Hip      Knee/Hip Exercises: Stretches   Active Hamstring Stretch  --   patient OP proximal to knee   Passive Hamstring Stretch  Right;Left;3 reps;20 seconds    Passive Hamstring Stretch Limitations  PT required to provide demo and verbal cueing for proper technique. Instructed patient to apply overpressure proximal to knee to increase stretch      Knee/Hip Exercises: Seated   Long Arc Quad  Strengthening;Right;Left;2 sets;10 reps             PT Education - 03/19/19 1513    Education Details  introduced walking program for home (3-4, 3 minute walks in his home), recommendation to delay getting new AD at this time (even with new order placed) as order is for rollator and he is not safe to ambulate with this outside PT; encouraged HEP compliance; reviewed access to social work (given last visit) and encouraged patient follow up    Person(s) Educated  Patient    Methods  Explanation    Comprehension  Verbalized understanding       PT Short Term Goals - 03/18/19 1631      PT SHORT TERM GOAL #1   Title  Pt/wife will be independent with initial HEP in order to indicate decreased fall risk and improved functional mobility.  (Target Date: 04/11/19)    Time  4    Period  Weeks    Status  New    Target Date  04/11/19      PT SHORT TERM GOAL #2   Title  Will assess BERG balance test and improve score by 4 points in order to indicate decreased fall risk.    Baseline  03/18/2019:  41/56    Time  4    Period  Weeks    Status  New      PT SHORT TERM GOAL #3   Title  Pt will improve TUG to </=15 secs with LRAD at S level in order to indicate decreased fall  risk.    Time  4    Period  Weeks    Status  New      PT SHORT TERM GOAL #4   Title  Pt will improve gait speed to >/=2.35 ft/sec w/ LRAD in order to indicate decreased fall risk.    Time  4    Period  Weeks  Status  New      PT SHORT TERM GOAL #5   Title  Pt will ambulate x 150' with RW at S level over household surfaces to indicate improved safety at home.    Time  4    Period  Weeks    Status  New      PT SHORT TERM GOAL #6   Title  Pt/wife will indicate that pt is completing 50% of his ADLs in order to indicate improved independence at home.    Time  4    Period  Weeks        PT Long Term Goals - 03/12/19 1333      PT LONG TERM GOAL #1   Title  Pt/wife will be independent with final HEP in order to indicate decreased fall risk and improved functional mobility.  (Target Date:05/11/19)    Time  8    Period  Weeks    Status  New    Target Date  05/11/19      PT LONG TERM GOAL #2   Title  Pt will improve BERG balance score by 8 points from baseline in order to indicate decreased fall risk.    Time  8    Period  Weeks    Status  New      PT LONG TERM GOAL #3   Title  Pt will perform TUG in </=13.5 secs in order to indicate decreased fall risk.    Time  8    Period  Weeks    Status  New      PT LONG TERM GOAL #4   Title  Pt will improve gait speed to >/=2.62 ft/sec w/ LRAD in order to indicate safe community ambulation.    Time  8    Period  Weeks    Status  New      PT LONG TERM GOAL #5   Title  Pt will ambulate x 150' at mod I level with LRAD in order to indicate safe household mobility.    Time  8    Period  Weeks    Status  New            Plan - 03/19/19 1515    Clinical Impression Statement  PT introduced gait training with rollator as Velna Ochs, MD has placed order for DME as he is unsafe ambulating with SPC to date. However, based on today's trial, patient is unsafe attempting ambulation with rollator outside PT. Did not trial other ADs  (2WRW), but he would benefit from trial of multiple walkers with possible plans to contact referring provider for new DME order request due to safety concerns. Patient demo easy fatigue with therapeutic exercise fostering full knee extension requiring multiple rest breaks (cued for breathing) between sets. He will benefit from continued PT in order to maximize his functional mobility and decrease fall risk.    Personal Factors and Comorbidities  Comorbidity 3+;Age;Time since onset of injury/illness/exacerbation    Comorbidities  see above    Examination-Activity Limitations  Bathing;Bend;Carry;Dressing;Hygiene/Grooming;Lift;Toileting;Stand;Stairs;Reach Overhead;Locomotion Level;Transfers    Examination-Participation Restrictions  Cleaning;Laundry;Medication Management;Meal Prep;Personal Finances    Stability/Clinical Decision Making  Evolving/Moderate complexity    Rehab Potential  Good    PT Frequency  2x / week    PT Duration  8 weeks    PT Treatment/Interventions  ADLs/Self Care Home Management;Electrical Stimulation;DME Instruction;Gait training;Stair training;Functional mobility training;Therapeutic activities;Therapeutic exercise;Balance training;Neuromuscular re-education;Cognitive remediation;Patient/family education;Orthotic Fit/Training;Passive range of motion;Energy conservation;Vestibular  PT Next Visit Plan  check on HEP and walking program compliance (??); trial 2WRW and/or continued rollator gait trianing (see DME order from Rosine Door, MD is for rollator - patient does not currently have order as he is unsafe with rollator)    Consulted and Agree with Plan of Care  Patient    Family Member Consulted  --       Patient will benefit from skilled therapeutic intervention in order to improve the following deficits and impairments:  Abnormal gait, Decreased activity tolerance, Decreased balance, Decreased cognition, Decreased coordination, Decreased endurance, Decreased knowledge of  precautions, Decreased knowledge of use of DME, Decreased mobility, Decreased strength, Difficulty walking, Impaired perceived functional ability, Impaired flexibility, Postural dysfunction  Visit Diagnosis: Muscle weakness (generalized)  Unsteadiness on feet  Chronic pain of right knee  Hemiplegia and hemiparesis following cerebral infarction affecting left non-dominant side ([place])  Repeated falls     Problem List Patient Active Problem List   Diagnosis Date Noted  . Encounter for power mobility device assessment 02/23/2019  . Vitamin D deficiency 02/10/2019  . Pain due to onychomycosis of toenails of both feet 07/21/2018  . Acute pain of right knee 11/07/2017  . Back pain 07/20/2015  . History of CVA with residual deficit 07/19/2015  . Depression 05/23/2015  . Environmental allergies 05/19/2014  . Healthcare maintenance 11/25/2013  . GERD (gastroesophageal reflux disease) 06/11/2012  . Benign neoplasm of colon 05/11/2012  . Dementia of frontal lobe type ([place]) 12/17/2010  . ANEMIA, NORMOCYTIC, CHRONIC 11/17/2007  . CKD (chronic kidney disease) stage 2, GFR 60-89 ml/min 04/25/2006  . Type 2 diabetes mellitus with other specified complication ([place]) [id number]  . HLD (hyperlipidemia) 04/24/2006  . Essential hypertension 04/24/2006    [place], PT, DPT  03/19/2019, 3:23 PM  [facility] [address] [place], [place], [address] Phone: [phone]   Fax:  [phone]  Name: DEMEATRICE KADIR MRN: [id number] Date of Birth: [birth year]

## 2019-03-22 ENCOUNTER — Telehealth: Payer: Self-pay | Admitting: *Deleted

## 2019-03-22 NOTE — Telephone Encounter (Signed)
Rx for PT Mobility/Seating Eval faxed to Andria Rhein at [facility] at [phone]. Hubbard Hartshorn, BSN, RN-BC

## 2019-03-22 NOTE — Telephone Encounter (Signed)
Thank you! Is there anything you need from me

## 2019-03-24 NOTE — Telephone Encounter (Signed)
[name] [name], Orvis Brill, RN; [place], [place], [place]   Received. Thank you!

## 2019-04-02 ENCOUNTER — Encounter: Payer: Self-pay | Admitting: Rehabilitation

## 2019-04-02 ENCOUNTER — Ambulatory Visit: Payer: PPO | Admitting: Rehabilitation

## 2019-04-02 ENCOUNTER — Other Ambulatory Visit: Payer: Self-pay

## 2019-04-02 ENCOUNTER — Telehealth: Payer: Self-pay | Admitting: Rehabilitation

## 2019-04-02 DIAGNOSIS — M6281 Muscle weakness (generalized): Secondary | ICD-10-CM | POA: Diagnosis not present

## 2019-04-02 DIAGNOSIS — I69354 Hemiplegia and hemiparesis following cerebral infarction affecting left non-dominant side: Secondary | ICD-10-CM

## 2019-04-02 DIAGNOSIS — R296 Repeated falls: Secondary | ICD-10-CM

## 2019-04-02 DIAGNOSIS — R2681 Unsteadiness on feet: Secondary | ICD-10-CM

## 2019-04-02 NOTE — Telephone Encounter (Signed)
Dr. Philipp Ovens,   I continue to see [name] [name] here at [facility] for PT.  We noted that your order stated rollator or 4 wheeled walker.  We trialed this in our clinic however he is not safe to use this device.  Could you please re-write order for rolling walker (standard rolling walker) as this provided better support and decreased fall risk.    Thanks Cameron Sprang, PT, MPT [facility] [address] [place], [place], [address] Phone: [phone]   Fax:  [phone] 04/02/19, 2:26 PM

## 2019-04-02 NOTE — Therapy (Signed)
[facility] [address] [place], [place], [address] Phone: [phone]   Fax:  [phone]  Physical Therapy Treatment  Patient Details  Name: [name] [name] MRN: [id number] Date of Birth: 1950/06/17 Referring Provider (PT): Velna Ochs, MD   Encounter Date: 04/02/2019  PT End of Session - 04/02/19 1344    Visit Number  4    Number of Visits  17    Date for PT Re-Evaluation  05/11/19    Authorization Type  HTA (10th visit progress note?)    PT Start Time  1022   PT late from previous session   PT Stop Time  1100    PT Time Calculation (min)  38 min    Equipment Utilized During Treatment  Gait belt    Activity Tolerance  Patient tolerated treatment well    Behavior During Therapy  WFL for tasks assessed/performed;Flat affect       Past Medical History:  Diagnosis Date  . ANEMIA, NORMOCYTIC, CHRONIC 11/17/2007   Qualifier: Diagnosis of  By: Cruzita Lederer MD, Salena Saner    . Colon polyp 06/12/12   Colonoscopy and polypectomy by Dr. Deatra Ina revealed tubular adenoma and tubulovillous adenoma  . Dementia of frontal lobe type ([place]) 12/17/2010  . Depression   . Diabetes mellitus   . GERD (gastroesophageal reflux disease)   . HYPERLIPIDEMIA 04/24/2006   Qualifier: Diagnosis of  By: Cruzita Lederer MD, Salena Saner    . Hypertension   . RENAL INSUFFICIENCY, CHRONIC 04/25/2006   Qualifier: Diagnosis of  By: Cruzita Lederer MD, Salena Saner    . Right hip pain 11/18/2016  . Stroke [facility])    3 strokes (2004, 2006, 2007)per wife,he had 3 strokes in a month!    Past Surgical History:  Procedure Laterality Date  . COLONOSCOPY    . COLONOSCOPY N/A 06/12/2012   Procedure: COLONOSCOPY;  Surgeon: Inda Castle, MD;  Location: [facility];  Service: Endoscopy;  Laterality: N/A;  . COLONOSCOPY WITH PROPOFOL N/A 07/11/2014   Procedure: COLONOSCOPY WITH PROPOFOL;  Surgeon: Inda Castle, MD;  Location: [facility];  Service: Endoscopy;  Laterality: N/A;  .  HOT HEMOSTASIS N/A 06/12/2012   Procedure: HOT HEMOSTASIS (ARGON PLASMA COAGULATION/BICAP);  Surgeon: Inda Castle, MD;  Location: [facility];  Service: Endoscopy;  Laterality: N/A;  . HOT HEMOSTASIS N/A 07/11/2014   Procedure: HOT HEMOSTASIS (ARGON PLASMA COAGULATION/BICAP);  Surgeon: Inda Castle, MD;  Location: [facility];  Service: Endoscopy;  Laterality: N/A;  . POLYPECTOMY      There were no vitals filed for this visit.  Subjective Assessment - 04/02/19 1030    Subjective  No changes since last visit.  No falls.    Limitations  Walking;Standing;Lifting    How long can you walk comfortably?  Only in house, but wife reports he is still very unsteady    Patient Stated Goals  Per wife, "I'm having to do everything for him and I'm getting so tired of it"    Currently in Pain?  No/denies                       [facility] Adult PT Treatment/Exercise - 04/02/19 1025      Transfers   Transfers  Sit to Stand;Stand to Sit    Comments  Sit<>stand as in HEP without UE support with RW in front for safety.  Continued cues due to poor cognitiion on technique and full upright posture with slow controlled descent.  Wife not present  during session.       Ambulation/Gait   Ambulation/Gait  Yes    Ambulation/Gait Assistance  5: Supervision;4: Min guard    Ambulation/Gait Assistance Details  Pt ambulatory into clnic with SPC that did not have rubber tip.  Educated on safety and that this could cause fall.  Recommended he have wife go to [employer] [employer] to purchse, however he wanted to look for it at home.  PT did wrap with duck tape prior to him leaving to add some traction while trying to get order for RW.  Pt verbalized understanding.  Trialed RW during session with marked improvement in stability and balance along with increased walking distance.  He does need cues for upright posture and less pressure through RW as he tends to bear down onto RW unneccesarily.  PT to contact MD again to  re-write order for RW vs rollator.  Also worked on negotiation around obstacles and tight spaces with cues for attention to the R as he tends to veer R during session.      Ambulation Distance (Feet)  115 Feet   200'x 1, 45' x 2    Assistive device  Rolling walker;Straight cane    Gait Pattern  Step-through pattern;Decreased stride length;Decreased hip/knee flexion - right;Decreased hip/knee flexion - left;Shuffle;Trunk flexed;Wide base of support    Ambulation Surface  Level;Indoor      Neuro Re-ed    Neuro Re-ed Details   In // bars briefly addressed balance reactions on compliant airex foam with feet apart, maintaining balance x 20 secs x 2 reps, feet apart EO with head turns side/side and up/down x 10 reps with tactile and visual cues (mirror) for upright posture as he tends to laterally flex trunk to the R.        Exercises   Exercises  Other Exercises    Other Exercises   Performed seated marching as in HEP with min cues for posture x 20 reps, marching forward along counter top x 4 laps at min/guard level and side stepping x 4 laps with BUE support.  PT added side stepping to HEP.               PT Education - 04/02/19 1344    Education Details  added single exercise to HEP, education to get new tip for cane for safety and that PT would contact MD regarding order for RW    Person(s) Educated  Patient    Methods  Explanation;Demonstration;Handout    Comprehension  Verbalized understanding;Returned demonstration       PT Short Term Goals - 03/18/19 1631      PT SHORT TERM GOAL #1   Title  Pt/wife will be independent with initial HEP in order to indicate decreased fall risk and improved functional mobility.  (Target Date: 04/11/19)    Time  4    Period  Weeks    Status  New    Target Date  04/11/19      PT SHORT TERM GOAL #2   Title  Will assess BERG balance test and improve score by 4 points in order to indicate decreased fall risk.    Baseline  03/18/2019:  41/56    Time  4     Period  Weeks    Status  New      PT SHORT TERM GOAL #3   Title  Pt will improve TUG to </=15 secs with LRAD at S level in order to indicate decreased fall risk.  Time  4    Period  Weeks    Status  New      PT SHORT TERM GOAL #4   Title  Pt will improve gait speed to >/=2.35 ft/sec w/ LRAD in order to indicate decreased fall risk.    Time  4    Period  Weeks    Status  New      PT SHORT TERM GOAL #5   Title  Pt will ambulate x 150' with RW at S level over household surfaces to indicate improved safety at home.    Time  4    Period  Weeks    Status  New      PT SHORT TERM GOAL #6   Title  Pt/wife will indicate that pt is completing 50% of his ADLs in order to indicate improved independence at home.    Time  4    Period  Weeks        PT Long Term Goals - 03/12/19 1333      PT LONG TERM GOAL #1   Title  Pt/wife will be independent with final HEP in order to indicate decreased fall risk and improved functional mobility.  (Target Date:05/11/19)    Time  8    Period  Weeks    Status  New    Target Date  05/11/19      PT LONG TERM GOAL #2   Title  Pt will improve BERG balance score by 8 points from baseline in order to indicate decreased fall risk.    Time  8    Period  Weeks    Status  New      PT LONG TERM GOAL #3   Title  Pt will perform TUG in </=13.5 secs in order to indicate decreased fall risk.    Time  8    Period  Weeks    Status  New      PT LONG TERM GOAL #4   Title  Pt will improve gait speed to >/=2.62 ft/sec w/ LRAD in order to indicate safe community ambulation.    Time  8    Period  Weeks    Status  New      PT LONG TERM GOAL #5   Title  Pt will ambulate x 150' at mod I level with LRAD in order to indicate safe household mobility.    Time  8    Period  Weeks    Status  New            Plan - 04/02/19 1345    Clinical Impression Statement  Skilled session focused on assessment of gait with RW as MD had placed order for rollator.  He  demo'd marked improvement in safety and stability with RW with less assist needed.  Will request new order from MD and provide at next session.  Pt continues to be somewhat limited due to poor cognition and wife not present to assist or educate/demonstrate to.    Personal Factors and Comorbidities  Comorbidity 3+;Age;Time since onset of injury/illness/exacerbation    Comorbidities  see above    Examination-Activity Limitations  Bathing;Bend;Carry;Dressing;Hygiene/Grooming;Lift;Toileting;Stand;Stairs;Reach Overhead;Locomotion Level;Transfers    Examination-Participation Restrictions  Cleaning;Laundry;Medication Management;Meal Prep;Personal Finances    Stability/Clinical Decision Making  Evolving/Moderate complexity    Rehab Potential  Good    PT Frequency  2x / week    PT Duration  8 weeks    PT Treatment/Interventions  ADLs/Self Care Home Management;Electrical Stimulation;DME Instruction;Gait  training;Stair training;Functional mobility training;Therapeutic activities;Therapeutic exercise;Balance training;Neuromuscular re-education;Cognitive remediation;Patient/family education;Orthotic Fit/Training;Passive range of motion;Energy conservation;Vestibular    PT Next Visit Plan  check on HEP as needed, did order for RW come through (provide to wife if able), continue gait training with RW, BLE strength, endurance and standing balance.    Consulted and Agree with Plan of Care  Patient       Patient will benefit from skilled therapeutic intervention in order to improve the following deficits and impairments:  Abnormal gait, Decreased activity tolerance, Decreased balance, Decreased cognition, Decreased coordination, Decreased endurance, Decreased knowledge of precautions, Decreased knowledge of use of DME, Decreased mobility, Decreased strength, Difficulty walking, Impaired perceived functional ability, Impaired flexibility, Postural dysfunction  Visit Diagnosis: Muscle weakness  (generalized)  Unsteadiness on feet  Hemiplegia and hemiparesis following cerebral infarction affecting left non-dominant side ([place])  Repeated falls     Problem List Patient Active Problem List   Diagnosis Date Noted  . Encounter for power mobility device assessment 02/23/2019  . Vitamin D deficiency 02/10/2019  . Pain due to onychomycosis of toenails of both feet 07/21/2018  . Acute pain of right knee 11/07/2017  . Back pain 07/20/2015  . History of CVA with residual deficit 07/19/2015  . Depression 05/23/2015  . Environmental allergies 05/19/2014  . Healthcare maintenance 11/25/2013  . GERD (gastroesophageal reflux disease) 06/11/2012  . Benign neoplasm of colon 05/11/2012  . Dementia of frontal lobe type ([place]) 12/17/2010  . ANEMIA, NORMOCYTIC, CHRONIC 11/17/2007  . CKD (chronic kidney disease) stage 2, GFR 60-89 ml/min 04/25/2006  . Type 2 diabetes mellitus with other specified complication ([place]) [id number]  . HLD (hyperlipidemia) 04/24/2006  . Essential hypertension 04/24/2006    Cameron Sprang, PT, MPT [facility] [address] [place], [place], [address] Phone: [phone]   Fax:  [phone] 04/02/19, 1:49 PM  Name: [name] [name] MRN: [id number] Date of Birth: 10/24/1950

## 2019-04-02 NOTE — Patient Instructions (Signed)
Access Code: [email or web address] URL: [email or web address] Date: 04/02/2019 Prepared by: Cameron Sprang  Exercises Seated March - 1 x daily - 5 x weekly - 10 reps - 2 sets Seated Knee Flexion Extension AROM - 1 x daily - 5 x weekly - 10 reps - 2 sets Seated Toe Raise - 1 x daily - 7 x weekly - 10 reps - 2 sets Seated Hamstring Stretch - 1 x daily - 7 x weekly - 3 reps - 1 sets - 30 sec hold Sit to Stand - 1 x daily - 5 x weekly - 5 reps - 1-2 sets Side Stepping with Counter Support - 1 x daily - 7 x weekly - 1 sets - 4 reps

## 2019-04-05 ENCOUNTER — Other Ambulatory Visit: Payer: Self-pay

## 2019-04-05 ENCOUNTER — Ambulatory Visit: Payer: PPO | Admitting: Physical Therapy

## 2019-04-05 DIAGNOSIS — M6281 Muscle weakness (generalized): Secondary | ICD-10-CM | POA: Diagnosis not present

## 2019-04-05 DIAGNOSIS — R2681 Unsteadiness on feet: Secondary | ICD-10-CM

## 2019-04-05 NOTE — Therapy (Signed)
[facility] [address] [place], [place], [address] Phone: [phone]   Fax:  [phone]  Physical Therapy Treatment  Patient Details  Name: [name] [name] MRN: [id number] Date of Birth: [birth year] Referring Provider (PT): Velna Ochs, MD   Encounter Date: 04/05/2019  PT End of Session - 04/05/19 1216    Visit Number  5    Number of Visits  17    Date for PT Re-Evaluation  05/11/19    Authorization Type  HTA (10th visit progress note?)    PT Start Time  0814   Pt arrived late to therapy   PT Stop Time  0844    PT Time Calculation (min)  30 min    Equipment Utilized During Treatment  Gait belt    Activity Tolerance  Patient tolerated treatment well    Behavior During Therapy  [facility] for tasks assessed/performed;Flat affect       Past Medical History:  Diagnosis Date  . ANEMIA, NORMOCYTIC, CHRONIC 11/17/2007   Qualifier: Diagnosis of  By: Cruzita Lederer MD, Salena Saner    . Colon polyp 06/12/12   Colonoscopy and polypectomy by Dr. Deatra Ina revealed tubular adenoma and tubulovillous adenoma  . Dementia of frontal lobe type ([place]) 12/17/2010  . Depression   . Diabetes mellitus   . GERD (gastroesophageal reflux disease)   . HYPERLIPIDEMIA 04/24/2006   Qualifier: Diagnosis of  By: Cruzita Lederer MD, Salena Saner    . Hypertension   . RENAL INSUFFICIENCY, CHRONIC 04/25/2006   Qualifier: Diagnosis of  By: Cruzita Lederer MD, Salena Saner    . Right hip pain 11/18/2016  . Stroke [facility])    3 strokes (2004, 2006, 2007)per wife,he had 3 strokes in a month!    Past Surgical History:  Procedure Laterality Date  . COLONOSCOPY    . COLONOSCOPY N/A 06/12/2012   Procedure: COLONOSCOPY;  Surgeon: Inda Castle, MD;  Location: [facility];  Service: Endoscopy;  Laterality: N/A;  . COLONOSCOPY WITH PROPOFOL N/A 07/11/2014   Procedure: COLONOSCOPY WITH PROPOFOL;  Surgeon: Inda Castle, MD;  Location: [facility];  Service: Endoscopy;  Laterality: N/A;  .  HOT HEMOSTASIS N/A 06/12/2012   Procedure: HOT HEMOSTASIS (ARGON PLASMA COAGULATION/BICAP);  Surgeon: Inda Castle, MD;  Location: [facility];  Service: Endoscopy;  Laterality: N/A;  . HOT HEMOSTASIS N/A 07/11/2014   Procedure: HOT HEMOSTASIS (ARGON PLASMA COAGULATION/BICAP);  Surgeon: Inda Castle, MD;  Location: [facility];  Service: Endoscopy;  Laterality: N/A;  . POLYPECTOMY      There were no vitals filed for this visit.  Subjective Assessment - 04/05/19 0816    Subjective  No changes, no falls.  Nothing new since last visit.    Limitations  Walking;Standing;Lifting    How long can you walk comfortably?  Only in house, but wife reports he is still very unsteady    Patient Stated Goals  Per wife, "I'm having to do everything for him and I'm getting so tired of it"    Currently in Pain?  No/denies            Therapeutic Exercise Reviewed HEP, including addition from last visit:  Seated March - 1 x daily - 5 x weekly - 10 reps - 2 sets (performed x 10) Seated Knee Flexion Extension AROM - 1 x daily - 5 x weekly - 10 reps - 2 sets (performed x 10) Seated Toe Raise - 1 x daily - 7 x weekly - 10 reps - 2 sets (performed x  10) Seated Hamstring Stretch - 1 x daily - 7 x weekly - 3 reps - 1 sets - 30 sec hold (performed 2 reps x 30 sec) Sit to Stand - 1 x daily - 5 x weekly - 5 reps - 1-2 sets (performed 2 sets x 5) Side Stepping with Counter Support - 1 x daily - 7 x weekly - 1 sets - 4 reps (performed x 4 reps along counter)  Required verbal and demo or visual cues from [place] as reminder for each exercise.            [facility] Adult PT Treatment/Exercise - 04/05/19 0831      Ambulation/Gait   Ambulation/Gait  Yes    Ambulation/Gait Assistance  5: Supervision;4: Min guard    Ambulation/Gait Assistance Details  Cues for posture, decreased strong lean through UEs onto RW; cues for sequence of using RW to turn to sit.    Ambulation Distance (Feet)  170 Feet   70  with RW; 60 ft x 2 with pt's cane   Assistive device  Rolling walker;Straight cane    Gait Pattern  Step-through pattern;Decreased stride length;Decreased hip/knee flexion - right;Decreased hip/knee flexion - left;Shuffle;Trunk flexed;Wide base of support    Ambulation Surface  Level;Indoor    Gait Comments  PT checked chart for RW order, no new order for rolling walker from MD yet.               PT Short Term Goals - 03/18/19 1631      PT SHORT TERM GOAL #1   Title  Pt/wife will be independent with initial HEP in order to indicate decreased fall risk and improved functional mobility.  (Target Date: 04/11/19)    Time  4    Period  Weeks    Status  New    Target Date  04/11/19      PT SHORT TERM GOAL #2   Title  Will assess BERG balance test and improve score by 4 points in order to indicate decreased fall risk.    Baseline  03/18/2019:  41/56    Time  4    Period  Weeks    Status  New      PT SHORT TERM GOAL #3   Title  Pt will improve TUG to </=15 secs with LRAD at S level in order to indicate decreased fall risk.    Time  4    Period  Weeks    Status  New      PT SHORT TERM GOAL #4   Title  Pt will improve gait speed to >/=2.35 ft/sec w/ LRAD in order to indicate decreased fall risk.    Time  4    Period  Weeks    Status  New      PT SHORT TERM GOAL #5   Title  Pt will ambulate x 150' with RW at S level over household surfaces to indicate improved safety at home.    Time  4    Period  Weeks    Status  New      PT SHORT TERM GOAL #6   Title  Pt/wife will indicate that pt is completing 50% of his ADLs in order to indicate improved independence at home.    Time  4    Period  Weeks        PT Long Term Goals - 03/12/19 1333      PT LONG TERM GOAL #1   Title  Pt/wife will be independent with final HEP in order to indicate decreased fall risk and improved functional mobility.  (Target Date:05/11/19)    Time  8    Period  Weeks    Status  New    Target Date   05/11/19      PT LONG TERM GOAL #2   Title  Pt will improve BERG balance score by 8 points from baseline in order to indicate decreased fall risk.    Time  8    Period  Weeks    Status  New      PT LONG TERM GOAL #3   Title  Pt will perform TUG in </=13.5 secs in order to indicate decreased fall risk.    Time  8    Period  Weeks    Status  New      PT LONG TERM GOAL #4   Title  Pt will improve gait speed to >/=2.62 ft/sec w/ LRAD in order to indicate safe community ambulation.    Time  8    Period  Weeks    Status  New      PT LONG TERM GOAL #5   Title  Pt will ambulate x 150' at mod I level with LRAD in order to indicate safe household mobility.    Time  8    Period  Weeks    Status  New            Plan - 04/05/19 1216    Clinical Impression Statement  Fully reviewed HEP this visit, with pt needing verbal and visual cues from [place] for return demo of HEP.  Gait training with RW, with pt noted improved stability with gait versus with his cane.  Pt will continue to beneift from skilled PT to further address gait training and safety with RW.  No new order yet received from MD for rolling walker.    Personal Factors and Comorbidities  Comorbidity 3+;Age;Time since onset of injury/illness/exacerbation    Comorbidities  see above    Examination-Activity Limitations  Bathing;Bend;Carry;Dressing;Hygiene/Grooming;Lift;Toileting;Stand;Stairs;Reach Overhead;Locomotion Level;Transfers    Examination-Participation Restrictions  Cleaning;Laundry;Medication Management;Meal Prep;Personal Finances    Stability/Clinical Decision Making  Evolving/Moderate complexity    Rehab Potential  Good    PT Frequency  2x / week    PT Duration  8 weeks    PT Treatment/Interventions  ADLs/Self Care Home Management;Electrical Stimulation;DME Instruction;Gait training;Stair training;Functional mobility training;Therapeutic activities;Therapeutic exercise;Balance training;Neuromuscular  re-education;Cognitive remediation;Patient/family education;Orthotic Fit/Training;Passive range of motion;Energy conservation;Vestibular    PT Next Visit Plan  Did order for RW come through (provide to wife if able), continue gait training with RW, BLE strength, endurance and standing balance.  *STGs are due this week; requested pt make more appointments, as they only had through 3/26    Consulted and Agree with Plan of Care  Patient       Patient will benefit from skilled therapeutic intervention in order to improve the following deficits and impairments:  Abnormal gait, Decreased activity tolerance, Decreased balance, Decreased cognition, Decreased coordination, Decreased endurance, Decreased knowledge of precautions, Decreased knowledge of use of DME, Decreased mobility, Decreased strength, Difficulty walking, Impaired perceived functional ability, Impaired flexibility, Postural dysfunction  Visit Diagnosis: Muscle weakness (generalized)  Unsteadiness on feet     Problem List Patient Active Problem List   Diagnosis Date Noted  . Encounter for power mobility device assessment 02/23/2019  . Vitamin D deficiency 02/10/2019  . Pain due to onychomycosis of toenails of both feet 07/21/2018  .  Acute pain of right knee 11/07/2017  . Back pain 07/20/2015  . History of CVA with residual deficit 07/19/2015  . Depression 05/23/2015  . Environmental allergies 05/19/2014  . Healthcare maintenance 11/25/2013  . GERD (gastroesophageal reflux disease) 06/11/2012  . Benign neoplasm of colon 05/11/2012  . Dementia of frontal lobe type ([place]) 12/17/2010  . ANEMIA, NORMOCYTIC, CHRONIC 11/17/2007  . CKD (chronic kidney disease) stage 2, GFR 60-89 ml/min 04/25/2006  . Type 2 diabetes mellitus with other specified complication ([place]) [id number]  . HLD (hyperlipidemia) 04/24/2006  . Essential hypertension 04/24/2006    Frazier Butt. 04/05/2019, 12:20 PM  [facility] [address] [place], [place], [address] Phone: [phone]   Fax:  [phone]  Name: [name] [name] MRN: [id number] Date of Birth: February 20, 1950

## 2019-04-09 ENCOUNTER — Other Ambulatory Visit: Payer: Self-pay

## 2019-04-09 ENCOUNTER — Ambulatory Visit: Payer: PPO | Admitting: Rehabilitative and Restorative Service Providers"

## 2019-04-09 ENCOUNTER — Telehealth: Payer: Self-pay | Admitting: Rehabilitative and Restorative Service Providers"

## 2019-04-09 ENCOUNTER — Encounter: Payer: Self-pay | Admitting: Rehabilitative and Restorative Service Providers"

## 2019-04-09 DIAGNOSIS — R296 Repeated falls: Secondary | ICD-10-CM

## 2019-04-09 DIAGNOSIS — M6281 Muscle weakness (generalized): Secondary | ICD-10-CM

## 2019-04-09 DIAGNOSIS — R2681 Unsteadiness on feet: Secondary | ICD-10-CM

## 2019-04-09 DIAGNOSIS — I69354 Hemiplegia and hemiparesis following cerebral infarction affecting left non-dominant side: Secondary | ICD-10-CM

## 2019-04-09 NOTE — Telephone Encounter (Signed)
Done

## 2019-04-09 NOTE — Therapy (Signed)
[facility] [address] [place], [place], [address] Phone: [phone]   Fax:  [phone]  Physical Therapy Treatment  Patient Details  Name: CHAITANYA AMEDEE MRN: [id number] Date of Birth: 1950/08/15 Referring Provider (PT): Velna Ochs, MD   Encounter Date: 04/09/2019  PT End of Session - 04/09/19 1119    Visit Number  6    Number of Visits  17    Date for PT Re-Evaluation  05/11/19    Authorization Type  HTA (10th visit progress note?)    PT Start Time  1029   patient arrived late   PT Stop Time  1108    PT Time Calculation (min)  39 min    Equipment Utilized During Treatment  Gait belt    Activity Tolerance  Patient limited by fatigue    Behavior During Therapy  [facility] for tasks assessed/performed;Flat affect       Past Medical History:  Diagnosis Date  . ANEMIA, NORMOCYTIC, CHRONIC 11/17/2007   Qualifier: Diagnosis of  By: Cruzita Lederer MD, Salena Saner    . Colon polyp 06/12/12   Colonoscopy and polypectomy by Dr. Deatra Ina revealed tubular adenoma and tubulovillous adenoma  . Dementia of frontal lobe type ([place]) 12/17/2010  . Depression   . Diabetes mellitus   . GERD (gastroesophageal reflux disease)   . HYPERLIPIDEMIA 04/24/2006   Qualifier: Diagnosis of  By: Cruzita Lederer MD, Salena Saner    . Hypertension   . RENAL INSUFFICIENCY, CHRONIC 04/25/2006   Qualifier: Diagnosis of  By: Cruzita Lederer MD, Salena Saner    . Right hip pain 11/18/2016  . Stroke [facility])    3 strokes (2004, 2006, 2007)per wife,he had 3 strokes in a month!    Past Surgical History:  Procedure Laterality Date  . COLONOSCOPY    . COLONOSCOPY N/A 06/12/2012   Procedure: COLONOSCOPY;  Surgeon: Inda Castle, MD;  Location: [facility] ENDOSCOPY;  Service: Endoscopy;  Laterality: N/A;  . COLONOSCOPY WITH PROPOFOL N/A 07/11/2014   Procedure: COLONOSCOPY WITH PROPOFOL;  Surgeon: Inda Castle, MD;  Location: [facility] ENDOSCOPY;  Service: Endoscopy;  Laterality: N/A;  . HOT HEMOSTASIS  N/A 06/12/2012   Procedure: HOT HEMOSTASIS (ARGON PLASMA COAGULATION/BICAP);  Surgeon: Inda Castle, MD;  Location: [facility];  Service: Endoscopy;  Laterality: N/A;  . HOT HEMOSTASIS N/A 07/11/2014   Procedure: HOT HEMOSTASIS (ARGON PLASMA COAGULATION/BICAP);  Surgeon: Inda Castle, MD;  Location: [facility];  Service: Endoscopy;  Laterality: N/A;  . POLYPECTOMY      There were no vitals filed for this visit.  Subjective Assessment - 04/09/19 1029    Subjective  Patient reports compliance with HEP 1-2x/day. Denies any falls. Reports feeling very tired today.    Limitations  Walking;Standing;Lifting    How long can you walk comfortably?  Only in house, but wife reports he is still very unsteady    Patient Stated Goals  Per wife, "I'm having to do everything for him and I'm getting so tired of it"         [facility] PT Assessment - 04/09/19 1030      Ambulation/Gait   Ambulation Surface  Level;Indoor    Gait Comments  PT checked chart for RW order, no new order for rolling walker from MD yet. Order currently in system is for rollator, which is not safe for patient at this time.      Standardized Balance Assessment   Standardized Balance Assessment  [place]erg Balance Test      [place] Balance  Test   Sit to Stand  Able to stand without using hands and stabilize independently    Standing Unsupported  Able to stand safely 2 minutes    Sitting with Back Unsupported but Feet Supported on Floor or Stool  Able to sit safely and securely 2 minutes    Stand to Sit  Controls descent by using hands    Transfers  Able to transfer safely, definite need of hands    Standing Unsupported with Eyes Closed  Able to stand 10 seconds with supervision    Standing Unsupported with Feet Together  Able to place feet together independently and stand for 1 minute with supervision    From Standing, Reach Forward with Outstretched Arm  Can reach forward >5 cm safely (2")    From Standing Position, Pick up Object  from [place] to pick up shoe, needs supervision    From Standing Position, Turn to Look Behind Over each Shoulder  Looks behind one side only/other side shows less weight shift   less weight shift when looking left   Turn 360 Degrees  Able to turn 360 degrees safely but slowly    Standing Unsupported, Alternately Place Feet on Step/Stool  Needs assistance to keep from falling or unable to try    Standing Unsupported, One Foot in Front  Needs help to step but can hold 15 seconds    Standing on One Leg  Unable to try or needs assist to prevent fall    Total Score  35    Berg comment:  scores <36/56 indicate high risk for falls      Timed Up and Go Test   TUG  Normal TUG    Normal TUG (seconds)  38.13   with 2WRW   TUG Comments  Requires close S when performing 180* turn with 2WRW. Performed with 2WRW as this is the AD we are awaiting an order for from MD                   [facility] Adult PT Treatment/Exercise - 04/09/19 1030      Ambulation/Gait   Ambulation/Gait  Yes    Ambulation/Gait Assistance  5: Supervision;4: Min guard;4: Min assist    Ambulation/Gait Assistance Details  requires cueing for posture, gaze fwd, and intermittent cueing for sequencing techinque wiht 2WRW (especially when distracted or in busy environment; requires min guard-min A in busy environments with [facility] & RW; S only with RW in clear environments); multiple seated rest breaks required to date due to fatigue and patient's request    Ambulation Distance (Feet)  150 Feet   150 & 115 with 2WRW; 60 ft x 2 with cane/walking stick   Assistive device  Rolling walker;Straight cane    Gait Pattern  Step-through pattern;Decreased stride length;Decreased hip/knee flexion - right;Decreased hip/knee flexion - left;Shuffle;Trunk flexed;Wide base of support    Gait velocity  0.83 ft/s with RW and min guard for safety   39.56 seconds             PT Education - 04/09/19 1119    Education Details  findings from  today's STG check, importance of HEP compliance with consistency, no RW order yet    Person(s) Educated  Patient    Methods  Explanation    Comprehension  Verbalized understanding;Need further instruction       PT Short Term Goals - 04/09/19 1121      PT SHORT TERM GOAL #1   Title  Pt/wife will be independent with initial HEP in order to indicate decreased fall risk and improved functional mobility.  (Target Date: 04/11/19)    Baseline  04/09/19: states daily compliance with HEP; still has all handouts for patient's reference    Time  4    Period  Weeks    Status  Achieved    Target Date  04/11/19      PT SHORT TERM GOAL #2   Title  Will assess BERG balance test and improve score by 4 points in order to indicate decreased fall risk.    Baseline  03/18/2019: 41/56; 04/09/19: 35/56 (reported feeling especially fatigued to date)    Time  4    Period  Weeks    Status  Not Met      PT SHORT TERM GOAL #3   Title  Pt will improve TUG to </=15 secs with LRAD at S level in order to indicate decreased fall risk.    Baseline  04/09/19: 38.18 seconds with RW    Time  4    Period  Weeks    Status  Not Met      PT SHORT TERM GOAL #4   Title  Pt will improve gait speed to >/=2.35 ft/sec w/ LRAD in order to indicate decreased fall risk.    Baseline  04/09/19: 0.83 ft/s with RW    Time  4    Period  Weeks    Status  Not Met      PT SHORT TERM GOAL #5   Title  Pt will ambulate x 150' with RW at S level over household surfaces to indicate improved safety at home.    Baseline  04/09/19: demo 150 feet wiht RW with S when not fatigued; however, once fatigued, requires intermittent min guard-min A due to sequencing breakdown    Time  4    Period  Weeks    Status  Achieved      PT SHORT TERM GOAL #6   Title  Pt/wife will indicate that pt is completing 50% of his ADLs in order to indicate improved independence at home.    Baseline  04/09/19: wife not present at today's appointment to consult on this  goal, but patient reports he "watches TV, does PT exercises" on his own; wife helping with showers, food, and dressing (PT notes he requests assistance with donning/doffing jacket during session)    Time  4    Period  Weeks    Status  Not Met        PT Long Term Goals - 04/09/19 1153      PT LONG TERM GOAL #1   Title  Pt/wife will be independent with final HEP in order to indicate decreased fall risk and improved functional mobility.  (Target Date:05/11/19)    Time  8    Period  Weeks    Status  On-going      PT LONG TERM GOAL #2   Title  Pt will improve BERG balance score by 6 points from baseline in order to indicate decreased fall risk.    Baseline  04/09/19 (STG check): 35/56    Time  8    Period  Weeks    Status  Revised      PT LONG TERM GOAL #3   Title  Pt will perform TUG in </=15 secs in order to indicate decreased fall risk.    Baseline  04/09/19 (STG check): 38.18 sec with RW  Time  8    Period  Weeks    Status  Revised      PT LONG TERM GOAL #4   Title  Pt will improve gait speed to >/=2.62 ft/sec w/ LRAD in order to indicate safe community ambulation.    Time  8    Period  Weeks    Status  On-going      PT LONG TERM GOAL #5   Title  Pt will ambulate x 150' at mod I level with LRAD in order to indicate safe household mobility.    Time  8    Period  Weeks    Status  On-going            Plan - 04/09/19 1145    Clinical Impression Statement  Patient arrived late to session, and PT attempted to accommodate time; however, was limited by patient's fatigue which was both evident by today's STG check findings and reported multiple times by patient (multiple seated rest breaks required). Today's session focused on STG check where patient met 2/6 goals. Of unmet and ongoing goals - STG 2's BERG score of 35/56, STG 3's TUG with RW score of 38.18 seconds, and STG 4's gait velocity of 0.83 ft/sec with RW all indicate a high risk of falling. He is reporting consistent  HEP compliance even without wife's assistance (she is not present at today's visit to consult). Still awaiting MD order for RW, which is what today's STG check was performed with (when AD was utilized) as his safety is substantially greater with RW compared to [facility]. Of note, in light of today's STG check findings, PT slightly modified a couple LTG's target(s). He will benefit from continued skilled PT working towards [place] in order to maximize his functional mobility, safety in gait (with RW), and decrease fall risk.    Personal Factors and Comorbidities  Comorbidity 3+;Age;Time since onset of injury/illness/exacerbation    Comorbidities  see above    Examination-Activity Limitations  Bathing;Bend;Carry;Dressing;Hygiene/Grooming;Lift;Toileting;Stand;Stairs;Reach Overhead;Locomotion Level;Transfers    Examination-Participation Restrictions  Cleaning;Laundry;Medication Management;Meal Prep;Personal Finances    Stability/Clinical Decision Making  Evolving/Moderate complexity    Rehab Potential  Good    PT Frequency  2x / week    PT Duration  8 weeks    PT Treatment/Interventions  ADLs/Self Care Home Management;Electrical Stimulation;DME Instruction;Gait training;Stair training;Functional mobility training;Therapeutic activities;Therapeutic exercise;Balance training;Neuromuscular re-education;Cognitive remediation;Patient/family education;Orthotic Fit/Training;Passive range of motion;Energy conservation;Vestibular    PT Next Visit Plan  Did order for RW come through (provide to wife if able), continue gait training with RW, functional LE strengthening and balance    Consulted and Agree with Plan of Care  Patient       Patient will benefit from skilled therapeutic intervention in order to improve the following deficits and impairments:  Abnormal gait, Decreased activity tolerance, Decreased balance, Decreased cognition, Decreased coordination, Decreased endurance, Decreased knowledge of precautions,  Decreased knowledge of use of DME, Decreased mobility, Decreased strength, Difficulty walking, Impaired perceived functional ability, Impaired flexibility, Postural dysfunction  Visit Diagnosis: Muscle weakness (generalized)  Unsteadiness on feet  Repeated falls  Hemiplegia and hemiparesis following cerebral infarction affecting left non-dominant side [facility])     Problem List Patient Active Problem List   Diagnosis Date Noted  . Encounter for power mobility device assessment 02/23/2019  . Vitamin D deficiency 02/10/2019  . Pain due to onychomycosis of toenails of both feet 07/21/2018  . Acute pain of right knee 11/07/2017  . Back pain 07/20/2015  . History of  CVA with residual deficit 07/19/2015  . Depression 05/23/2015  . Environmental allergies 05/19/2014  . Healthcare maintenance 11/25/2013  . GERD (gastroesophageal reflux disease) 06/11/2012  . Benign neoplasm of colon 05/11/2012  . Dementia of frontal lobe type ([place]) 12/17/2010  . ANEMIA, NORMOCYTIC, CHRONIC 11/17/2007  . CKD (chronic kidney disease) stage 2, GFR 60-89 ml/min 04/25/2006  . Type 2 diabetes mellitus with other specified complication ([place]) 63/84/5364  . HLD (hyperlipidemia) 04/24/2006  . Essential hypertension 04/24/2006    [place], PT, DPT  04/09/2019, 11:57 AM  [facility] [address] [place], [place], [address] Phone: [phone]   Fax:  [phone]  Name: KAL CHAIT MRN: [id number] Date of Birth: 02/26/1950

## 2019-04-09 NOTE — Telephone Encounter (Signed)
Dr. Philipp Ovens,   I continue to see [name] [name] here at [facility] for PT.  We noted that your order stated rollator or 4 wheeled walker.  We trialed this in our clinic however he is not safe to use this device.  Could you please re-write order for 2 wheeled rolling walker (standard rolling walker) as this provided better support and decreased fall risk.    Juliann Pulse, PT, DPT

## 2019-04-19 DIAGNOSIS — R262 Difficulty in walking, not elsewhere classified: Secondary | ICD-10-CM | POA: Diagnosis not present

## 2019-04-22 ENCOUNTER — Other Ambulatory Visit: Payer: Self-pay | Admitting: Internal Medicine

## 2019-04-22 ENCOUNTER — Ambulatory Visit: Payer: PPO | Attending: Internal Medicine

## 2019-04-22 DIAGNOSIS — F32A Depression, unspecified: Secondary | ICD-10-CM

## 2019-04-22 DIAGNOSIS — Z23 Encounter for immunization: Secondary | ICD-10-CM

## 2019-04-22 DIAGNOSIS — F329 Major depressive disorder, single episode, unspecified: Secondary | ICD-10-CM

## 2019-04-22 DIAGNOSIS — F0280 Dementia in other diseases classified elsewhere without behavioral disturbance: Secondary | ICD-10-CM

## 2019-04-22 NOTE — Progress Notes (Signed)
   Covid-19 Vaccination Clinic  Name:  TOBEN LETIZIA    MRN: [id number] DOB: [birth year]  04/22/2019  Mr. Bossi was observed post Covid-19 immunization for 15 minutes without incident. He was provided with Vaccine Information Sheet and instruction to access the V-Safe system.   Mr. Radford was instructed to call 911 with any severe reactions post vaccine: Marland Kitchen Difficulty breathing  . Swelling of face and throat  . A fast heartbeat  . A bad rash all over body  . Dizziness and weakness   Immunizations Administered    Name Date Dose VIS Date Route   Pfizer COVID-19 Vaccine 04/22/2019  9:49 AM 0.3 mL 12/25/2018 Intramuscular   Manufacturer: [place]   Lot: [id number]   [place]: [id number]

## 2019-04-26 ENCOUNTER — Ambulatory Visit: Payer: PPO | Attending: Internal Medicine | Admitting: Rehabilitation

## 2019-04-26 ENCOUNTER — Ambulatory Visit: Payer: Self-pay

## 2019-04-26 DIAGNOSIS — R2681 Unsteadiness on feet: Secondary | ICD-10-CM | POA: Insufficient documentation

## 2019-04-26 DIAGNOSIS — R296 Repeated falls: Secondary | ICD-10-CM | POA: Insufficient documentation

## 2019-04-26 DIAGNOSIS — M6281 Muscle weakness (generalized): Secondary | ICD-10-CM | POA: Insufficient documentation

## 2019-04-26 DIAGNOSIS — I69354 Hemiplegia and hemiparesis following cerebral infarction affecting left non-dominant side: Secondary | ICD-10-CM | POA: Insufficient documentation

## 2019-04-26 NOTE — Chronic Care Management (AMB) (Signed)
  Chronic Care Management   Outreach Note  04/26/2019 Name: [name] [name] MRN: [id number] DOB: [birth year]  Referred by: Aldine Contes, MD Reason for referral : Care Coordination (PACE/Caregiver assistance)   An unsuccessful telephone outreach was attempted today. The patient was referred to the case management team for assistance with care management and care coordination.   Follow Up Plan: A HIPPA compliant phone message was left for the patient providing contact information and requesting a return call. Will attempt to reach again on 04/28/19 if no return call before then      [employer], [place] Worker [place] [phone]

## 2019-04-28 ENCOUNTER — Ambulatory Visit: Payer: Self-pay

## 2019-04-28 NOTE — Chronic Care Management (AMB) (Signed)
  Chronic Care Management   Outreach Note  04/28/2019 Name: [name] [name] MRN: [id number] DOB: [birth year]  Referred by: Aldine Contes, MD Reason for referral : Care Coordination ([place])   A second unsuccessful telephone outreach was attempted today. The patient was referred to the case management team for assistance with care management and care coordination.   Follow Up Plan: A HIPPA compliant phone message was left for the patient providing contact information and requesting a return call.  Will attempt to reach again on 05/03/19 if no return call.       Ronn Melena, [place] Coordination Social Worker [place] [phone]

## 2019-04-29 ENCOUNTER — Other Ambulatory Visit: Payer: Self-pay

## 2019-04-29 ENCOUNTER — Ambulatory Visit: Payer: Self-pay

## 2019-04-29 ENCOUNTER — Ambulatory Visit: Payer: PPO | Admitting: Physical Therapy

## 2019-04-29 DIAGNOSIS — R296 Repeated falls: Secondary | ICD-10-CM | POA: Diagnosis not present

## 2019-04-29 DIAGNOSIS — R2681 Unsteadiness on feet: Secondary | ICD-10-CM

## 2019-04-29 DIAGNOSIS — M6281 Muscle weakness (generalized): Secondary | ICD-10-CM | POA: Diagnosis not present

## 2019-04-29 DIAGNOSIS — I69354 Hemiplegia and hemiparesis following cerebral infarction affecting left non-dominant side: Secondary | ICD-10-CM | POA: Diagnosis not present

## 2019-04-29 NOTE — Progress Notes (Signed)
Internal Medicine Clinic Resident  I have personally reviewed this encounter including the documentation in this note and/or discussed this patient with the care management provider. I will address any urgent items identified by the care management provider and will communicate my actions to the patient's PCP. I have reviewed the patient's CCM visit with my supervising attending.  Kessie Croston, MD 04/29/2019    

## 2019-04-29 NOTE — Patient Instructions (Signed)
To get the rolling walker, these are a few places you could go in [place].  [place].  [address]

## 2019-04-29 NOTE — Chronic Care Management (AMB) (Signed)
  Chronic Care Management   Outreach Note  04/29/2019 Name: [name] [name] MRN: [id number] DOB: April 10, 1950  Referred by: Aldine Contes, MD Reason for referral : Care Coordination (PACE/Caregiver assistance)   Third unsuccessful telephone outreach was attempted today. The patient was referred to the case management team for assistance with care management and care coordination. The patient's primary care provider has been notified of our unsuccessful attempts to make or maintain contact with the patient. T   Follow Up Plan: A HIPPA compliant phone message was left and a letter was mailed providing contact information and requesting a return call.   Will attempt to reach patient/spouse again within the next two weeks if no response to letter.        Ronn Melena, [place] Coordination Social Worker [place] [phone]

## 2019-04-29 NOTE — Therapy (Signed)
[facility] [address] [place], [place], [address] Phone: [phone]   Fax:  [phone]  Physical Therapy Treatment  Patient Details  Name: CRIT OBREMSKI MRN: [id number] Date of Birth: November 04, 1950 Referring Provider (PT): Velna Ochs, MD   Encounter Date: 04/29/2019  PT End of Session - 04/29/19 1121    Visit Number  7    Number of Visits  17    Date for PT Re-Evaluation  05/11/19    Authorization Type  HTA (10th visit progress note?)    Progress Note Due on Visit  10    PT Start Time  1024   Pt arrives late   PT Stop Time  1105    PT Time Calculation (min)  41 min    Equipment Utilized During Treatment  Gait belt    Activity Tolerance  Patient limited by fatigue    Behavior During Therapy  [facility] for tasks assessed/performed;Flat affect       Past Medical History:  Diagnosis Date  . ANEMIA, NORMOCYTIC, CHRONIC 11/17/2007   Qualifier: Diagnosis of  By: Cruzita Lederer MD, Salena Saner    . Colon polyp 06/12/12   Colonoscopy and polypectomy by Dr. Deatra Ina revealed tubular adenoma and tubulovillous adenoma  . Dementia of frontal lobe type ([place]) 12/17/2010  . Depression   . Diabetes mellitus   . GERD (gastroesophageal reflux disease)   . HYPERLIPIDEMIA 04/24/2006   Qualifier: Diagnosis of  By: Cruzita Lederer MD, Salena Saner    . Hypertension   . RENAL INSUFFICIENCY, CHRONIC 04/25/2006   Qualifier: Diagnosis of  By: Cruzita Lederer MD, Salena Saner    . Right hip pain 11/18/2016  . Stroke [facility])    3 strokes (2004, 2006, 2007)per wife,he had 3 strokes in a month!    Past Surgical History:  Procedure Laterality Date  . COLONOSCOPY    . COLONOSCOPY N/A 06/12/2012   Procedure: COLONOSCOPY;  Surgeon: Inda Castle, MD;  Location: [facility] ENDOSCOPY;  Service: Endoscopy;  Laterality: N/A;  . COLONOSCOPY WITH PROPOFOL N/A 07/11/2014   Procedure: COLONOSCOPY WITH PROPOFOL;  Surgeon: Inda Castle, MD;  Location: [facility] ENDOSCOPY;  Service: Endoscopy;   Laterality: N/A;  . HOT HEMOSTASIS N/A 06/12/2012   Procedure: HOT HEMOSTASIS (ARGON PLASMA COAGULATION/BICAP);  Surgeon: Inda Castle, MD;  Location: [facility];  Service: Endoscopy;  Laterality: N/A;  . HOT HEMOSTASIS N/A 07/11/2014   Procedure: HOT HEMOSTASIS (ARGON PLASMA COAGULATION/BICAP);  Surgeon: Inda Castle, MD;  Location: [facility] ENDOSCOPY;  Service: Endoscopy;  Laterality: N/A;  . POLYPECTOMY      There were no vitals filed for this visit.  Subjective Assessment - 04/29/19 1026    Subjective  No changes since last visit.  No falls per patient report.    Limitations  Walking;Standing;Lifting    How long can you walk comfortably?  Only in house, but wife reports he is still very unsteady    Patient Stated Goals  Per wife, "I'm having to do everything for him and I'm getting so tired of it"    Currently in Pain?  No/denies                       [facility] Adult PT Treatment/Exercise - 04/29/19 1039      Transfers   Transfers  Sit to Stand;Stand to Sit    Sit to Stand  5: Supervision;With upper extremity assist;From chair/3-in-1    Stand to Sit  5: Supervision;With upper extremity assist;To bed  Comments  Cues for hand placement with use of RW with transfers/initiation of gait.      Ambulation/Gait   Ambulation/Gait  Yes    Ambulation/Gait Assistance  4: Min guard;4: Min assist   Min assist at times to avoid obstacles on R side   Ambulation/Gait Assistance Details  Requires cues to stay close to RW, cues for turning, and for reaching back with stand>sit.     Ambulation Distance (Feet)  110 Feet   70 ft x 4 reps with RW, including turns to change directions   Assistive device  Rolling walker;Straight cane    Gait Pattern  Step-through pattern;Decreased stride length;Decreased hip/knee flexion - right;Decreased hip/knee flexion - left;Shuffle;Trunk flexed;Wide base of support    Ambulation Surface  Level;Indoor    Pre-Gait Activities  Cues to stay within BOS  of walker.    Gait Comments  Gait in gym area, including R and L turns to negotiate around furniture.  Pt tends to veer to the right, initially needs assistance, then able to correct to straight path with VCs.  Pt ambulates from back of gym to parking lot to his car using RW, with min guard, cues to avoid veering to the right and cues to slow RW and pick up as needed for doorframe and unlevel sidewalk area.  Pt demonstrates good understanding of use of RW on paved short distance outdoor surfaces.      High Level Balance   High Level Balance Activities  Figure 8 turns    High Level Balance Comments  Using chairs as obstacles for figure-8 turns, using RW to negotiate with min guard.  VCs for tighter turns and avoiding obstacles.      Self-Care   Self-Care  Other Self-Care Comments    Other Self-Care Comments   Upon walking pt out to car, PT provides copy of order for RW to wife.  Provided list of DME locations nearby to purchase RW for patient use.  Discussed pt's occasional veering with RW and need for close supervision even with walker, but the walker will give him added stability over cane.  Wife questions gettting a rollator 4-wheeled RW with seat.  Explained that rollator had been trialed in earlier sessions and pt is unsafe with use of rollator.  Discussed with patient and wife remaining visits in [place] (as pt had previously cancelled/no-showed due to refusing to return to therapy).  Pt is agreeable today to continuing remaining PT visits towards LTGs.             PT Education - 04/29/19 1120    Education Details  Provided written order for rolling walker, list of DME locations to pt/wife; explained safety concerns with RW (veering to R, still needing close supervision); discussed POC and remaining visits.    Person(s) Educated  Patient;Spouse   wife at end of session-had to go out to car with patient to speak to wife   Methods  Explanation;Handout    Comprehension  Verbalized understanding        PT Short Term Goals - 04/09/19 1121      PT SHORT TERM GOAL #1   Title  Pt/wife will be independent with initial HEP in order to indicate decreased fall risk and improved functional mobility.  (Target Date: 04/11/19)    Baseline  04/09/19: states daily compliance with HEP; still has all handouts for patient's reference    Time  4    Period  Weeks    Status  Achieved  Target Date  04/11/19      PT SHORT TERM GOAL #2   Title  Will assess BERG balance test and improve score by 4 points in order to indicate decreased fall risk.    Baseline  03/18/2019: 41/56; 04/09/19: 35/56 (reported feeling especially fatigued to date)    Time  4    Period  Weeks    Status  Not Met      PT SHORT TERM GOAL #3   Title  Pt will improve TUG to </=15 secs with LRAD at S level in order to indicate decreased fall risk.    Baseline  04/09/19: 38.18 seconds with RW    Time  4    Period  Weeks    Status  Not Met      PT SHORT TERM GOAL #4   Title  Pt will improve gait speed to >/=2.35 ft/sec w/ LRAD in order to indicate decreased fall risk.    Baseline  04/09/19: 0.83 ft/s with RW    Time  4    Period  Weeks    Status  Not Met      PT SHORT TERM GOAL #5   Title  Pt will ambulate x 150' with RW at S level over household surfaces to indicate improved safety at home.    Baseline  04/09/19: demo 150 feet wiht RW with S when not fatigued; however, once fatigued, requires intermittent min guard-min A due to sequencing breakdown    Time  4    Period  Weeks    Status  Achieved      PT SHORT TERM GOAL #6   Title  Pt/wife will indicate that pt is completing 50% of his ADLs in order to indicate improved independence at home.    Baseline  04/09/19: wife not present at today's appointment to consult on this goal, but patient reports he "watches TV, does PT exercises" on his own; wife helping with showers, food, and dressing (PT notes he requests assistance with donning/doffing jacket during session)    Time  4     Period  Weeks    Status  Not Met        PT Long Term Goals - 04/09/19 1153      PT LONG TERM GOAL #1   Title  Pt/wife will be independent with final HEP in order to indicate decreased fall risk and improved functional mobility.  (Target Date:05/11/19)    Time  8    Period  Weeks    Status  On-going      PT LONG TERM GOAL #2   Title  Pt will improve BERG balance score by 6 points from baseline in order to indicate decreased fall risk.    Baseline  04/09/19 (STG check): 35/56    Time  8    Period  Weeks    Status  Revised      PT LONG TERM GOAL #3   Title  Pt will perform TUG in </=15 secs in order to indicate decreased fall risk.    Baseline  04/09/19 (STG check): 38.18 sec with RW    Time  8    Period  Weeks    Status  Revised      PT LONG TERM GOAL #4   Title  Pt will improve gait speed to >/=2.62 ft/sec w/ LRAD in order to indicate safe community ambulation.    Time  8    Period  Weeks  Status  On-going      PT LONG TERM GOAL #5   Title  Pt will ambulate x 150' at mod I level with LRAD in order to indicate safe household mobility.    Time  8    Period  Weeks    Status  On-going            Plan - 04/29/19 1121    Clinical Impression Statement  Order has been received for rolling walker, with focus of today's session on gait training with clinic RW and education on how to obtain RW for home for patient.  Worked on straight line gait, turns, negotiating furniture and tight spaces with RW, with cueing needed and supervision/min guard.  Pt does veer to R with walker and is able to correct with cues.  Provided education to wife at end of session (she did not come in for session, but we went out to meet her) on use of RW.  Pt will benefit from additional gait training as well as review and updates to HEP for strengthening and balance.    Personal Factors and Comorbidities  Comorbidity 3+;Age;Time since onset of injury/illness/exacerbation    Comorbidities  see above     Examination-Activity Limitations  Bathing;Bend;Carry;Dressing;Hygiene/Grooming;Lift;Toileting;Stand;Stairs;Reach Overhead;Locomotion Level;Transfers    Examination-Participation Restrictions  Cleaning;Laundry;Medication Management;Meal Prep;Personal Finances    Stability/Clinical Decision Making  Evolving/Moderate complexity    Rehab Potential  Good    PT Frequency  2x / week    PT Duration  8 weeks    PT Treatment/Interventions  ADLs/Self Care Home Management;Electrical Stimulation;DME Instruction;Gait training;Stair training;Functional mobility training;Therapeutic activities;Therapeutic exercise;Balance training;Neuromuscular re-education;Cognitive remediation;Patient/family education;Orthotic Fit/Training;Passive range of motion;Energy conservation;Vestibular    PT Next Visit Plan  Did pt get his own RW?  If so and if he brings it in, make sure it is correct height.  Functional lower extremity stregnthening and balance exercises (review and revise HEP as needed)    Consulted and Agree with Plan of Care  Patient;Family member/caregiver   wife at end of session   Family Member Consulted  wife, dorothy       Patient will benefit from skilled therapeutic intervention in order to improve the following deficits and impairments:  Abnormal gait, Decreased activity tolerance, Decreased balance, Decreased cognition, Decreased coordination, Decreased endurance, Decreased knowledge of precautions, Decreased knowledge of use of DME, Decreased mobility, Decreased strength, Difficulty walking, Impaired perceived functional ability, Impaired flexibility, Postural dysfunction  Visit Diagnosis: Unsteadiness on feet     Problem List Patient Active Problem List   Diagnosis Date Noted  . Encounter for power mobility device assessment 02/23/2019  . Vitamin D deficiency 02/10/2019  . Pain due to onychomycosis of toenails of both feet 07/21/2018  . Acute pain of right knee 11/07/2017  . Back pain 07/20/2015   . History of CVA with residual deficit 07/19/2015  . Depression 05/23/2015  . Environmental allergies 05/19/2014  . Healthcare maintenance 11/25/2013  . GERD (gastroesophageal reflux disease) 06/11/2012  . Benign neoplasm of colon 05/11/2012  . Dementia of frontal lobe type ([place]) 12/17/2010  . ANEMIA, NORMOCYTIC, CHRONIC 11/17/2007  . CKD (chronic kidney disease) stage 2, GFR 60-89 ml/min 04/25/2006  . Type 2 diabetes mellitus with other specified complication ([place]) 30/16/0109  . HLD (hyperlipidemia) 04/24/2006  . Essential hypertension 04/24/2006    Sparrow Sanzo W. 04/29/2019, 11:26 AM Frazier Butt., PT [facility] [address] [place], [place], [address] Phone: [phone]   Fax:  [phone]  Name:  LACY TAGLIERI MRN: [id number] Date of Birth: 13-Apr-1950

## 2019-04-29 NOTE — Progress Notes (Signed)
Internal Medicine Clinic Attending  CCM services provided by the care management provider and their documentation were discussed with Dr. Jones. We reviewed the pertinent findings, urgent action items addressed by the resident and non-urgent items to be addressed by the PCP.  I agree with the assessment, diagnosis, and plan of care documented in the CCM and resident's note.  Kanai Hilger, MD 04/29/2019  

## 2019-05-03 ENCOUNTER — Ambulatory Visit: Payer: PPO | Admitting: Rehabilitation

## 2019-05-07 ENCOUNTER — Ambulatory Visit: Payer: PPO | Admitting: Rehabilitation

## 2019-05-07 ENCOUNTER — Encounter: Payer: Self-pay | Admitting: Rehabilitation

## 2019-05-07 ENCOUNTER — Other Ambulatory Visit: Payer: Self-pay

## 2019-05-07 DIAGNOSIS — R296 Repeated falls: Secondary | ICD-10-CM

## 2019-05-07 DIAGNOSIS — M6281 Muscle weakness (generalized): Secondary | ICD-10-CM

## 2019-05-07 DIAGNOSIS — R2681 Unsteadiness on feet: Secondary | ICD-10-CM | POA: Diagnosis not present

## 2019-05-07 DIAGNOSIS — I69354 Hemiplegia and hemiparesis following cerebral infarction affecting left non-dominant side: Secondary | ICD-10-CM

## 2019-05-07 NOTE — Therapy (Signed)
[facility] [address] [place], [place], [address] Phone: [phone]   Fax:  [phone]  Physical Therapy Treatment  Patient Details  Name: [name] [name] MRN: [id number] Date of Birth: 1950/01/19 Referring Provider (PT): Velna Ochs, MD   Encounter Date: 05/07/2019  PT End of Session - 05/07/19 1316    Visit Number  8    Number of Visits  17    Date for PT Re-Evaluation  05/11/19    Authorization Type  HTA (10th visit progress note?)    Progress Note Due on Visit  10    PT Start Time  1102    PT Stop Time  1145    PT Time Calculation (min)  43 min    Equipment Utilized During Treatment  Gait belt    Activity Tolerance  Patient limited by fatigue    Behavior During Therapy  [facility] for tasks assessed/performed;Flat affect       Past Medical History:  Diagnosis Date  . ANEMIA, NORMOCYTIC, CHRONIC 11/17/2007   Qualifier: Diagnosis of  By: Cruzita Lederer MD, Salena Saner    . Colon polyp 06/12/12   Colonoscopy and polypectomy by Dr. Deatra Ina revealed tubular adenoma and tubulovillous adenoma  . Dementia of frontal lobe type ([place]) 12/17/2010  . Depression   . Diabetes mellitus   . GERD (gastroesophageal reflux disease)   . HYPERLIPIDEMIA 04/24/2006   Qualifier: Diagnosis of  By: Cruzita Lederer MD, Salena Saner    . Hypertension   . RENAL INSUFFICIENCY, CHRONIC 04/25/2006   Qualifier: Diagnosis of  By: Cruzita Lederer MD, Salena Saner    . Right hip pain 11/18/2016  . Stroke [facility])    3 strokes (2004, 2006, 2007)per wife,he had 3 strokes in a month!    Past Surgical History:  Procedure Laterality Date  . COLONOSCOPY    . COLONOSCOPY N/A 06/12/2012   Procedure: COLONOSCOPY;  Surgeon: Inda Castle, MD;  Location: [facility] ENDOSCOPY;  Service: Endoscopy;  Laterality: N/A;  . COLONOSCOPY WITH PROPOFOL N/A 07/11/2014   Procedure: COLONOSCOPY WITH PROPOFOL;  Surgeon: Inda Castle, MD;  Location: [facility] ENDOSCOPY;  Service: Endoscopy;  Laterality: N/A;  .  HOT HEMOSTASIS N/A 06/12/2012   Procedure: HOT HEMOSTASIS (ARGON PLASMA COAGULATION/BICAP);  Surgeon: Inda Castle, MD;  Location: [facility];  Service: Endoscopy;  Laterality: N/A;  . HOT HEMOSTASIS N/A 07/11/2014   Procedure: HOT HEMOSTASIS (ARGON PLASMA COAGULATION/BICAP);  Surgeon: Inda Castle, MD;  Location: [facility] ENDOSCOPY;  Service: Endoscopy;  Laterality: N/A;  . POLYPECTOMY      There were no vitals filed for this visit.  Subjective Assessment - 05/07/19 1105    Subjective  No changes, no pain today.    Limitations  Walking;Standing;Lifting    How long can you walk comfortably?  Only in house, but wife reports he is still very unsteady    Patient Stated Goals  Per wife, "I'm having to do everything for him and I'm getting so tired of it"    Currently in Pain?  No/denies                       [facility] PT Treatment/Exercise - 05/07/19 1121      Ambulation/Gait   Ambulation/Gait  Yes    Ambulation/Gait Assistance  4: Min guard    Ambulation/Gait Assistance Details  Continue to work on gait with RW around gym x 115' with cues for wider step, upright posture and keeping feet inside of RW when turning.  Progressed to negotiating around obstacles in tight spaces as well as over small threshold like barriers.  Pt did well with going around obstacles, however had marked difficulty with stepping over obstacles with max cues for sequencing and technique.  Did obstacles x 50'    Ambulation Distance (Feet)  115 Feet   +50' and 115' x 1   Assistive device  Rolling walker    Gait Pattern  Step-through pattern;Decreased stride length;Decreased hip/knee flexion - right;Decreased hip/knee flexion - left;Shuffle;Trunk flexed;Wide base of support    Ambulation Surface  Level;Indoor    Stairs  Yes    Stairs Assistance  4: Min guard    Stair Management Technique  Two rails;Step to pattern;Forwards    Number of Stairs  4    Height of Stairs  6    Ramp  4: Min assist    Ramp  Details (indicate cue type and reason)  Cues for safety and sequencing with RW    Curb  4: Min assist    Curb Details (indicate cue type and reason)  Cues and demo for sequencing and technique with RW.  He tends to place RW too far ahead of him either up or down curb step.       Self-Care   Self-Care  Other Self-Care Comments    Other Self-Care Comments   Discussed that Monday's visit is last within cert date and that it would be helpful for wife to be present so that we may go ahead and discuss D/C vs recert.  Feel that pt has continued to be inconsistent with attendance and carryover at home and still does not have RW for home use, so will likely D/C at next visit.       Therapeutic Activites    Therapeutic Activities  ADL's    ADL's  Simulated getting pants on/off and pulling up/down with use of theraband to simulate waist of pants.  Pt with some difficulty getting around feet due to difficulty with forward bend due to body habitus and hip tightness.  Demo'd use of grabber to aid in putting pants around legs then pulling up from there.  Pt able to do all at S level during session.  Continue to encourage pt to do more ADLs at home to allow more independence and allow wife to not do as much for him.  Pt verbalized understanding.              PT Education - 05/07/19 1315    Education Details  see self care    Person(s) Educated  Patient    Methods  Explanation    Comprehension  Verbalized understanding       PT Short Term Goals - 04/09/19 1121      PT SHORT TERM GOAL #1   Title  Pt/wife will be independent with initial HEP in order to indicate decreased fall risk and improved functional mobility.  (Target Date: 04/11/19)    Baseline  04/09/19: states daily compliance with HEP; still has all handouts for patient's reference    Time  4    Period  Weeks    Status  Achieved    Target Date  04/11/19      PT SHORT TERM GOAL #2   Title  Will assess BERG balance test and improve score by  4 points in order to indicate decreased fall risk.    Baseline  03/18/2019: 41/56; 04/09/19: 35/56 (reported feeling especially fatigued to date)    Time  4    Period  Weeks    Status  Not Met      PT SHORT TERM GOAL #3   Title  Pt will improve TUG to </=15 secs with LRAD at S level in order to indicate decreased fall risk.    Baseline  04/09/19: 38.18 seconds with RW    Time  4    Period  Weeks    Status  Not Met      PT SHORT TERM GOAL #4   Title  Pt will improve gait speed to >/=2.35 ft/sec w/ LRAD in order to indicate decreased fall risk.    Baseline  04/09/19: 0.83 ft/s with RW    Time  4    Period  Weeks    Status  Not Met      PT SHORT TERM GOAL #5   Title  Pt will ambulate x 150' with RW at S level over household surfaces to indicate improved safety at home.    Baseline  04/09/19: demo 150 feet wiht RW with S when not fatigued; however, once fatigued, requires intermittent min guard-min A due to sequencing breakdown    Time  4    Period  Weeks    Status  Achieved      PT SHORT TERM GOAL #6   Title  Pt/wife will indicate that pt is completing 50% of his ADLs in order to indicate improved independence at home.    Baseline  04/09/19: wife not present at today's appointment to consult on this goal, but patient reports he "watches TV, does PT exercises" on his own; wife helping with showers, food, and dressing (PT notes he requests assistance with donning/doffing jacket during session)    Time  4    Period  Weeks    Status  Not Met        PT Long Term Goals - 04/09/19 1153      PT LONG TERM GOAL #1   Title  Pt/wife will be independent with final HEP in order to indicate decreased fall risk and improved functional mobility.  (Target Date:05/11/19)    Time  8    Period  Weeks    Status  On-going      PT LONG TERM GOAL #2   Title  Pt will improve BERG balance score by 6 points from baseline in order to indicate decreased fall risk.    Baseline  04/09/19 (STG check): 35/56     Time  8    Period  Weeks    Status  Revised      PT LONG TERM GOAL #3   Title  Pt will perform TUG in </=15 secs in order to indicate decreased fall risk.    Baseline  04/09/19 (STG check): 38.18 sec with RW    Time  8    Period  Weeks    Status  Revised      PT LONG TERM GOAL #4   Title  Pt will improve gait speed to >/=2.62 ft/sec w/ LRAD in order to indicate safe community ambulation.    Time  8    Period  Weeks    Status  On-going      PT LONG TERM GOAL #5   Title  Pt will ambulate x 150' at mod I level with LRAD in order to indicate safe household mobility.    Time  8    Period  Weeks    Status  On-going  Plan - 05/07/19 1316    Clinical Impression Statement  Pt has not yet gotten RW for home use, despite previous PT providing order and places to get RW.  Continued to address gait and obstacles, ramp, curb withRW during session.  He does demonstrate improvement with  negotiation with RW, however see very little carryover as he doesn't have RW for home and reports he is not doing more to independently complete ADLs.    Personal Factors and Comorbidities  Comorbidity 3+;Age;Time since onset of injury/illness/exacerbation    Comorbidities  see above    Examination-Activity Limitations  Bathing;Bend;Carry;Dressing;Hygiene/Grooming;Lift;Toileting;Stand;Stairs;Reach Overhead;Locomotion Level;Transfers    Examination-Participation Restrictions  Cleaning;Laundry;Medication Management;Meal Prep;Personal Finances    Stability/Clinical Decision Making  Evolving/Moderate complexity    Rehab Potential  Good    PT Frequency  2x / week    PT Duration  8 weeks    PT Treatment/Interventions  ADLs/Self Care Home Management;Electrical Stimulation;DME Instruction;Gait training;Stair training;Functional mobility training;Therapeutic activities;Therapeutic exercise;Balance training;Neuromuscular re-education;Cognitive remediation;Patient/family education;Orthotic Fit/Training;Passive  range of motion;Energy conservation;Vestibular    PT Next Visit Plan  did he get RW-adjust as needed, I would plan to DC on Monday    Consulted and Agree with Plan of Care  Patient;Family member/caregiver   wife at end of session   Family Member Consulted  wife, dorothy       Patient will benefit from skilled therapeutic intervention in order to improve the following deficits and impairments:  Abnormal gait, Decreased activity tolerance, Decreased balance, Decreased cognition, Decreased coordination, Decreased endurance, Decreased knowledge of precautions, Decreased knowledge of use of DME, Decreased mobility, Decreased strength, Difficulty walking, Impaired perceived functional ability, Impaired flexibility, Postural dysfunction  Visit Diagnosis: Unsteadiness on feet  Repeated falls  Muscle weakness (generalized)  Hemiplegia and hemiparesis following cerebral infarction affecting left non-dominant side [facility])     Problem List Patient Active Problem List   Diagnosis Date Noted  . Encounter for power mobility device assessment 02/23/2019  . Vitamin D deficiency 02/10/2019  . Pain due to onychomycosis of toenails of both feet 07/21/2018  . Acute pain of right knee 11/07/2017  . Back pain 07/20/2015  . History of CVA with residual deficit 07/19/2015  . Depression 05/23/2015  . Environmental allergies 05/19/2014  . Healthcare maintenance 11/25/2013  . GERD (gastroesophageal reflux disease) 06/11/2012  . Benign neoplasm of colon 05/11/2012  . Dementia of frontal lobe type ([place]) 12/17/2010  . ANEMIA, NORMOCYTIC, CHRONIC 11/17/2007  . CKD (chronic kidney disease) stage 2, GFR 60-89 ml/min 04/25/2006  . Type 2 diabetes mellitus with other specified complication ([place]) 50/35/4656  . HLD (hyperlipidemia) 04/24/2006  . Essential hypertension 04/24/2006    Cameron Sprang, PT, MPT [facility] [address] [place], [place], [address] Phone:  [phone]   Fax:  [phone] 05/07/19, 1:20 PM' Name: [name] [name] MRN: [id number] Date of Birth: 05-Jul-1950

## 2019-05-10 ENCOUNTER — Ambulatory Visit: Payer: PPO | Admitting: Physical Therapy

## 2019-05-10 ENCOUNTER — Ambulatory Visit: Payer: PPO | Admitting: *Deleted

## 2019-05-10 ENCOUNTER — Ambulatory Visit: Payer: PPO

## 2019-05-10 ENCOUNTER — Other Ambulatory Visit: Payer: Self-pay

## 2019-05-10 DIAGNOSIS — N182 Chronic kidney disease, stage 2 (mild): Secondary | ICD-10-CM

## 2019-05-10 DIAGNOSIS — R2681 Unsteadiness on feet: Secondary | ICD-10-CM

## 2019-05-10 DIAGNOSIS — E1169 Type 2 diabetes mellitus with other specified complication: Secondary | ICD-10-CM

## 2019-05-10 DIAGNOSIS — F0280 Dementia in other diseases classified elsewhere without behavioral disturbance: Secondary | ICD-10-CM

## 2019-05-10 DIAGNOSIS — G3109 Other frontotemporal dementia: Secondary | ICD-10-CM

## 2019-05-10 NOTE — Chronic Care Management (AMB) (Signed)
Chronic Care Management    Clinical Social Work General Note  05/10/2019 Name: [name] [name] MRN: [id number] DOB: 12/20/1950  Azrael H Garlitz is a 69 y.o. year old male who is a primary care patient of Narendra, Nischal, MD. The CCM was consulted to assist the patient with Level of Care Concerns.   Mr. Pidcock was given information about Chronic Care Management services today including:  1. CCM service includes personalized support from designated clinical staff supervised by his physician, including individualized plan of care and coordination with other care providers 2. 24/7 contact phone numbers for assistance for urgent and routine care needs. 3. Service will only be billed when office clinical staff spend 20 minutes or more in a month to coordinate care. 4. Only one practitioner may furnish and bill the service in a calendar month. 5. The patient may stop CCM services at any time (effective at the end of the month) by phone call to the office staff. 6. The patient will be responsible for cost sharing (co-pay) of up to 20% of the service fee (after annual deductible is met).  Patient agreed to services and verbal consent obtained.   Review of patient status, including review of consultants reports, relevant laboratory and other test results, and collaboration with appropriate care team members and the patient's provider was performed as part of comprehensive patient evaluation and provision of chronic care management services.    SDOH (Social Determinants of Health) assessments and interventions performed:  No    Outpatient Encounter Medications as of 05/10/2019  Medication Sig  . acetaminophen (TYLENOL) 325 MG tablet Take 2 tablets (650 mg total) by mouth every 8 (eight) hours as needed (prior to physical therapy or exercise).  . amitriptyline (ELAVIL) 50 MG tablet Take 1 tablet (50 mg total) by mouth at bedtime.  . amLODipine (NORVASC) 10 MG tablet Take 1 tablet (10 mg total) by mouth  daily.  . atorvastatin (LIPITOR) 40 MG tablet Take 1 tablet (40 mg total) by mouth daily.  . Cholecalciferol (VITAMIN D) 50 MCG (2000 UT) tablet Take 1 tablet (2,000 Units total) by mouth daily.  . dipyridamole-aspirin (AGGRENOX) 200-25 MG 12hr capsule Take 1 capsule by mouth 2 (two) times daily.  . hydrochlorothiazide (MICROZIDE) 12.5 MG capsule Take 1 capsule (12.5 mg total) by mouth daily.  . losartan (COZAAR) 100 MG tablet Take 1 tablet (100 mg total) by mouth daily.  . metFORMIN (GLUCOPHAGE) 500 MG tablet Take 1 tablet (500 mg total) by mouth 2 (two) times daily with a meal.  . omeprazole (PRILOSEC) 20 MG capsule Take 1 capsule by mouth twice a day before meals  . risperiDONE (RISPERDAL) 0.5 MG tablet Take 1 tablet (0.5 mg total) by mouth 2 (two) times daily.   No facility-administered encounter medications on file as of 05/10/2019.    Goals Addressed            This Visit's Progress   . Per Caregiver "I am overwhelmed by taking care of him and want to have a life again." (pt-stated)       Current Barriers:  . Knowledge Barriers related to caregiver resources and level of care options.   Case Manager Clinical Goal(s):  . Over the next 90 days, patient will work with BSW to address needs related to level of care concerns.   . Over the next 90 days, BSW will collaborate with RN Care Manager to address care management and care coordination needs  Interventions:  . Talked with patient's   spouse/caregiver regarding reason for referral to CCM services.  . Listened to spouse as she talked about impact of providing 24/7 care for patient and not having support from other family members to assist with his care. Spouse stated many times that she is overwhelmed by caring for patient and that "something has to be done" . Educated spouse about various levels of care including personal care services, day programs(specifically PACE), and long term placement.  Spouse stated that it would be most  ideal to have a place in which patient could be temporarily cared for if she wants to go out of town or has other things that she needs to do.   . Informed spouse that any services available are very costly unless patient qualifies for Adult Medicaid.  Per spouse, patient applied for Medicaid approximately one year ago but is over income limit to qualify.   . Informed spouse that if patient does not qualify for Adult Medicaid then personal care services and day programs would have to be paid for out of pocket. . Informed spouse that patient may qualify for [place] Medicaid but this is specifically for placement.  Per spouse, patient is not open to placement. . Informed spouse that placement is not an option for patient if he is deemed competent to make his own decisions and does not wish to be placed in a facility.   . Informed spouse about Respite Program through [employer] [facility] and agreed to have someone contact her/patient to further discuss eligibility.   . Left message for Marigene Ehlers with [employer] requesting call back regarding referral to respite program.  . Requested to speak with patient but was asked to call back tomorrow due to patient having an appointment.   Nash Dimmer with RN Care Manager and patient to establish an individualized plan of care    Patient Self Care Activities:  . Spouse provides 24/7 assistance with ADL and IADL's  Initial goal documentation         Follow Up Plan: Appointment scheduled for SW follow up with client by phone on: 05/11/19         Ronn Melena, [place] Coordination Social Worker [place] [phone]

## 2019-05-10 NOTE — Therapy (Signed)
[facility] [address] [place], [place], [address] Phone: [phone]   Fax:  [phone]  Physical Therapy Treatment/Discharge Summary  Patient Details  Name: [name] [name] MRN: [id number] Date of Birth: [birth year] Referring Provider (PT): Velna Ochs, MD   Encounter Date: 05/10/2019  PT End of Session - 05/10/19 1147    Visit Number  9    Number of Visits  17    Date for PT Re-Evaluation  05/11/19    Authorization Type  HTA (10th visit progress note?)    Progress Note Due on Visit  10    PT Start Time  1122   Pt arrives late   PT Stop Time  1145    PT Time Calculation (min)  23 min    Equipment Utilized During Treatment  Gait belt    Activity Tolerance  Patient limited by fatigue    Behavior During Therapy  [facility] for tasks assessed/performed;Flat affect       Past Medical History:  Diagnosis Date  . ANEMIA, NORMOCYTIC, CHRONIC 11/17/2007   Qualifier: Diagnosis of  By: Cruzita Lederer MD, Salena Saner    . Colon polyp 06/12/12   Colonoscopy and polypectomy by Dr. Deatra Ina revealed tubular adenoma and tubulovillous adenoma  . Dementia of frontal lobe type ([place]) 12/17/2010  . Depression   . Diabetes mellitus   . GERD (gastroesophageal reflux disease)   . HYPERLIPIDEMIA 04/24/2006   Qualifier: Diagnosis of  By: Cruzita Lederer MD, Salena Saner    . Hypertension   . RENAL INSUFFICIENCY, CHRONIC 04/25/2006   Qualifier: Diagnosis of  By: Cruzita Lederer MD, Salena Saner    . Right hip pain 11/18/2016  . Stroke [facility])    3 strokes (2004, 2006, 2007)per wife,he had 3 strokes in a month!    Past Surgical History:  Procedure Laterality Date  . COLONOSCOPY    . COLONOSCOPY N/A 06/12/2012   Procedure: COLONOSCOPY;  Surgeon: Inda Castle, MD;  Location: [facility] ENDOSCOPY;  Service: Endoscopy;  Laterality: N/A;  . COLONOSCOPY WITH PROPOFOL N/A 07/11/2014   Procedure: COLONOSCOPY WITH PROPOFOL;  Surgeon: Inda Castle, MD;  Location: [facility] ENDOSCOPY;  Service:  Endoscopy;  Laterality: N/A;  . HOT HEMOSTASIS N/A 06/12/2012   Procedure: HOT HEMOSTASIS (ARGON PLASMA COAGULATION/BICAP);  Surgeon: Inda Castle, MD;  Location: [facility];  Service: Endoscopy;  Laterality: N/A;  . HOT HEMOSTASIS N/A 07/11/2014   Procedure: HOT HEMOSTASIS (ARGON PLASMA COAGULATION/BICAP);  Surgeon: Inda Castle, MD;  Location: [facility] ENDOSCOPY;  Service: Endoscopy;  Laterality: N/A;  . POLYPECTOMY      There were no vitals filed for this visit.  Subjective Assessment - 05/10/19 1125    Subjective  No changes, no pain.  Have not gotten RW yet.  Wife didn't want to come in.    Limitations  Walking;Standing;Lifting    How long can you walk comfortably?  Only in house, but wife reports he is still very unsteady    Patient Stated Goals  Per wife, "I'm having to do everything for him and I'm getting so tired of it"    Currently in Pain?  No/denies                       [facility] Adult PT Treatment/Exercise - 05/10/19 0001      Ambulation/Gait   Ambulation/Gait  Yes    Ambulation/Gait Assistance  5: Supervision    Ambulation/Gait Assistance Details  Fatigued by end of gait; asking to sit  Ambulation Distance (Feet)  170 Feet    Assistive device  Straight cane      Standardized Balance Assessment   Standardized Balance Assessment  Berg Balance Test;Timed Up and Go Test      Berg Balance Test   Sit to Stand  Able to stand without using hands and stabilize independently    Standing Unsupported  Able to stand safely 2 minutes    Sitting with Back Unsupported but Feet Supported on Floor or Stool  Able to sit safely and securely 2 minutes    Stand to Sit  Sits safely with minimal use of hands    Transfers  Able to transfer safely, minor use of hands    Standing Unsupported with Eyes Closed  Able to stand 10 seconds safely    Standing Ubsupported with Feet Together  Able to place feet together independently and stand 1 minute safely    From Standing, Reach  Forward with Outstretched Arm  Can reach confidently >25 cm (10")    From Standing Position, Pick up Object from Floor  Able to pick up shoe, needs supervision    From Standing Position, Turn to Look Behind Over each Shoulder  Looks behind one side only/other side shows less weight shift    Turn 360 Degrees  Able to turn 360 degrees safely but slowly    Standing Unsupported, Alternately Place Feet on Step/Stool  Needs assistance to keep from falling or unable to try    Standing Unsupported, One Foot in Front  Able to plae foot ahead of the other independently and hold 30 seconds    Standing on One Leg  Unable to try or needs assist to prevent fall    Total Score  43      Timed Up and Go Test   TUG  Normal TUG    Normal TUG (seconds)  21.75               PT Short Term Goals - 04/09/19 1121      PT SHORT TERM GOAL #1   Title  Pt/wife will be independent with initial HEP in order to indicate decreased fall risk and improved functional mobility.  (Target Date: 04/11/19)    Baseline  04/09/19: states daily compliance with HEP; still has all handouts for patient's reference    Time  4    Period  Weeks    Status  Achieved    Target Date  04/11/19      PT SHORT TERM GOAL #2   Title  Will assess BERG balance test and improve score by 4 points in order to indicate decreased fall risk.    Baseline  03/18/2019: 41/56; 04/09/19: 35/56 (reported feeling especially fatigued to date)    Time  4    Period  Weeks    Status  Not Met      PT SHORT TERM GOAL #3   Title  Pt will improve TUG to </=15 secs with LRAD at S level in order to indicate decreased fall risk.    Baseline  04/09/19: 38.18 seconds with RW    Time  4    Period  Weeks    Status  Not Met      PT SHORT TERM GOAL #4   Title  Pt will improve gait speed to >/=2.35 ft/sec w/ LRAD in order to indicate decreased fall risk.    Baseline  04/09/19: 0.83 ft/s with RW    Time  4  Period  Weeks    Status  Not Met      PT SHORT TERM  GOAL #5   Title  Pt will ambulate x 150' with RW at S level over household surfaces to indicate improved safety at home.    Baseline  04/09/19: demo 150 feet wiht RW with S when not fatigued; however, once fatigued, requires intermittent min guard-min A due to sequencing breakdown    Time  4    Period  Weeks    Status  Achieved      PT SHORT TERM GOAL #6   Title  Pt/wife will indicate that pt is completing 50% of his ADLs in order to indicate improved independence at home.    Baseline  04/09/19: wife not present at today's appointment to consult on this goal, but patient reports he "watches TV, does PT exercises" on his own; wife helping with showers, food, and dressing (PT notes he requests assistance with donning/doffing jacket during session)    Time  4    Period  Weeks    Status  Not Met        PT Long Term Goals - 05/10/19 1126      PT LONG TERM GOAL #1   Title  Pt/wife will be independent with final HEP in order to indicate decreased fall risk and improved functional mobility.  (Target Date:05/11/19)    Baseline  Pt reports not doing exercises at home.    Time  8    Period  Weeks    Status  Not Met      PT LONG TERM GOAL #2   Title  Pt will improve BERG balance score by 6 points from baseline in order to indicate decreased fall risk.    Baseline  04/09/19 (STG check): 35/56; 43/56 05/10/2019 (baseline was 41/56)    Time  8    Period  Weeks    Status  Not Met      PT LONG TERM GOAL #3   Title  Pt will perform TUG in </=15 secs in order to indicate decreased fall risk.    Baseline  04/09/19 (STG check): 38.18 sec with RW; >20 sec 05/10/2019    Time  8    Period  Weeks    Status  Not Met      PT LONG TERM GOAL #4   Title  Pt will improve gait speed to >/=2.62 ft/sec w/ LRAD in order to indicate safe community ambulation.    Baseline  1.61 ft/sec with SPC    Time  8    Period  Weeks    Status  Not Met      PT LONG TERM GOAL #5   Title  Pt will ambulate x 150' at mod I level  with LRAD in order to indicate safe household mobility.    Baseline  supervision x 170 ft    Time  8    Period  Weeks    Status  Partially Met            Plan - 05/10/19 1148    Clinical Impression Statement  Pt arrives >20 minutes late for PT session today, and he has not yet gotten a RW for home.  In addition, he reports not doing his HEP.  Assessed LTGs this visit, as it is last scheduled visit in current certification period; he has not met any goals.  Wife has not been present for education, further limiting carryover with PT recommendations.  Provided written and verbal instructions again today that based on fall risk, pt would benefit from use of rolling walker.  Pt is discharged from PT this visit.    Personal Factors and Comorbidities  Comorbidity 3+;Age;Time since onset of injury/illness/exacerbation    Comorbidities  see above    Examination-Activity Limitations  Bathing;Bend;Carry;Dressing;Hygiene/Grooming;Lift;Toileting;Stand;Stairs;Reach Overhead;Locomotion Level;Transfers    Examination-Participation Restrictions  Cleaning;Laundry;Medication Management;Meal Prep;Personal Finances    Stability/Clinical Decision Making  Evolving/Moderate complexity    Rehab Potential  Good    PT Frequency  2x / week    PT Duration  8 weeks    PT Treatment/Interventions  ADLs/Self Care Home Management;Electrical Stimulation;DME Instruction;Gait training;Stair training;Functional mobility training;Therapeutic activities;Therapeutic exercise;Balance training;Neuromuscular re-education;Cognitive remediation;Patient/family education;Orthotic Fit/Training;Passive range of motion;Energy conservation;Vestibular    PT Next Visit Plan  D/C this visit    Consulted and Agree with Plan of Care  Patient;Family member/caregiver   wife at end of session   Family Member Consulted  wife, dorothy       Patient will benefit from skilled therapeutic intervention in order to improve the following deficits and  impairments:  Abnormal gait, Decreased activity tolerance, Decreased balance, Decreased cognition, Decreased coordination, Decreased endurance, Decreased knowledge of precautions, Decreased knowledge of use of DME, Decreased mobility, Decreased strength, Difficulty walking, Impaired perceived functional ability, Impaired flexibility, Postural dysfunction  Visit Diagnosis: Unsteadiness on feet     Problem List Patient Active Problem List   Diagnosis Date Noted  . Encounter for power mobility device assessment 02/23/2019  . Vitamin D deficiency 02/10/2019  . Pain due to onychomycosis of toenails of both feet 07/21/2018  . Acute pain of right knee 11/07/2017  . Back pain 07/20/2015  . History of CVA with residual deficit 07/19/2015  . Depression 05/23/2015  . Environmental allergies 05/19/2014  . Healthcare maintenance 11/25/2013  . GERD (gastroesophageal reflux disease) 06/11/2012  . Benign neoplasm of colon 05/11/2012  . Dementia of frontal lobe type ([place]) 12/17/2010  . ANEMIA, NORMOCYTIC, CHRONIC 11/17/2007  . CKD (chronic kidney disease) stage 2, GFR 60-89 ml/min 04/25/2006  . Type 2 diabetes mellitus with other specified complication ([place]) 27/74/1287  . HLD (hyperlipidemia) 04/24/2006  . Essential hypertension 04/24/2006    Tauren Delbuono W. 05/10/2019, 11:50 AM  Frazier Butt., PT  [facility] [address] [place], [place], [address] Phone: [phone]   Fax:  [phone]  Name: [name] [name] MRN: [id number] Date of Birth: 1950-08-15   PHYSICAL THERAPY DISCHARGE SUMMARY  Visits from Start of Care: 9  Current functional level related to goals / functional outcomes: PT Long Term Goals - 05/10/19 1126      PT LONG TERM GOAL #1   Title  Pt/wife will be independent with final HEP in order to indicate decreased fall risk and improved functional mobility.  (Target Date:05/11/19)    Baseline  Pt reports not doing  exercises at home.    Time  8    Period  Weeks    Status  Not Met      PT LONG TERM GOAL #2   Title  Pt will improve BERG balance score by 6 points from baseline in order to indicate decreased fall risk.    Baseline  04/09/19 (STG check): 35/56; 43/56 05/10/2019 (baseline was 41/56)    Time  8    Period  Weeks    Status  Not Met      PT LONG TERM GOAL #3   Title  Pt will perform TUG  in </=15 secs in order to indicate decreased fall risk.    Baseline  04/09/19 (STG check): 38.18 sec with RW; >20 sec 05/10/2019    Time  8    Period  Weeks    Status  Not Met      PT LONG TERM GOAL #4   Title  Pt will improve gait speed to >/=2.62 ft/sec w/ LRAD in order to indicate safe community ambulation.    Baseline  1.61 ft/sec with SPC    Time  8    Period  Weeks    Status  Not Met      PT LONG TERM GOAL #5   Title  Pt will ambulate x 150' at mod I level with LRAD in order to indicate safe household mobility.    Baseline  supervision x 170 ft    Time  8    Period  Weeks    Status  Partially Met      Pt has not met any LTGs.   Remaining deficits: Fall risk per TUG, BERG, gait velocity measures   Education / Equipment: Educated in [employer], use of RW for safety with gait (pt has not yet gotten RW for home; reports not doing HEP)  Plan: Patient agrees to discharge.  Patient goals were not met. Patient is being discharged due to lack of progress.  ?????    Mady Haagensen, PT 05/10/19 11:52 AM Phone: [phone] Fax: [phone]

## 2019-05-10 NOTE — Patient Instructions (Signed)
You should have the order for the Rolling Walker, which [name] [name] would benefit from, as he is at a high risk of falls.  Please try to contact a medical equipment store to get a rolling walker for him to use.  Today was his last visit in his plan of care; we discharged [name] [name] from physical therapy today.  (His later visit this week was cancelled.)  Please contact us if he has any physical therapy needs in the future. [phone]

## 2019-05-10 NOTE — Chronic Care Management (AMB) (Signed)
Chronic Care Management   Initial Visit Note  05/10/2019 Name: [name] [name] MRN: [id number] DOB: 04-07-1950  Referred by: [name] Contes, MD Reason for referral : Chronic Care Management (DM, HTN, CKD,dementia)   [name] [name] is a 69 y.o. year old male who is a primary care patient of [name] Contes, MD. The CCM team was consulted for assistance with chronic disease management and care coordination needs related to HTN, HLD, DMII, CKD Stage 2, Depression and Dementia  Review of patient status, including review of consultants reports, relevant laboratory and other test results, and collaboration with appropriate care team members and the patient's provider was performed as part of comprehensive patient evaluation and provision of chronic care management services.    SDOH (Social Determinants of Health) assessments performed: No See Care Plan activities for detailed interventions related to SDOH     Medications: Outpatient Encounter Medications as of 05/10/2019  Medication Sig  . acetaminophen (TYLENOL) 325 MG tablet Take 2 tablets (650 mg total) by mouth every 8 (eight) hours as needed (prior to physical therapy or exercise).  Marland Kitchen amitriptyline (ELAVIL) 50 MG tablet Take 1 tablet (50 mg total) by mouth at bedtime.  Marland Kitchen amLODipine (NORVASC) 10 MG tablet Take 1 tablet (10 mg total) by mouth daily.  Marland Kitchen atorvastatin (LIPITOR) 40 MG tablet Take 1 tablet (40 mg total) by mouth daily.  . Cholecalciferol (VITAMIN D) 50 MCG (2000 UT) tablet Take 1 tablet (2,000 Units total) by mouth daily.  Marland Kitchen dipyridamole-aspirin (AGGRENOX) 200-25 MG 12hr capsule Take 1 capsule by mouth 2 (two) times daily.  . hydrochlorothiazide (MICROZIDE) 12.5 MG capsule Take 1 capsule (12.5 mg total) by mouth daily.  Marland Kitchen losartan (COZAAR) 100 MG tablet Take 1 tablet (100 mg total) by mouth daily.  . metFORMIN (GLUCOPHAGE) 500 MG tablet Take 1 tablet (500 mg total) by mouth 2 (two) times daily with a meal.  . omeprazole  (PRILOSEC) 20 MG capsule Take 1 capsule by mouth twice a day before meals  . risperiDONE (RISPERDAL) 0.5 MG tablet Take 1 tablet (0.5 mg total) by mouth 2 (two) times daily.   No facility-administered encounter medications on file as of 05/10/2019.     Objective:   Goals Addressed            This Visit's Progress     Patient Stated   . Per caregiver "I told the BSW I need help wtih his care." (pt-stated)       CARE PLAN ENTRY (see longtitudinal plan of care for additional care plan information)   Current Barriers:  . Chronic Disease Management support, education, and care coordination needs related to HTN, HLD, DMII, CKD Stage 2, Depression, and Dementia  Case Manager Clinical Goal(s):  Marland Kitchen Over the next 90 days, patient will work with BSW to address needs related to Level of care concerns in patient with HTN, HLD, DMII, CKD Stage 2, Depression, and Dementia  Interventions:  . Collaborated with BSW to initiate plan of care to address needs related to Level of care concerns in patient with HTN, HLD, DMII, CKD Stage 2, Depression, and Dementia  Patient Self Care Activities:  . Patient's wife  verbalizes understanding of plan to work with BSW to address level of care concerns . Attends all scheduled provider appointments . Patient's wife calls provider office for new concerns or questions  Initial goal documentation         [name] [name] was given information about Chronic Care Management services today including:  1. CCM service includes personalized support from designated clinical staff supervised by his physician, including individualized plan of care and coordination with other care providers 2. 24/7 contact phone numbers for assistance for urgent and routine care needs. 3. Service will only be billed when office clinical staff spend 20 minutes or more in a month to coordinate care. 4. Only one practitioner may furnish and bill the service in a calendar month. 5. The patient  may stop CCM services at any time (effective at the end of the month) by phone call to the office staff. 6. The patient will be responsible for cost sharing (co-pay) of up to 20% of the service fee (after annual deductible is met).  Patient's wife agreed to services and verbal consent obtained.   Plan:   The care management team will reach out to the patient again over the next 90 days.   Kelli Churn RN, CCM, [facility] RN Care Manager [phone]

## 2019-05-10 NOTE — Patient Instructions (Signed)
Visit Information  Goals Addressed            This Visit's Progress     Patient Stated   . Per caregiver "I told the BSW I need help wtih his care." (pt-stated)       CARE PLAN ENTRY (see longtitudinal plan of care for additional care plan information)   Current Barriers:  . Chronic Disease Management support, education, and care coordination needs related to HTN, HLD, DMII, CKD Stage 2, Depression, and Dementia  Case Manager Clinical Goal(s):  Marland Kitchen Over the next 90 days, patient will work with BSW to address needs related to Level of care concerns in patient with HTN, HLD, DMII, CKD Stage 2, Depression, and Dementia  Interventions:  . Collaborated with BSW to initiate plan of care to address needs related to Level of care concerns in patient with HTN, HLD, DMII, CKD Stage 2, Depression, and Dementia  Patient Self Care Activities:  . Patient's wife  verbalizes understanding of plan to work with BSW to address level of care concerns . Attends all scheduled provider appointments . Patient's wife calls provider office for new concerns or questions  Initial goal documentation        [name] [name] was given information about Chronic Care Management services today including:  1. CCM service includes personalized support from designated clinical staff supervised by his physician, including individualized plan of care and coordination with other care providers 2. 24/7 contact phone numbers for assistance for urgent and routine care needs. 3. Service will only be billed when office clinical staff spend 20 minutes or more in a month to coordinate care. 4. Only one practitioner may furnish and bill the service in a calendar month. 5. The patient may stop CCM services at any time (effective at the end of the month) by phone call to the office staff. 6. The patient will be responsible for cost sharing (co-pay) of up to 20% of the service fee (after annual deductible is met).  Patient's wife   agreed to services and verbal consent obtained.   The patient's wife verbalized understanding of instructions provided today and declined a print copy of patient instruction materials.   The care management team will reach out to the patient again over the next 90 days.   Kelli Churn RN, CCM, [facility] RN Care Manager [phone]

## 2019-05-10 NOTE — Patient Instructions (Signed)
Visit Information  Goals Addressed            This Visit's Progress   . Per Caregiver "I am overwhelmed by taking care of him and want to have a life again." (pt-stated)       Current Barriers:  Marland Kitchen Knowledge Barriers related to caregiver resources and level of care options.   Case Manager Clinical Goal(s):  Marland Kitchen Over the next 90 days, patient will work with BSW to address needs related to level of care concerns.   . Over the next 90 days, BSW will collaborate with RN Care Manager to address care management and care coordination needs  Interventions:  . Talked with patient's spouse/caregiver regarding reason for referral to CCM services.  . Listened to spouse as she talked about impact of providing 24/7 care for patient and not having support from other family members to assist with his care. Spouse stated many times that she is overwhelmed by caring for patient and that "something has to be done" . Educated spouse about various levels of care including personal care services, day programs(specifically PACE), and long term placement.  Spouse stated that it would be most ideal to have a place in which patient could be temporarily cared for if she wants to go out of town or has other things that she needs to do.   . Informed spouse that any services available are very costly unless patient qualifies for Adult Medicaid.  Per spouse, patient applied for Medicaid approximately one year ago but is over income limit to qualify.   . Informed spouse that if patient does not qualify for Adult Medicaid then personal care services and day programs would have to be paid for out of pocket. . Informed spouse that patient may qualify for [place] Medicaid but this is specifically for placement.  Per spouse, patient is not open to placement. . Informed spouse that placement is not an option for patient if he is deemed competent to make his own decisions and does not wish to be placed in a facility.    . Informed spouse about Respite Program through [employer] [facility] and agreed to have someone contact her/patient to further discuss eligibility.   . Left message for Marigene Ehlers with [employer] requesting call back regarding referral to respite program.  . Requested to speak with patient but was asked to call back tomorrow due to patient having an appointment.   Nash Dimmer with RN Care Manager and patient to establish an individualized plan of care    Patient Self Care Activities:  . Spouse provides 24/7 assistance with ADL and IADL's  Initial goal documentation        Patient verbalizes understanding of instructions provided today.   Telephone follow up appointment with care management team member scheduled for:05/11/19    Ronn Melena, [place] Coordination Social Worker [place] [phone]

## 2019-05-11 ENCOUNTER — Ambulatory Visit: Payer: PPO

## 2019-05-11 DIAGNOSIS — F0280 Dementia in other diseases classified elsewhere without behavioral disturbance: Secondary | ICD-10-CM

## 2019-05-11 DIAGNOSIS — N182 Chronic kidney disease, stage 2 (mild): Secondary | ICD-10-CM

## 2019-05-11 DIAGNOSIS — E1169 Type 2 diabetes mellitus with other specified complication: Secondary | ICD-10-CM

## 2019-05-11 NOTE — Progress Notes (Signed)
Internal Medicine Clinic Resident   I have personally reviewed this encounter including the documentation in this note and/or discussed this patient with the care management provider. I will address any urgent items identified by the care management provider and will communicate my actions to the patient's PCP. I have reviewed the patient's CCM visit with my supervising attending.  Brandon Zenas Santa, MD 05/11/2019    

## 2019-05-11 NOTE — Chronic Care Management (AMB) (Signed)
Care Management   Follow Up Note   05/11/2019 Name: JAIDIN SHKOLNIK MRN: [id number] DOB: 1950-06-11  Referred by: Aldine Contes, MD Reason for referral : Care Coordination (Caregiver Resources)   BRAYSON SHERFEY is a 69 y.o. year old male who is a primary care patient of Aldine Contes, MD. The care management team was consulted for assistance with care management and care coordination needs.    Review of patient status, including review of consultants reports, relevant laboratory and other test results, and collaboration with appropriate care team members and the patient's provider was performed as part of comprehensive patient evaluation and provision of chronic care management services.    SDOH (Social Determinants of Health) assessments performed: No See Care Plan activities for detailed interventions related to [facility])     Advanced Directives: See Care Plan and Vynca application for related entries.   Goals Addressed            This Visit's Progress   . Per Caregiver "I am overwhelmed by taking care of him and want to have a life again." (pt-stated)       Current Barriers:  Marland Kitchen Knowledge Barriers related to caregiver resources and level of care options.   Case Manager Clinical Goal(s):  Marland Kitchen Over the next 90 days, patient will work with BSW to address needs related to level of care concerns.   . Over the next 90 days, BSW will collaborate with RN Care Manager to address care management and care coordination needs  Interventions:  . Talked with Marigene Ehlers from [employer] regarding respite program.  Eligibility for program is 11 years of age or older, requires assistance with at least 2 ADL's, caregiver resides in [place] area, .  Patient's that are assessed and eligible for program receive either a $500 or $1000 voucher that can be used toward caregiver services.  [employer] is contracted with four local agencies.  Ms. Nicki Reaper reports that fiscal year ends on 07/14/19  and funding will start over 07/15/19.  Vouchers for program are temporary, not ongoing.  Ms. Nicki Reaper requested a call back at the end of the week to get update on funding available through remainder of fiscal year.  Marland Kitchen Researched Project Care program through [facility].  Currently out of funding but fiscal year begins 07/15/19 . Contacted Manufacturing engineer at the recommendation of Ms. Scott.  Inquired specifically about respite services.  Representative discussed eligibility for hospice and palliative care.  Although patient does not meet criteria of life expectancy of 6 months or less, representative stated that he can still be assessed for services and some assistance may be offered to patient via hospice or palliative care.  She also stated that they are hoping for volunteer respite services to resume in June  Patient Self Care Activities:  . Patient's spouse verbalizes understanding of plan to work with BSW for caregiver resources . Attends all scheduled provider appointments . Spouse calls provider office for new concerns or questions  Please see past updates related to this goal by clicking on the "Past Updates" button in the selected goal          Attempted to contact patient's spouse today as she requested but had to leave voicemail message.  Will attempt to reach again on 05/13/19  Ronn Melena, [place] Coordination Social Worker [place] [phone]

## 2019-05-11 NOTE — Patient Instructions (Signed)
Visit Information  Goals Addressed            This Visit's Progress   . Per Caregiver "I am overwhelmed by taking care of him and want to have a life again." (pt-stated)       Current Barriers:  Marland Kitchen Knowledge Barriers related to caregiver resources and level of care options.   Case Manager Clinical Goal(s):  Marland Kitchen Over the next 90 days, patient will work with BSW to address needs related to level of care concerns.   . Over the next 90 days, BSW will collaborate with RN Care Manager to address care management and care coordination needs  Interventions:  . Talked with Marigene Ehlers from [employer] regarding respite program.  Eligibility for program is 28 years of age or older, requires assistance with at least 2 ADL's, caregiver resides in [place] area, .  Patient's that are assessed and eligible for program receive either a $500 or $1000 voucher that can be used toward caregiver services.  [employer] is contracted with four local agencies.  Ms. Nicki Reaper reports that fiscal year ends on 07/14/19 and funding will start over 07/15/19.  Vouchers for program are temporary, not ongoing.  Ms. Nicki Reaper requested a call back at the end of the week to get update on funding available through remainder of fiscal year.  Marland Kitchen Researched Project Care program through [facility].  Currently out of funding but fiscal year begins 07/15/19 . Contacted Manufacturing engineer at the recommendation of Ms. Scott.  Inquired specifically about respite services.  Representative discussed eligibility for hospice and palliative care.  Although patient does not meet criteria of life expectancy of 6 months or less, representative stated that he can still be assessed for services and some assistance may be offered to patient via hospice or palliative care.  She also stated that they are hoping for volunteer respite services to resume in June  Patient Self Care Activities:  . Patient's spouse verbalizes understanding of plan to work with  BSW for caregiver resources . Attends all scheduled provider appointments . Spouse calls provider office for new concerns or questions  Please see past updates related to this goal by clicking on the "Past Updates" button in the selected goal           Attempted to contact patient's spouse today as she requested but had to leave voicemail message.  Will attempt to reach again on 05/13/19   Ronn Melena, [place] Coordination Social Worker [place] [phone]

## 2019-05-11 NOTE — Progress Notes (Signed)
Internal Medicine Clinic Attending  CCM services provided by the care management provider and their documentation were discussed with Dr. Shan Levans. We reviewed the pertinent findings, urgent action items addressed by the resident and non-urgent items to be addressed by the PCP.  I agree with the assessment, diagnosis, and plan of care documented in the CCM and resident's note.  Larey Dresser, MD 05/11/2019

## 2019-05-11 NOTE — Progress Notes (Signed)
Internal Medicine Clinic Resident   I have personally reviewed this encounter including the documentation in this note and/or discussed this patient with the care management provider. I will address any urgent items identified by the care management provider and will communicate my actions to the patient's PCP. I have reviewed the patient's CCM visit with my supervising attending.  Brandon Larron Armor, MD 05/11/2019    

## 2019-05-12 NOTE — Progress Notes (Signed)
Internal Medicine Clinic Attending  CCM services provided by the care management provider and their documentation were discussed with Dr. Winfrey. We reviewed the pertinent findings, urgent action items addressed by the resident and non-urgent items to be addressed by the PCP.  I agree with the assessment, diagnosis, and plan of care documented in the CCM and resident's note.  Lakiah Dhingra, MD 05/12/2019  

## 2019-05-13 ENCOUNTER — Telehealth: Payer: PPO

## 2019-05-13 ENCOUNTER — Ambulatory Visit: Payer: Self-pay

## 2019-05-13 NOTE — Chronic Care Management (AMB) (Signed)
  Chronic Care Management   Outreach Note  05/13/2019 Name: [name] [name] MRN: [id number] DOB: 23-Sep-1950  Referred by: Aldine Contes, MD Reason for referral : Care Coordination (Caregiver Resources )   A second unsuccessful telephone outreach was attempted today. The patient was referred to the case management team for assistance with care management and care coordination.   Follow Up Plan: A HIPPA compliant phone message was left for the patient providing contact information and requesting a return call.      Ronn Melena, [place] Coordination Social Worker [place] [phone]

## 2019-05-14 ENCOUNTER — Ambulatory Visit: Payer: PPO | Admitting: Physical Therapy

## 2019-05-17 ENCOUNTER — Ambulatory Visit: Payer: PPO

## 2019-05-17 ENCOUNTER — Ambulatory Visit: Payer: PPO | Attending: Internal Medicine

## 2019-05-17 DIAGNOSIS — I10 Essential (primary) hypertension: Secondary | ICD-10-CM

## 2019-05-17 DIAGNOSIS — Z23 Encounter for immunization: Secondary | ICD-10-CM

## 2019-05-17 DIAGNOSIS — E1169 Type 2 diabetes mellitus with other specified complication: Secondary | ICD-10-CM

## 2019-05-17 NOTE — Progress Notes (Signed)
Internal Medicine Clinic Attending  CCM services provided by the care management provider and their documentation were discussed with Dr. Chundi. We reviewed the pertinent findings, urgent action items addressed by the resident and non-urgent items to be addressed by the PCP.  I agree with the assessment, diagnosis, and plan of care documented in the CCM and resident's note.  Gottlieb Zuercher C Kacee Koren, DO 05/17/2019  

## 2019-05-17 NOTE — Progress Notes (Signed)
   Covid-19 Vaccination Clinic  Name:  CHISTOPHER ARBANAS    MRN: [id number] DOB: 1950/06/04  05/17/2019  Mr. Purtee was observed post Covid-19 immunization for 15 minutes without incident. He was provided with Vaccine Information Sheet and instruction to access the V-Safe system.   Mr. Totino was instructed to call 911 with any severe reactions post vaccine: Marland Kitchen Difficulty breathing  . Swelling of face and throat  . A fast heartbeat  . A bad rash all over body  . Dizziness and weakness   Immunizations Administered    Name Date Dose VIS Date Route   Pfizer COVID-19 Vaccine 05/17/2019 10:41 AM 0.3 mL 03/10/2018 Intramuscular   Manufacturer: [place]   Lot: [id number]   [place]: [id number]

## 2019-05-17 NOTE — Chronic Care Management (AMB) (Signed)
  Care Management   Follow Up Note   05/17/2019 Name: YORDAN WARZECHA MRN: [id number] DOB: August 06, 1950  Referred by: Aldine Contes, MD Reason for referral : Care Coordination (Caregiver resources )   KEMONTAE NAZZAL is a 69 y.o. year old male who is a primary care patient of Aldine Contes, MD. The care management team was consulted for assistance with care management and care coordination needs.    Review of patient status, including review of consultants reports, relevant laboratory and other test results, and collaboration with appropriate care team members and the patient's provider was performed as part of comprehensive patient evaluation and provision of chronic care management services.    SDOH (Social Determinants of Health) assessments performed: No See Care Plan activities for detailed interventions related to [facility])     Advanced Directives: See Care Plan and Vynca application for related entries.   Goals Addressed            This Visit's Progress   . Per Caregiver "I am overwhelmed by taking care of him and want to have a life again." (pt-stated)       Current Barriers:  Marland Kitchen Knowledge Barriers related to caregiver resources and level of care options.   Case Manager Clinical Goal(s):  Marland Kitchen Over the next 90 days, patient will work with BSW to address needs related to level of care concerns.   . Over the next 90 days, BSW will collaborate with RN Care Manager to address care management and care coordination needs  Interventions:  . Left voicemail message for Marigene Ehlers with [facility] of [facility] requesting call back regarding status of respite program for remainder of fiscal year.    Patient Self Care Activities:  . Patient's spouse verbalizes understanding of plan to work with BSW for caregiver resources . Attends all scheduled provider appointments . Spouse calls provider office for new concerns or questions  Please see past updates related to this goal  by clicking on the "Past Updates" button in the selected goal         Third attempt to reach patient/spouse for follow up.   A HIPPA compliant phone message was left for the patient providing contact information and requesting a return call.      Ronn Melena, [place] Coordination Social Worker [place] [phone]

## 2019-05-17 NOTE — Progress Notes (Signed)
Internal Medicine Clinic Resident   I have personally reviewed this encounter including the documentation in this note and/or discussed this patient with the care management provider. I will address any urgent items identified by the care management provider and will communicate my actions to the patient's PCP. I have reviewed the patient's CCM visit with my supervising attending.  Takeya Marquis, MD 05/17/2019    

## 2019-05-17 NOTE — Patient Instructions (Signed)
Visit Information  Goals Addressed            This Visit's Progress   . Per Caregiver "I am overwhelmed by taking care of him and want to have a life again." (pt-stated)       Current Barriers:  Marland Kitchen Knowledge Barriers related to caregiver resources and level of care options.   Case Manager Clinical Goal(s):  Marland Kitchen Over the next 90 days, patient will work with BSW to address needs related to level of care concerns.   . Over the next 90 days, BSW will collaborate with RN Care Manager to address care management and care coordination needs  Interventions:  . Left voicemail message for Marigene Ehlers with [facility] of [facility] requesting call back regarding status of respite program for remainder of fiscal year.    Patient Self Care Activities:  . Patient's spouse verbalizes understanding of plan to work with BSW for caregiver resources . Attends all scheduled provider appointments . Spouse calls provider office for new concerns or questions  Please see past updates related to this goal by clicking on the "Past Updates" button in the selected goal           A HIPPA compliant phone message was left for the patient providing contact information and requesting a return call.    Ronn Melena, [place] Coordination Social Worker [place] [phone]

## 2019-05-21 ENCOUNTER — Ambulatory Visit: Payer: Self-pay | Admitting: *Deleted

## 2019-05-21 ENCOUNTER — Telehealth: Payer: PPO

## 2019-05-21 DIAGNOSIS — G3109 Other frontotemporal dementia: Secondary | ICD-10-CM

## 2019-05-21 DIAGNOSIS — I10 Essential (primary) hypertension: Secondary | ICD-10-CM

## 2019-05-21 DIAGNOSIS — E1169 Type 2 diabetes mellitus with other specified complication: Secondary | ICD-10-CM

## 2019-05-21 DIAGNOSIS — F0280 Dementia in other diseases classified elsewhere without behavioral disturbance: Secondary | ICD-10-CM

## 2019-05-21 NOTE — Chronic Care Management (AMB) (Signed)
  Chronic Care Management   Outreach Note  05/21/2019 Name: [name] [name] MRN: [id number] DOB: [birth year]  Referred by: Aldine Contes, MD Reason for referral : Chronic Care Management (dementia, HTN, DM, CKD)   An unsuccessful telephone outreach was attempted today. The patient was referred to the case management team for assistance with care management and care coordination.   Follow Up Plan: A HIPPA compliant phone message was left for the patient providing contact information and requesting a return call.  The care management team will reach out to the patient again over the next 7 days.   Kelli Churn RN, CCM, [place] [facility] RN Care Manager [phone]

## 2019-05-27 ENCOUNTER — Telehealth: Payer: PPO

## 2019-05-27 ENCOUNTER — Ambulatory Visit: Payer: Self-pay | Admitting: *Deleted

## 2019-05-27 DIAGNOSIS — E1169 Type 2 diabetes mellitus with other specified complication: Secondary | ICD-10-CM

## 2019-05-27 DIAGNOSIS — N182 Chronic kidney disease, stage 2 (mild): Secondary | ICD-10-CM

## 2019-05-27 DIAGNOSIS — G3109 Other frontotemporal dementia: Secondary | ICD-10-CM

## 2019-05-27 NOTE — Chronic Care Management (AMB) (Signed)
  Chronic Care Management   Outreach Note  05/27/2019 Name: [name] [name] MRN: [id number] DOB: [birth year]  Referred by: Aldine Contes, MD Reason for referral : Chronic Care Management (DM, HTN, CKD, HLD)   A second unsuccessful telephone outreach was attempted today. The patient was referred to the case management team for assistance with care management and care coordination.   Follow Up Plan: A HIPPA compliant phone message was left for the patient providing contact information and requesting a return call.  The care management team will reach out to the patient again over the next 7 days.   Kelli Churn RN, CCM, [facility] [facility] RN Care Manager [phone]

## 2019-06-03 ENCOUNTER — Telehealth: Payer: PPO

## 2019-06-03 ENCOUNTER — Ambulatory Visit: Payer: Self-pay | Admitting: *Deleted

## 2019-06-03 DIAGNOSIS — I10 Essential (primary) hypertension: Secondary | ICD-10-CM

## 2019-06-03 DIAGNOSIS — E1169 Type 2 diabetes mellitus with other specified complication: Secondary | ICD-10-CM

## 2019-06-03 DIAGNOSIS — G3109 Other frontotemporal dementia: Secondary | ICD-10-CM

## 2019-06-03 NOTE — Progress Notes (Signed)
Internal Medicine Clinic Resident  I have personally reviewed this encounter including the documentation in this note and/or discussed this patient with the care management provider. I will address any urgent items identified by the care management provider and will communicate my actions to the patient's PCP. I have reviewed the patient's CCM visit with my supervising attending, Dr Vincent.  Jazmen Lindenbaum, MD  Internal Medicine, PGY-1 06/03/2019    

## 2019-06-03 NOTE — Progress Notes (Signed)
Internal Medicine Clinic Attending  CCM services provided by the care management provider and their documentation were discussed with Dr. Aslam. We reviewed the pertinent findings, urgent action items addressed by the resident and non-urgent items to be addressed by the PCP.  I agree with the assessment, diagnosis, and plan of care documented in the CCM and resident's note.  Maydelin Deming [name] Kaloni Bisaillon, MD 06/03/2019  

## 2019-06-03 NOTE — Chronic Care Management (AMB) (Signed)
  Chronic Care Management   Outreach Note  06/03/2019 Name: [name] [name] MRN: [id number] DOB: 01/12/1951  Referred by: Aldine Contes, MD Reason for referral : Chronic Care Management (DM, HLD, HTN, CKD, dementia)   Third unsuccessful telephone outreach was attempted today. The patient was referred to the case management team for assistance with care management and care coordination. The patient's primary care provider has been notified of our unsuccessful attempts to make or maintain contact with the patient. The care management team is pleased to engage with this patient at any time in the future should he/she be interested in assistance from the care management team.   Follow Up Plan: The care management team is available to follow up with the patient after provider conversation with the patient regarding recommendation for care management engagement and subsequent re-referral to the care management team.   Amber Chrismon CCM BSW is assisting patient's wife with options to address caregiver burnout via adult day care vs long or short term placement.  Kelli Churn RN, CCM, [facility] RN Care Manager [phone]

## 2019-06-07 ENCOUNTER — Ambulatory Visit: Payer: Self-pay

## 2019-06-07 NOTE — Chronic Care Management (AMB) (Signed)
  Care Management   Social Work Note  06/07/2019 Name: [name] [name] MRN: [id number] DOB: February 28, 1950  [name] [name] is a 69 y.o. year old male who sees Aldine Contes, MD for primary care. The Care Management team was consulted for assistance with caregiver resources.   Unable to maintain contact with patient/caregiver after multiple attempts by CCM BSW and RN.  The care management team is available to follow up with the patient after provider conversation with the patient regarding recommendation for care management engagement and subsequent re-referral to the care management team.     Ronn Melena, [place] Worker [place] [phone]

## 2019-06-07 NOTE — Progress Notes (Signed)
Internal Medicine Clinic Attending  CCM services provided by the care management provider and their documentation were discussed with Dr. Chundi. We reviewed the pertinent findings, urgent action items addressed by the resident and non-urgent items to be addressed by the PCP.  I agree with the assessment, diagnosis, and plan of care documented in the CCM and resident's note.  Anyae Griffith, MD 06/07/2019  

## 2019-06-07 NOTE — Progress Notes (Signed)
Internal Medicine Clinic Resident  I have personally reviewed this encounter including the documentation in this note and/or discussed this patient with the care management provider. I will address any urgent items identified by the care management provider and will communicate my actions to the patient's PCP. I have reviewed the patient's CCM visit with my supervising attending, Dr Narendra.  Fahed Morten, MD 06/07/2019    

## 2019-06-21 ENCOUNTER — Telehealth: Payer: Self-pay | Admitting: Internal Medicine

## 2019-06-21 NOTE — Telephone Encounter (Signed)
Pt's wife Zackory Pudlo requesting a call back about having the patient placed in a facility.

## 2019-06-22 ENCOUNTER — Telehealth: Payer: Self-pay

## 2019-06-22 ENCOUNTER — Ambulatory Visit: Payer: Self-pay

## 2019-06-22 DIAGNOSIS — I10 Essential (primary) hypertension: Secondary | ICD-10-CM

## 2019-06-22 DIAGNOSIS — N182 Chronic kidney disease, stage 2 (mild): Secondary | ICD-10-CM

## 2019-06-22 DIAGNOSIS — E1169 Type 2 diabetes mellitus with other specified complication: Secondary | ICD-10-CM

## 2019-06-22 NOTE — Progress Notes (Signed)
Internal Medicine Clinic Resident  I have personally reviewed this encounter including the documentation in this note and/or discussed this patient with the care management provider. I will address any urgent items identified by the care management provider and will communicate my actions to the patient's PCP. I have reviewed the patient's CCM visit with my supervising attending, Dr Hoffman.  Breck [place] M Chandra Feger, MD 06/22/2019    

## 2019-06-22 NOTE — Telephone Encounter (Signed)
Pt's wife requesting to speak with you. Please call back.

## 2019-06-22 NOTE — Chronic Care Management (AMB) (Signed)
°  Chronic Care Management   Outreach Note  06/22/2019 Name: [name] [name] MRN: [id number] DOB: 1950-09-15  Referred by: Aldine Contes, MD Reason for referral : Care Coordination (Level of care/caregiver resources )   Patient was originally referred to CCM in April because spouse was seeking placement or caregiver services. Caregiver resources/services were discussed during initial conversation with spouse, however, patient does not have Medicaid so affordable options are very limited. Spouse expressed interest in placement but acknowledged that patient is not open to this.  Informed spouse that placement cannot be facilitated if patient is competent to make decision against it. Researched some other possible caregiver services for patient back in April.  Attempted to follow up with spouse X 3 but received no call back in response to voicemail messages left.  Also mailed letter and did not receive response to that.   Received communication on 06/21/19 and 06/22/19 that spouse contacted clinic requesting call back about placement or caregiver services.   Collaborated with Dr. Dareen Piano regarding patient's competency if spouse expresses interest in placement again.  Per Dr. Dareen Piano, patient seems to be competent, however, he has not been seen by a provider in some time.  It was suggested that he come to [facility] for a cognitive evaluation if needed.   Attempted to contact patient's spouse but had to leave voicemail message requesting call back.     Ronn Melena, [place] Coordination Social Worker [place] [phone]

## 2019-06-22 NOTE — Telephone Encounter (Signed)
SWO, PT Eval, and DPD for power w/c signed by Attending as Dr. Eileen Stanford is not [employer]EC[employer] enrolled at present. This has been faxed to Andria Rhein at [facility] with fax confirmation receipt received. Hubbard Hartshorn, BSN, RN-BC

## 2019-06-22 NOTE — Telephone Encounter (Signed)
CCM BSW aware that spouse is contacting clinic regarding placement for patient.  Plan to collaborate with provider prior to returning her call.       Ronn Melena, [place] Coordination Social Worker [place] [phone]

## 2019-06-28 ENCOUNTER — Telehealth: Payer: Self-pay

## 2019-06-28 NOTE — Telephone Encounter (Signed)
Patient's wife, Jezreel Justiniano, presented to [facility] and requested to speak with someone regarding getting MD to complete paperwork so she may enroll her husband (patient) in a Day/Overnight  Program for Adults.  Forms given to this RN is for a day program for adults via Division of Aging and Adult Services.  Wife states she is under the impression she will be able to "drop him off for 2-3 nights/week".  Wife states she needs help and is "stressed out".  RN suggested that wife speak to [facility] social worker, Museum/gallery conservator, regarding forms which were dropped off, as well as, potential for additional resources.  Wife is agreeable and ask that Amber call her at [phone] (cell).  As of note, there is a telephone call noted between Amber and patient's wife from 06/22/19 regarding placement.   Informed patient's wife information will be given to [facility] social worker, Museum/gallery conservator.  Also informed wife, patient may need to come in for an appointment with [facility] and she verbalized understanding.  Wife would like to speak to Education officer, museum regarding forms first. [place], RN,BSN

## 2019-06-29 ENCOUNTER — Ambulatory Visit: Payer: PPO | Admitting: *Deleted

## 2019-06-29 ENCOUNTER — Ambulatory Visit: Payer: Self-pay

## 2019-06-29 DIAGNOSIS — N182 Chronic kidney disease, stage 2 (mild): Secondary | ICD-10-CM

## 2019-06-29 DIAGNOSIS — E785 Hyperlipidemia, unspecified: Secondary | ICD-10-CM

## 2019-06-29 DIAGNOSIS — E1169 Type 2 diabetes mellitus with other specified complication: Secondary | ICD-10-CM

## 2019-06-29 NOTE — Progress Notes (Signed)
Internal Medicine Clinic Resident  I have personally reviewed this encounter including the documentation in this note and/or discussed this patient with the care management provider. I will address any urgent items identified by the care management provider and will communicate my actions to the patient's PCP. I have reviewed the patient's CCM visit with my supervising attending, Dr Guilloud.  Laylana Gerwig, MD 06/29/2019    

## 2019-06-29 NOTE — Patient Instructions (Signed)
Visit Information  Goals Addressed              This Visit's Progress   .  Per Caregiver/Spouse:  "I am overwhelemed by taking care of him and want to have a life again" (pt-stated)        Current Barriers:  Marland Kitchen Knowledge Barriers related to caregiver resources and options for long term care.  Patient initially referred to [facility] services in April 2021 as spouse/caregiver contacted clinic regarding need for caregiver resources or possible placement for patient.  Had lengthy conversation with spouse about various day programs but informed her that private pay is only option for these services unless patient qualifies for Medicaid.  Spouse reports that patient does not qualify.  Spouse previously inquired about placement for patient but stated that patient is not going to be open to this.  Informed spouse that placement cannot be facilitated if patient is competent to make decision against it.  Previously talked with Marigene Ehlers at [facility] regarding patients eligibility for their respite program.  Attempted to follow up with spouse via phone multiple times after this but did not receive return calls.  Also mailed letter with no response.  Spouse contacted clinic again on 06/21/19 and 06/22/19 regarding caregiver resources.  Left message for her on 06/22/19.  Spouse dropped off paperwork for [facility] [place] on 06/28/19 and requested assistance with getting patient in program.  Spouse was tearful during conversation today due to lack of service options for patient.  She inquired about placement for patient again.  Spouse has not discussed this with patient and says that she knows he will not be open to it.    Case Manager Clinical Goal(s):  Marland Kitchen Over the next 90 days, patient will work with BSW to address needs related to  caregiver resources.   . Over the next 90 days, BSW will collaborate with RN Care Manager to address care management and care coordination needs  Interventions:   . Called patient's spouse, as requested, to discuss [facility] [place] . Asked spouse about her understanding of eligibility for program and cost for services.  Spouse unsure of specific details of program . Contacted this respite program and spoke to representative about eligibility and application/assessment process . Called spouse back and informed her that [place] form must be completed by provider then assessment gets scheduled with staff of the program.  . Informed spouse that cost of services starts out at $60/day . Reminded spouse that, unfortunately, private pay is only option for day programs since patient does not qualify for Medicaid  . Provided spouse with contact information for Marigene Ehlers with [employer] [employer] and encouraged her to call regarding respite program. . Listened as spouse expressed frustration with lack of affordable services and that patient's family does not provide any help.   . Reminded spouse that placement cannot be facilitated if patient is deemed competent to make his own decisions   . Informed spouse that office visit can be scheduled for provider to evaluate competency of patient  . Collaborated with [facility] Counselor, Dessie Coma  . Collaborated with RN Care Manager and patient to establish an individualized plan of care    Patient Self Care Activities:  . Spouse/Caregiver provides 24/7 care for patient   Initial goal documentation        Patient;s spouse verbalizes understanding of instructions provided today.   The patient's spouse has been provided with contact  information for the care management team and has been advised to call with any health related questions or concerns.     Ronn Melena, [place] Coordination Social Worker [place] [phone]

## 2019-06-29 NOTE — Progress Notes (Signed)
Internal Medicine Clinic Attending  CCM services provided by the care management provider and their documentation were discussed with Dr. Masoudi. We reviewed the pertinent findings, urgent action items addressed by the resident and non-urgent items to be addressed by the PCP.  I agree with the assessment, diagnosis, and plan of care documented in the CCM and resident's note.  Shanasia Ibrahim, MD 06/29/2019 

## 2019-06-29 NOTE — Progress Notes (Signed)
Internal Medicine Clinic Resident  I have personally reviewed this encounter including the documentation in this note and/or discussed this patient with the care management provider. I will address any urgent items identified by the care management provider and will communicate my actions to the patient's PCP. I have reviewed the patient's CCM visit with my supervising attending, Dr Guilloud.  Anelle Parlow, MD 06/29/2019    

## 2019-06-29 NOTE — Chronic Care Management (AMB) (Signed)
Chronic Care Management   Initial Visit Note  06/29/2019 Name: [name] [name] MRN: [id number] DOB: 10-08-1950  Referred by: [name] Contes, MD Reason for referral : Chronic Care Management (DM, HTN, HLD, CKD)   [name] [name] is a 69 y.o. year old male who is a primary care patient of [name] Contes, MD. The CCM team was consulted for assistance with chronic disease management and care coordination needs related to HTN, HLD, DMII, CKD Stage 2 and Dementia  Review of patient status, including review of consultants reports, relevant laboratory and other test results, and collaboration with appropriate care team members and the patient's provider was performed as part of comprehensive patient evaluation and provision of chronic care management services.    SDOH (Social Determinants of Health) assessments performed: No See Care Plan activities for detailed interventions related to SDOH     Medications: Outpatient Encounter Medications as of 06/29/2019  Medication Sig  . acetaminophen (TYLENOL) 325 MG tablet Take 2 tablets (650 mg total) by mouth every 8 (eight) hours as needed (prior to physical therapy or exercise).  Marland Kitchen amitriptyline (ELAVIL) 50 MG tablet Take 1 tablet (50 mg total) by mouth at bedtime.  Marland Kitchen amLODipine (NORVASC) 10 MG tablet Take 1 tablet (10 mg total) by mouth daily.  Marland Kitchen atorvastatin (LIPITOR) 40 MG tablet Take 1 tablet (40 mg total) by mouth daily.  . Cholecalciferol (VITAMIN D) 50 MCG (2000 UT) tablet Take 1 tablet (2,000 Units total) by mouth daily.  Marland Kitchen dipyridamole-aspirin (AGGRENOX) 200-25 MG 12hr capsule Take 1 capsule by mouth 2 (two) times daily.  . hydrochlorothiazide (MICROZIDE) 12.5 MG capsule Take 1 capsule (12.5 mg total) by mouth daily.  Marland Kitchen losartan (COZAAR) 100 MG tablet Take 1 tablet (100 mg total) by mouth daily.  . metFORMIN (GLUCOPHAGE) 500 MG tablet Take 1 tablet (500 mg total) by mouth 2 (two) times daily with a meal.  . omeprazole (PRILOSEC) 20 MG  capsule Take 1 capsule by mouth twice a day before meals  . risperiDONE (RISPERDAL) 0.5 MG tablet Take 1 tablet (0.5 mg total) by mouth 2 (two) times daily.   No facility-administered encounter medications on file as of 06/29/2019.     Objective:  Lab Results  Component Value Date   HGBA1C 6.9 (A) 02/09/2019   HGBA1C 6.3 (A) 01/20/2018   HGBA1C 6.6 (A) 09/30/2017   Lab Results  Component Value Date   MICROALBUR 10.68 (H) 08/19/2012   LDLCALC 117 (H) 02/09/2019   CREATININE 1.73 (H) 02/09/2019  please update urine for protein  Lab Results  Component Value Date   CHOL 196 02/09/2019   HDL 42 02/09/2019   LDLCALC 117 (H) 02/09/2019   TRIG 210 (H) 02/09/2019   CHOLHDL 4.7 02/09/2019    Goals Addressed              This Visit's Progress     Patient Stated   .  Per caregiver "I told the BSW I need help with his care." (pt-stated)        CARE PLAN ENTRY (see longitudinal plan of care for additional care plan information)   Current Barriers:   Chronic Disease Management support, education, and care coordination needs related to HTN, HLD, DMII, CKD Stage 2, Depression, and Dementia   Case Manager Clinical Goal(s):   Over the next 90 days, patient will work with BSW to address needs related to Level of care concerns in patient with HTN, HLD, DMII, CKD Stage 2, Depression, and Dementia  Interventions:   Collaborated with BSW to initiate plan of care to address needs related to Level of care concerns in patient with HTN, HLD, DMII, CKD Stage 2, Depression, and Dementia   Patient Self Care Activities:   Patient's wife  verbalizes understanding of plan to work with BSW to address level of care concerns  Attends all scheduled provider appointments  Patient's wife calls provider office for new concerns or questions   Initial goal documentation           [name] [name] was given information about Chronic Care Management services today including:  1. CCM service  includes personalized support from designated clinical staff supervised by his physician, including individualized plan of care and coordination with other care providers 2. 24/7 contact phone numbers for assistance for urgent and routine care needs. 3. Service will only be billed when office clinical staff spend 20 minutes or more in a month to coordinate care. 4. Only one practitioner may furnish and bill the service in a calendar month. 5. The patient may stop CCM services at any time (effective at the end of the month) by phone call to the office staff. 6. The patient will be responsible for cost sharing (co-pay) of up to 20% of the service fee (after annual deductible is met).  Patient's wife agreed to services and verbal consent obtained.   Plan:   The care management team will reach out to the patient again over the next 90 days.   Kelli Churn RN, CCM, [facility] RN Care Manager [phone]

## 2019-06-29 NOTE — Patient Instructions (Signed)
Visit Information  Goals Addressed              This Visit's Progress     Patient Stated   .  Per caregiver "I told the BSW I need help with his care." (pt-stated)        CARE PLAN ENTRY (see longitudinal plan of care for additional care plan information)   Current Barriers:   Chronic Disease Management support, education, and care coordination needs related to HTN, HLD, DMII, CKD Stage 2, Depression, and Dementia   Case Manager Clinical Goal(s):   Over the next 90 days, patient will work with BSW to address needs related to Level of care concerns in patient with HTN, HLD, DMII, CKD Stage 2, Depression, and Dementia   Interventions:   Collaborated with BSW to initiate plan of care to address needs related to Level of care concerns in patient with HTN, HLD, DMII, CKD Stage 2, Depression, and Dementia   Patient Self Care Activities:   Patient's wife  verbalizes understanding of plan to work with BSW to address level of care concerns  Attends all scheduled provider appointments  Patient's wife calls provider office for new concerns or questions   Initial goal documentation          [name] [name] was given information about Chronic Care Management services today including:  1. CCM service includes personalized support from designated clinical staff supervised by his physician, including individualized plan of care and coordination with other care providers 2. 24/7 contact phone numbers for assistance for urgent and routine care needs. 3. Service will only be billed when office clinical staff spend 20 minutes or more in a month to coordinate care. 4. Only one practitioner may furnish and bill the service in a calendar month. 5. The patient may stop CCM services at any time (effective at the end of the month) by phone call to the office staff. 6. The patient will be responsible for cost sharing (co-pay) of up to 20% of the service fee (after annual deductible is  met).  Patient's wife agreed to services and verbal consent obtained.   The patient's wife verbalized understanding of instructions provided today and declined a print copy of patient instruction materials.   The care management team will reach out to the patient again over the next 90 days.   Kelli Churn RN, CCM, [facility] RN Care Manager [phone]

## 2019-06-29 NOTE — Chronic Care Management (AMB) (Signed)
Care Management   Follow Up Note   06/29/2019 Name: [name] [name] MRN: [id number] DOB: Mar 19, 1950  Referred by: Aldine Contes, MD Reason for referral : Care Coordination (Caregiver resources/placement)   [name] [name] is a 69 y.o. year old male who is a primary care patient of Aldine Contes, MD. The care management team was consulted for assistance with care management and care coordination needs.    Review of patient status, including review of consultants reports, relevant laboratory and other test results, and collaboration with appropriate care team members and the patient's provider was performed as part of comprehensive patient evaluation and provision of chronic care management services.    SDOH (Social Determinants of Health) assessments performed: No See Care Plan activities for detailed interventions related to [facility])     Advanced Directives: See Care Plan and [place]yn[place] application for related entries.   Goals Addressed              This Visit's Progress   .  Per Caregiver/Spouse:  "I am overwhelemed by taking care of him and want to have a life again" (pt-stated)        Current Barriers:  Marland Kitchen Knowledge Barriers related to caregiver resources and options for long term care.  Patient initially referred to [facility] services in April 2021 as spouse/caregiver contacted clinic regarding need for caregiver resources or possible placement for patient.  Had lengthy conversation with spouse about various day programs but informed her that private pay is only option for these services unless patient qualifies for Medicaid.  Spouse reports that patient does not qualify.  Spouse previously inquired about placement for patient but stated that patient is not going to be open to this.  Informed spouse that placement cannot be facilitated if patient is competent to make decision against it.  Previously talked with Marigene Ehlers at [facility] regarding patients eligibility for their respite  program.  Attempted to follow up with spouse via phone multiple times after this but did not receive return calls.  Also mailed letter with no response.  Spouse contacted clinic again on 06/21/19 and 06/22/19 regarding caregiver resources.  Left message for her on 06/22/19.  Spouse dropped off paperwork for [facility] [place] on 06/28/19 and requested assistance with getting patient in program.  Spouse was tearful during conversation today due to lack of service options for patient.  She inquired about placement for patient again.  Spouse has not discussed this with patient and says that she knows he will not be open to it.    Case Manager Clinical Goal(s):  Marland Kitchen Over the next 90 days, patient will work with BSW to address needs related to  caregiver resources.   . Over the next 90 days, BSW will collaborate with RN Care Manager to address care management and care coordination needs  Interventions:  . Called patient's spouse, as requested, to discuss [facility] [place] . Asked spouse about her understanding of eligibility for program and cost for services.  Spouse unsure of specific details of program . Contacted this respite program and spoke to representative about eligibility and application/assessment process . Called spouse back and informed her that [place] form must be completed by provider then assessment gets scheduled with staff of the program.  . Informed spouse that cost of services starts out at $60/day . Reminded spouse that, unfortunately, private pay is only option for day programs since patient does not qualify for Medicaid  .  Provided spouse with contact information for Marigene Ehlers with [employer] [employer] and encouraged her to call regarding respite program. . Listened as spouse expressed frustration with lack of affordable services and that patient's family does not provide any help.   . Reminded spouse that placement cannot be  facilitated if patient is deemed competent to make his own decisions   . Informed spouse that office visit can be scheduled for provider to evaluate competency of patient  . Collaborated with [facility] Counselor, Dessie Coma  . Collaborated with RN Care Manager and patient to establish an individualized plan of care    Patient Self Care Activities:  . Spouse/Caregiver provides 24/7 care for patient   Initial goal documentation         The patient's spouse has been provided with contact information for the care management team and has been advised to call with any health related questions or concerns.      Ronn Melena, [place] Coordination Social Worker [place] [phone]

## 2019-06-30 NOTE — Progress Notes (Signed)
Internal Medicine Clinic Attending  CCM services provided by the care management provider and their documentation were discussed with Dr. Myrtie Hawk. We reviewed the pertinent findings, urgent action items addressed by the resident and non-urgent items to be addressed by the PCP.  I agree with the assessment, diagnosis, and plan of care documented in the CCM and resident's note.  Velna Ochs, MD 06/30/2019

## 2019-07-01 ENCOUNTER — Ambulatory Visit: Payer: Self-pay

## 2019-07-01 DIAGNOSIS — E1169 Type 2 diabetes mellitus with other specified complication: Secondary | ICD-10-CM

## 2019-07-01 DIAGNOSIS — E785 Hyperlipidemia, unspecified: Secondary | ICD-10-CM

## 2019-07-01 DIAGNOSIS — N182 Chronic kidney disease, stage 2 (mild): Secondary | ICD-10-CM

## 2019-07-01 NOTE — Chronic Care Management (AMB) (Signed)
Care Management   Follow Up Note   07/01/2019 Name: [name] [name] MRN: [id number] DOB: 29-Nov-1950  Referred by: Aldine Contes, MD Reason for referral : Care Coordination (Caregiver Resources)   [name] [name] is a 69 y.o. year old male who is a primary care patient of Aldine Contes, MD. The care management team was consulted for assistance with care management and care coordination needs.    Review of patient status, including review of consultants reports, relevant laboratory and other test results, and collaboration with appropriate care team members and the patient's provider was performed as part of comprehensive patient evaluation and provision of chronic care management services.    SDOH (Social Determinants of Health) assessments performed: No See Care Plan activities for detailed interventions related to [facility])     Advanced Directives: See Care Plan and Vynca application for related entries.   Goals Addressed              This Visit's Progress   .  Per Caregiver/Spouse:  "I am overwhelemed by taking care of him and want to have a life again" (pt-stated)        Current Barriers:  Marland Kitchen Knowledge Barriers related to caregiver resources and options for long term care.  Patient initially referred to [facility] services in April 2021 as spouse/caregiver contacted clinic regarding need for caregiver resources or possible placement for patient.  Had lengthy conversation with spouse about various day programs but informed her that private pay is only option for these services unless patient qualifies for Medicaid.  Spouse reports that patient does not qualify.  Spouse previously inquired about placement for patient but stated that patient is not going to be open to this.  Informed spouse that placement cannot be facilitated if patient is competent to make decision against it.  Previously talked with Marigene Ehlers at [facility] regarding patients eligibility for their respite program.   Attempted to follow up with spouse via phone multiple times after this but did not receive return calls.  Also mailed letter with no response.  Spouse contacted clinic again on 06/21/19 and 06/22/19 regarding caregiver resources.  Left message for her on 06/22/19.  Spouse dropped off paperwork for [facility] [place] on 06/28/19 and requested assistance with getting patient in program.  Spouse was tearful during conversation today due to lack of service options for patient.  She inquired about placement for patient again.  Spouse has not discussed this with patient and says that she knows he will not be open to it.   Update 07/01/19:  Spouse was informed that there may be funding available to patient for cost of Adult [place] due to history of stroke.  Patient scheduled for clinic appointment on 07/05/19 for completion of medical information form  Case Manager Clinical Goal(s):  Marland Kitchen Over the next 90 days, patient will work with BSW to address needs related to  caregiver resources.   . Over the next 90 days, BSW will collaborate with RN Care Manager to address care management and care coordination needs  Interventions:  . Received call from patient's spouse requesting completion of Medical Information form for [facility] [place].   . Directed spouse to schedule clinic appointment for completion of form . Talked with Barkley Bruns from [facility] [place] regarding services.  She states that they have funding available to assist with cost of program for patients with history of stroke.  Unfortunately, this was not explained during last outreach to the center . Collaborated with RNCM, Kelli Churn, requesting assistance with provider completing form during office visit.    Patient Self Care Activities:  . Spouse/Caregiver provides 24/7 care for patient   Please see past updates related to this goal by clicking on the "Past Updates" button in the selected  goal          Will send medical information form to [facility] [place] once completed by provider.     Ronn Melena, [place] Coordination Social Worker [place] [phone]

## 2019-07-01 NOTE — Patient Instructions (Signed)
Visit Information  Goals Addressed              This Visit's Progress   .  Per Caregiver/Spouse:  "I am overwhelemed by taking care of him and want to have a life again" (pt-stated)        Current Barriers:  Marland Kitchen Knowledge Barriers related to caregiver resources and options for long term care.  Patient initially referred to [facility] services in April 2021 as spouse/caregiver contacted clinic regarding need for caregiver resources or possible placement for patient.  Had lengthy conversation with spouse about various day programs but informed her that private pay is only option for these services unless patient qualifies for Medicaid.  Spouse reports that patient does not qualify.  Spouse previously inquired about placement for patient but stated that patient is not going to be open to this.  Informed spouse that placement cannot be facilitated if patient is competent to make decision against it.  Previously talked with Marigene Ehlers at [employer] regarding patients eligibility for their respite program.  Attempted to follow up with spouse via phone multiple times after this but did not receive return calls.  Also mailed letter with no response.  Spouse contacted clinic again on 06/21/19 and 06/22/19 regarding caregiver resources.  Left message for her on 06/22/19.  Spouse dropped off paperwork for [facility] [place] on 06/28/19 and requested assistance with getting patient in program.  Spouse was tearful during conversation today due to lack of service options for patient.  She inquired about placement for patient again.  Spouse has not discussed this with patient and says that she knows he will not be open to it.   Update 07/01/19:  Spouse was informed that there may be funding available to patient for cost of Adult [place] due to history of stroke.  Patient scheduled for clinic appointment on 07/05/19 for completion of medical information form  Case Manager Clinical Goal(s):   Marland Kitchen Over the next 90 days, patient will work with BSW to address needs related to  caregiver resources.   . Over the next 90 days, BSW will collaborate with RN Care Manager to address care management and care coordination needs  Interventions:  . Received call from patient's spouse requesting completion of Medical Information form for [facility] [place].   . Directed spouse to schedule clinic appointment for completion of form . Talked with Barkley Bruns from [facility] [place] regarding services.  She states that they have funding available to assist with cost of program for patients with history of stroke.  Unfortunately, this was not explained during last outreach to the center . Collaborated with RNCM, Kelli Churn, requesting assistance with provider completing form during office visit.    Patient Self Care Activities:  . Spouse/Caregiver provides 24/7 care for patient   Please see past updates related to this goal by clicking on the "Past Updates" button in the selected goal         Patient's spouse verbalizes understanding of instructions provided today.   Will send medical information form to [facility] [place] once completed by provider.     Ronn Melena, [place] Coordination Social Worker [place] [phone]

## 2019-07-02 NOTE — Progress Notes (Signed)
Internal Medicine Clinic Attending  CCM services provided by the care management provider and their documentation were discussed with Dr. Masoudi. We reviewed the pertinent findings, urgent action items addressed by the resident and non-urgent items to be addressed by the PCP.  I agree with the assessment, diagnosis, and plan of care documented in the CCM and resident's note.  Tahje Borawski [name] Sidi Dzikowski, MD 07/02/2019  

## 2019-07-02 NOTE — Progress Notes (Signed)
Internal Medicine Clinic Attending  CCM services provided by the care management provider and their documentation were discussed with Dr. Krienke. We reviewed the pertinent findings, urgent action items addressed by the resident and non-urgent items to be addressed by the PCP.  I agree with the assessment, diagnosis, and plan of care documented in the CCM and resident's note.  Zaydenn Balaguer C Kameron Blethen, DO 07/02/2019  

## 2019-07-02 NOTE — Progress Notes (Signed)
Internal Medicine Clinic Resident  I have personally reviewed this encounter including the documentation in this note and/or discussed this patient with the care management provider. I will address any urgent items identified by the care management provider and will communicate my actions to the patient's PCP. I have reviewed the patient's CCM visit with my supervising attending, Dr Vincent.  Kaiyah Eber, MD 07/02/2019    

## 2019-07-05 ENCOUNTER — Ambulatory Visit: Payer: PPO | Admitting: *Deleted

## 2019-07-05 ENCOUNTER — Ambulatory Visit (INDEPENDENT_AMBULATORY_CARE_PROVIDER_SITE_OTHER): Payer: PPO | Admitting: Internal Medicine

## 2019-07-05 ENCOUNTER — Other Ambulatory Visit: Payer: Self-pay

## 2019-07-05 VITALS — BP 145/70 | HR 105 | Temp 98.5°F | Wt 254.0 lb

## 2019-07-05 DIAGNOSIS — I6930 Unspecified sequelae of cerebral infarction: Secondary | ICD-10-CM | POA: Diagnosis not present

## 2019-07-05 DIAGNOSIS — F0280 Dementia in other diseases classified elsewhere without behavioral disturbance: Secondary | ICD-10-CM

## 2019-07-05 DIAGNOSIS — N182 Chronic kidney disease, stage 2 (mild): Secondary | ICD-10-CM

## 2019-07-05 DIAGNOSIS — E1169 Type 2 diabetes mellitus with other specified complication: Secondary | ICD-10-CM

## 2019-07-05 DIAGNOSIS — I10 Essential (primary) hypertension: Secondary | ICD-10-CM | POA: Diagnosis not present

## 2019-07-05 LAB — POCT GLYCOSYLATED HEMOGLOBIN (HGB A1C): Hemoglobin A1C: 6.6 % — AB (ref 4.0–5.6)

## 2019-07-05 LAB — GLUCOSE, CAPILLARY: Glucose-Capillary: 105 mg/dL — ABNORMAL HIGH (ref 70–99)

## 2019-07-05 NOTE — Addendum Note (Signed)
Addended by: Jose Persia on: 07/05/2019 04:14 PM   Modules accepted: Orders

## 2019-07-05 NOTE — Patient Instructions (Addendum)
Thank you for allowing [place] to provide your care today. Today we discussed your blood pressure and diabetes and also discussed your request for paper work to apply for day program for adult. We will fill the paper work and our Education officer, museum will work on the process. Please follow up with them.  Today we made no changes to your medications. Please take them as before. Your blood pressure was 145/70 today. Please take your medications and check your blood pressure at home and bring the log with you next time.  Schedule an appointment with PCP and for blood work   Should you have any questions or concerns please call the internal medicine clinic at [phone].

## 2019-07-05 NOTE — Progress Notes (Signed)
Internal Medicine Clinic Resident  Plan: Ordered urine microalbumin/creatinine ratio for next visit.   I have personally reviewed this encounter including the documentation in this note and/or discussed this patient with the care management provider. I will address any urgent items identified by the care management provider and will communicate my actions to the patient's PCP. I have reviewed the patient's CCM visit with my supervising attending, Dr Evette Doffing.  Jose Persia, MD 07/05/2019

## 2019-07-05 NOTE — Assessment & Plan Note (Addendum)
Patient is on Metformin 500 mg BID. Last HbA1c checked 5 months ago and was within goal at 6.9.  -Continue Metformin 500 mg BID -Checking HbA1c today  ADDENDUM: HbA1c 6.6. Continue current Tx.

## 2019-07-05 NOTE — Patient Instructions (Signed)
Visit Information It was nice meeting you and your wife today.  Goals Addressed              This Visit's Progress     Patient Stated   .  Per caregiver "I told the BSW I need help with his care." (pt-stated)        CARE PLAN ENTRY (see longitudinal plan of care for additional care plan information)   Current Barriers:   Chronic Disease Management support, education, and care coordination needs related to HTN, HLD, DMII, CKD Stage 2, Depression, and Dementia- met with patient and wife during clinic visit, wife states she is desperate to find a day program for her husband to provide socialization for him and for caregiver burn out for her, she does not feel she needs the assistance of this CCM RN for chronic disease management for patient   Case Manager Clinical Goal(s):   Over the next 90 days, patient will work with BSW to address needs related to Level of care concerns in patient with HTN, HLD, DMII, CKD Stage 2, Depression, and Dementia   Interventions:   Collaborated with BSW to initiate plan of care to address needs related to Level of care concerns in patient with HTN, HLD, DMII, CKD Stage 2, Depression, and Dementia  Explained role of CCM RN to wife and patient  Allowed wife to vent her frustrations regarding caregiver burnout and emotional support provided  For immediate caregiver relief, advised wife to see if the [employer], where patient spent most of his mornings with friends pre-pandemic, dining room is open and to take him there if that is an option  Gave provider the physical exam form and ask her to complete that is needed in enrolling in adult day program   Patient Self Care Activities:   Patient's wife  verbalizes understanding of plan to work with BSW to address level of care concerns  Attends all scheduled provider appointments  Patient's wife calls provider office for new concerns or questions   Please see past updates related to this goal by clicking  on the "Past Updates" button in the selected goal       .  Per Caregiver/Spouse:  "I am overwhelemed by taking care of him and want to have a life again" (pt-stated)        Current Barriers:  Marland Kitchen Knowledge Barriers related to caregiver resources and options for long term care.  Patient initially referred to [facility] services in April 2021 as spouse/caregiver contacted clinic regarding need for caregiver resources or possible placement for patient.  Had lengthy conversation with spouse about various day programs but informed her that private pay is only option for these services unless patient qualifies for Medicaid.  Spouse reports that patient does not qualify.  Spouse previously inquired about placement for patient but stated that patient is not going to be open to this.  Informed spouse that placement cannot be facilitated if patient is competent to make decision against it.  Previously talked with Marigene Ehlers at [employer] regarding patients eligibility for their respite program.  Attempted to follow up with spouse via phone multiple times after this but did not receive return calls.  Also mailed letter with no response.  Spouse contacted clinic again on 06/21/19 and 06/22/19 regarding caregiver resources.  Left message for her on 06/22/19.  Spouse dropped off paperwork for [facility] [place] on 06/28/19 and requested assistance with getting patient in program.  Spouse was  tearful during conversation today due to lack of service options for patient.  She inquired about placement for patient again.  Spouse has not discussed this with patient and says that she knows he will not be open to it.   Update 07/01/19:  Spouse was informed that there may be funding available to patient for cost of Adult [place] due to history of stroke.  Patient scheduled for clinic appointment on 07/05/19 for completion of medical information form  Case Manager Clinical Goal(s):  Marland Kitchen Over the next 90  days, patient will work with BSW to address needs related to  caregiver resources.   . Over the next 90 days, BSW will collaborate with RN Care Manager to address care management and care coordination needs  Interventions:  . Received call from patient's spouse requesting completion of Medical Information form for [facility] [place].   . Directed spouse to schedule clinic appointment for completion of form . Talked with Barkley Bruns from [facility] [place] regarding services.  She states that they have funding available to assist with cost of program for patients with history of stroke.  Unfortunately, this was not explained during last outreach to the center . Gave provider the patient's  physical exam form for her to complete and return to CCM team  Patient Self Care Activities:  . Spouse/Caregiver provides 24/7 care for patient   Please see past updates related to this goal by clicking on the "Past Updates" button in the selected goal         The patient verbalized understanding of instructions provided today and declined a print copy of patient instruction materials.   The care management team will reach out to the patient again over the next 30-60 days.   Kelli Churn RN, CCM, [facility] RN Care Manager [phone]

## 2019-07-05 NOTE — Chronic Care Management (AMB) (Signed)
Chronic Care Management   Follow Up Note   07/05/2019 Name: [name] [name] MRN: [id number] DOB: 23-Nov-1950  Referred by: [name] Contes, MD Reason for referral : Chronic Care Management (DM, HTN, CKD)   [name] [name] is a 69 y.o. year old male who is a primary care patient of [name] Contes, MD. The CCM team was consulted for assistance with chronic disease management and care coordination needs.    Review of patient status, including review of consultants reports, relevant laboratory and other test results, and collaboration with appropriate care team members and the patient's provider was performed as part of comprehensive patient evaluation and provision of chronic care management services.    SDOH (Social Determinants of Health) assessments performed: No See Care Plan activities for detailed interventions related to [facility])     Outpatient Encounter Medications as of 07/05/2019  Medication Sig   acetaminophen (TYLENOL) 325 MG tablet Take 2 tablets (650 mg total) by mouth every 8 (eight) hours as needed (prior to physical therapy or exercise).   amitriptyline (ELAVIL) 50 MG tablet Take 1 tablet (50 mg total) by mouth at bedtime.   amLODipine (NORVASC) 10 MG tablet Take 1 tablet (10 mg total) by mouth daily.   atorvastatin (LIPITOR) 40 MG tablet Take 1 tablet (40 mg total) by mouth daily.   Cholecalciferol (VITAMIN D) 50 MCG (2000 UT) tablet Take 1 tablet (2,000 Units total) by mouth daily.   dipyridamole-aspirin (AGGRENOX) 200-25 MG 12hr capsule Take 1 capsule by mouth 2 (two) times daily.   hydrochlorothiazide (MICROZIDE) 12.5 MG capsule Take 1 capsule (12.5 mg total) by mouth daily.   losartan (COZAAR) 100 MG tablet Take 1 tablet (100 mg total) by mouth daily.   metFORMIN (GLUCOPHAGE) 500 MG tablet Take 1 tablet (500 mg total) by mouth 2 (two) times daily with a meal.   omeprazole (PRILOSEC) 20 MG capsule Take 1 capsule by mouth twice a day before meals   risperiDONE  (RISPERDAL) 0.5 MG tablet Take 1 tablet (0.5 mg total) by mouth 2 (two) times daily.   No facility-administered encounter medications on file as of 07/05/2019.     Objective:  BP Readings from Last 3 Encounters:  07/05/19 (!) 145/70  02/23/19 (!) 106/58  02/13/19 (!) 154/93   Lab Results  Component Value Date   HGBA1C 6.6 (A) 07/05/2019   HGBA1C 6.9 (A) 02/09/2019   HGBA1C 6.3 (A) 01/20/2018   Lab Results  Component Value Date   MICROALBUR 10.68 (H) 08/19/2012   LDLCALC 117 (H) 02/09/2019   CREATININE 1.73 (H) 02/09/2019  update urine for protein please  Goals Addressed              This Visit's Progress     Patient Stated     Per caregiver "I told the BSW I need help with his care." (pt-stated)        CARE PLAN ENTRY (see longitudinal plan of care for additional care plan information)   Current Barriers:   Chronic Disease Management support, education, and care coordination needs related to HTN, HLD, DMII, CKD Stage 2, Depression, and Dementia- met with patient and wife during clinic visit, wife states she is desperate to find a day program for her husband to provide socialization for him and for caregiver burn out for her, she does not feel she needs the assistance of this CCM RN for chronic disease management for patient   Case Manager Clinical Goal(s):   Over the next 90 days, patient will  work with BSW to address needs related to Level of care concerns in patient with HTN, HLD, DMII, CKD Stage 2, Depression, and Dementia   Interventions:   Collaborated with BSW to initiate plan of care to address needs related to Level of care concerns in patient with HTN, HLD, DMII, CKD Stage 2, Depression, and Dementia  Explained role of CCM RN to wife and patient  Allowed wife to vent her frustrations regarding caregiver burnout and emotional support provided  For immediate caregiver relief, advised wife to see if the [employer], where patient spent most of his mornings  with friends pre-pandemic, dining room is open and to take him there if that is an option  Gave provider the physical exam form and ask her to complete that is needed in enrolling in adult day program   Patient Self Care Activities:   Patient's wife  verbalizes understanding of plan to work with BSW to address level of care concerns  Attends all scheduled provider appointments  Patient's wife calls provider office for new concerns or questions   Please see past updates related to this goal by clicking on the "Past Updates" button in the selected goal         Per Caregiver/Spouse:  "I am overwhelemed by taking care of him and want to have a life again" (pt-stated)        Current Barriers:   Knowledge Barriers related to caregiver resources and options for long term care.  Patient initially referred to [facility] services in April 2021 as spouse/caregiver contacted clinic regarding need for caregiver resources or possible placement for patient.  Had lengthy conversation with spouse about various day programs but informed her that private pay is only option for these services unless patient qualifies for Medicaid.  Spouse reports that patient does not qualify.  Spouse previously inquired about placement for patient but stated that patient is not going to be open to this.  Informed spouse that placement cannot be facilitated if patient is competent to make decision against it.  Previously talked with Marigene Ehlers at [employer] regarding patients eligibility for their respite program.  Attempted to follow up with spouse via phone multiple times after this but did not receive return calls.  Also mailed letter with no response.  Spouse contacted clinic again on 06/21/19 and 06/22/19 regarding caregiver resources.  Left message for her on 06/22/19.  Spouse dropped off paperwork for [facility] [place] on 06/28/19 and requested assistance with getting patient in program.  Spouse was tearful during  conversation today due to lack of service options for patient.  She inquired about placement for patient again.  Spouse has not discussed this with patient and says that she knows he will not be open to it.   Update 07/01/19:  Spouse was informed that there may be funding available to patient for cost of Adult [place] due to history of stroke.  Patient scheduled for clinic appointment on 07/05/19 for completion of medical information form  Case Manager Clinical Goal(s):   Over the next 90 days, patient will work with BSW to address needs related to  caregiver resources.    Over the next 90 days, BSW will collaborate with RN Care Manager to address care management and care coordination needs  Interventions:   Received call from patient's spouse requesting completion of Medical Information form for [facility] [place].    Directed spouse to schedule clinic appointment for  completion of form  Talked with Barkley Bruns from [facility] [place] regarding services.  She states that they have funding available to assist with cost of program for patients with history of stroke.  Unfortunately, this was not explained during last outreach to the center  Gave provider the patient's  physical exam form for her to complete and return to CCM team  Patient Self Care Activities:   Spouse/Caregiver provides 24/7 care for patient   Please see past updates related to this goal by clicking on the "Past Updates" button in the selected goal          Plan:   The care management team will reach out to the patient again over the next 30-60 days.   Kelli Churn RN, CCM, [facility] RN Care Manager [phone]

## 2019-07-05 NOTE — Assessment & Plan Note (Addendum)
BP today 145/70. He did not check BP at home recently because the machine does not work.  (Wife is going to try new batteries and if buy a new machine if still not functions well.  BP Readings from Last 3 Encounters:  07/05/19 (!) 145/70  02/23/19 (!) 106/58  02/13/19 (!) 154/93  Rechecked BP was: 145/70. Will continue current regimen. Will check BP next visit and escalate medications if needed.  -Continue Losartan 100 mg QD, HCTZ 12.5, Amlodipine 10 mg QD -Follow up with PCP -BMP next visit -F/u in clinic with PCP to recheck blood pressure -Check BP at home

## 2019-07-05 NOTE — Progress Notes (Signed)
   CC: Paper work for day program for adult  HPI:  [name] is a 69 y.o. male with PMHx as documented below, presented for routine check up and also requesting paper work to be done to apply for day program for adult. Please refer to problem based charting for further details and assessment and plan of current problem and chronic medical conditions.  Past Medical History:  Diagnosis Date  . ANEMIA, NORMOCYTIC, CHRONIC 11/17/2007   Qualifier: Diagnosis of  By: Cruzita Lederer MD, Salena Saner    . Colon polyp 06/12/12   Colonoscopy and polypectomy by Dr. Deatra Ina revealed tubular adenoma and tubulovillous adenoma  . Dementia of frontal lobe type ([place]) 12/17/2010  . Depression   . Diabetes mellitus   . GERD (gastroesophageal reflux disease)   . HYPERLIPIDEMIA 04/24/2006   Qualifier: Diagnosis of  By: Cruzita Lederer MD, Salena Saner    . Hypertension   . RENAL INSUFFICIENCY, CHRONIC 04/25/2006   Qualifier: Diagnosis of  By: Cruzita Lederer MD, Salena Saner    . Right hip pain 11/18/2016  . Stroke [facility])    3 strokes (2004, 2006, 2007)per wife,he had 3 strokes in a month!   Review of Systems:   Constitutional: Negative for chills and fever.  Respiratory: Negative for shortness of breath.   Cardiovascular: Negative for chest pain and leg swelling.  Gastrointestinal: Negative for abdominal pain, nausea and vomiting.  Neurological: Negative for dizziness and headaches, Positive left side weakness (chronic)  Physical Exam:  Vitals:   07/05/19 1028 07/05/19 1138  BP: (!) 161/89 (!) 145/70  Pulse: (!) 105   Temp: 98.5 F (36.9 C)   TempSrc: Oral   SpO2: 99%   Weight: 254 lb (115.2 kg)    Physical Exam Constitutional:      General: He is not in acute distress.    Appearance: Normal appearance. He is not ill-appearing.  HENT:     Head: Normocephalic and atraumatic.  Cardiovascular:     Rate and Rhythm: Normal rate and regular rhythm.     Heart sounds: No murmur heard.   Pulmonary:     Effort: Pulmonary  effort is normal.     Breath sounds: Normal breath sounds.  Abdominal:     General: There is no distension.     Palpations: Abdomen is soft.     Tenderness: There is no abdominal tenderness.  Musculoskeletal:        General: No swelling or deformity.     Cervical back: No tenderness.     Right lower leg: No edema.     Left lower leg: No edema.  Neurological:     Mental Status: He is alert. Mental status is at baseline.     Motor: Weakness present.     Comments: Left lower extremity with strength of 4/5     Assessment & Plan:   See Encounters Tab for problem based charting.  Patient discussed with Dr. Philipp Ovens

## 2019-07-05 NOTE — Assessment & Plan Note (Signed)
Patient with some left side residual deficit from prior stroke. Per his wife, he is able to feed him self but she needs to make sure the food is available for him. She also helps with taking him to bathroom and some other daily activities. Patient is able to walk with the walker. He has slow speech. Alert and oriented x 3 but wife reports that he sometimes has some mild memory issues. Wife mentions that she needs some break. His husband's family do not support him and expect her to do all the job. She mentions that she is not going to dump his husband at nursing home but needs some break. She has brought paper work to be filled to apply for day program for adult. CCM team is assisting with the paper work and process.  -Paper work filled for day program for adult.

## 2019-07-06 ENCOUNTER — Ambulatory Visit: Payer: Self-pay

## 2019-07-06 DIAGNOSIS — F0280 Dementia in other diseases classified elsewhere without behavioral disturbance: Secondary | ICD-10-CM

## 2019-07-06 DIAGNOSIS — N182 Chronic kidney disease, stage 2 (mild): Secondary | ICD-10-CM

## 2019-07-06 DIAGNOSIS — E1169 Type 2 diabetes mellitus with other specified complication: Secondary | ICD-10-CM

## 2019-07-06 NOTE — Patient Instructions (Signed)
Visit Information  Goals Addressed              This Visit's Progress   .  Per Caregiver/Spouse:  "I am overwhelemed by taking care of him and want to have a life again" (pt-stated)        Current Barriers:  Marland Kitchen Knowledge Barriers related to caregiver resources and options for long term care.  Patient initially referred to [facility] services in April 2021 as spouse/caregiver contacted clinic regarding need for caregiver resources or possible placement for patient.  Had lengthy conversation with spouse about various day programs but informed her that private pay is only option for these services unless patient qualifies for Medicaid.  Spouse reports that patient does not qualify.  Spouse previously inquired about placement for patient but stated that patient is not going to be open to this.  Informed spouse that placement cannot be facilitated if patient is competent to make decision against it.  Previously talked with Marigene Ehlers at [employer] [employer] regarding patients eligibility for their respite program.  Attempted to follow up with spouse via phone multiple times after this but did not receive return calls.  Also mailed letter with no response.  Spouse contacted clinic again on 06/21/19 and 06/22/19 regarding caregiver resources.  Left message for her on 06/22/19.  Spouse dropped off paperwork for Adult Day and [place] on 06/28/19 and requested assistance with getting patient in program.  Spouse was tearful during conversation today due to lack of service options for patient.  She inquired about placement for patient again.  Spouse has not discussed this with patient and says that she knows he will not be open to it.   Update 07/01/19:  Spouse was informed that there may be funding available to patient for cost of Adult [place] due to history of stroke.  Patient scheduled for clinic appointment on 07/05/19 for completion of medical information form  Case Manager Clinical Goal(s):   Marland Kitchen Over the next 90 days, patient will work with BSW to address needs related to  caregiver resources.   . Over the next 90 days, BSW will collaborate with RN Care Manager to address care management and care coordination needs  Interventions:  . In basket message sent to Dr. Myrtie Hawk requesting completion of Medical Information form for Adult Day and [facility].  Page two of document  missing information and physician signature.   Virgel Gess completed Medical Information form to Barkley Bruns with Adult Day and [place]  Patient Self Care Activities:  . Spouse/Caregiver provides 24/7 care for patient   Please see past updates related to this goal by clicking on the "Past Updates" button in the selected goal           The patient's caregiver/spouse has been provided with contact information for the care management team and has been advised to call with any health related questions or concerns.     [name] [name], [place] Coordination Social Worker [place] [phone]

## 2019-07-06 NOTE — Progress Notes (Signed)
Internal Medicine Clinic Attending  Case discussed with Dr. Masoudi  at the time of the visit.  We reviewed the resident's history and exam and pertinent patient test results.  I agree with the assessment, diagnosis, and plan of care documented in the resident's note.  

## 2019-07-06 NOTE — Chronic Care Management (AMB) (Signed)
Care Management   Follow Up Note   07/06/2019 Name: [name] [name] MRN: [id number] DOB: 07-Dec-1950  Referred by: Aldine Contes, MD Reason for referral : Care Coordination (Caregiver Resorces)   [name] [name] is a 69 y.o. year old male who is a primary care patient of Aldine Contes, MD. The care management team was consulted for assistance with care management and care coordination needs.    Review of patient status, including review of consultants reports, relevant laboratory and other test results, and collaboration with appropriate care team members and the patient's provider was performed as part of comprehensive patient evaluation and provision of chronic care management services.    SDOH (Social Determinants of Health) assessments performed: No See Care Plan activities for detailed interventions related to [facility])     Advanced Directives: See Care Plan and Vynca application for related entries.   Goals Addressed              This Visit's Progress   .  Per Caregiver/Spouse:  "I am overwhelemed by taking care of him and want to have a life again" (pt-stated)        Current Barriers:  Marland Kitchen Knowledge Barriers related to caregiver resources and options for long term care.  Patient initially referred to [facility] services in April 2021 as spouse/caregiver contacted clinic regarding need for caregiver resources or possible placement for patient.  Had lengthy conversation with spouse about various day programs but informed her that private pay is only option for these services unless patient qualifies for Medicaid.  Spouse reports that patient does not qualify.  Spouse previously inquired about placement for patient but stated that patient is not going to be open to this.  Informed spouse that placement cannot be facilitated if patient is competent to make decision against it.  Previously talked with Marigene Ehlers at [facility] [employer] regarding patients eligibility for their respite program.   Attempted to follow up with spouse via phone multiple times after this but did not receive return calls.  Also mailed letter with no response.  Spouse contacted clinic again on 06/21/19 and 06/22/19 regarding caregiver resources.  Left message for her on 06/22/19.  Spouse dropped off paperwork for Adult Day and [place] on 06/28/19 and requested assistance with getting patient in program.  Spouse was tearful during conversation today due to lack of service options for patient.  She inquired about placement for patient again.  Spouse has not discussed this with patient and says that she knows he will not be open to it.   Update 07/01/19:  Spouse was informed that there may be funding available to patient for cost of Adult [place] due to history of stroke.  Patient scheduled for clinic appointment on 07/05/19 for completion of medical information form  Case Manager Clinical Goal(s):  Marland Kitchen Over the next 90 days, patient will work with BSW to address needs related to  caregiver resources.   . Over the next 90 days, BSW will collaborate with RN Care Manager to address care management and care coordination needs  Interventions:  . In basket message sent to Dr. Myrtie Hawk requesting completion of Medical Information form for Adult Day and [place].  Page two of document  missing information and physician signature.   Virgel Gess completed Medical Information form to Barkley Bruns with Adult Day and [place]  Patient Self Care Activities:  . Spouse/Caregiver provides 24/7 care for patient   Please see  past updates related to this goal by clicking on the "Past Updates" button in the selected goal          The patient's caregiver/spouse has been provided with contact information for the care management team and has been advised to call with any health related questions or concerns.     Ronn Melena, [place] Coordination Social Worker [place] [phone]

## 2019-07-06 NOTE — Progress Notes (Signed)
Internal Medicine Clinic Resident  I have personally reviewed this encounter including the documentation in this note and/or discussed this patient with the care management provider. I will address any urgent items identified by the care management provider and will communicate my actions to the patient's PCP. I have reviewed the patient's CCM visit with my supervising attending, Dr Guilloud.  Treyvon Blahut, MD 07/06/2019    

## 2019-07-06 NOTE — Progress Notes (Signed)
Internal Medicine Clinic Attending  CCM services provided by the care management provider and their documentation were discussed with Dr. Basaraba. We reviewed the pertinent findings, urgent action items addressed by the resident and non-urgent items to be addressed by the PCP.  I agree with the assessment, diagnosis, and plan of care documented in the CCM and resident's note.  Quetzally Callas [name] Resa Rinks, MD 07/06/2019  

## 2019-07-09 NOTE — Progress Notes (Signed)
Internal Medicine Clinic Attending  CCM services provided by the care management provider and their documentation were discussed with Dr. Basaraba. We reviewed the pertinent findings, urgent action items addressed by the resident and non-urgent items to be addressed by the PCP.  I agree with the assessment, diagnosis, and plan of care documented in the CCM and resident's note.  Bain Whichard, MD 07/09/2019 

## 2019-07-13 ENCOUNTER — Ambulatory Visit: Payer: PPO

## 2019-07-13 DIAGNOSIS — I10 Essential (primary) hypertension: Secondary | ICD-10-CM

## 2019-07-13 DIAGNOSIS — F0280 Dementia in other diseases classified elsewhere without behavioral disturbance: Secondary | ICD-10-CM

## 2019-07-13 DIAGNOSIS — E1169 Type 2 diabetes mellitus with other specified complication: Secondary | ICD-10-CM

## 2019-07-13 NOTE — Progress Notes (Signed)
Internal Medicine Clinic Resident  I have personally reviewed this encounter including the documentation in this note and/or discussed this patient with the care management provider. I will address any urgent items identified by the care management provider and will communicate my actions to the patient's PCP. I have reviewed the patient's CCM visit with my supervising attending, Dr Guilloud.  Keeli Roberg M Labria Wos, MD 07/13/2019    

## 2019-07-13 NOTE — Patient Instructions (Signed)
Visit Information  Goals Addressed              This Visit's Progress   .  Per Caregiver/Spouse:  "I am overwhelemed by taking care of him and want to have a life again" (pt-stated)        Current Barriers:  Marland Kitchen Knowledge Barriers related to caregiver resources and options for long term care.  Patient initially referred to [facility] services in April 2021 as spouse/caregiver contacted clinic regarding need for caregiver resources or possible placement for patient.  Had lengthy conversation with spouse about various day programs but informed her that private pay is only option for these services unless patient qualifies for Medicaid.  Spouse reports that patient does not qualify.  Spouse previously inquired about placement for patient but stated that patient is not going to be open to this.  Informed spouse that placement cannot be facilitated if patient is competent to make decision against it.  Previously talked with Marigene Ehlers at [employer] [employer] regarding patients eligibility for their respite program.  Attempted to follow up with spouse via phone multiple times after this but did not receive return calls.  Also mailed letter with no response.  Spouse contacted clinic again on 06/21/19 and 06/22/19 regarding caregiver resources.  Left message for her on 06/22/19.  Spouse dropped off paperwork for Adult Day and [place] on 06/28/19 and requested assistance with getting patient in program.  Spouse was tearful during conversation today due to lack of service options for patient.  She inquired about placement for patient again.  Spouse has not discussed this with patient and says that she knows he will not be open to it.   Update 07/01/19:  Spouse was informed that there may be funding available to patient for cost of Adult [place] due to history of stroke.  Patient scheduled for clinic appointment on 07/05/19 for completion of medical information form  Case Manager Clinical Goal(s):   Marland Kitchen Over the next 90 days, patient will work with BSW to address needs related to  caregiver resources.   . Over the next 90 days, BSW will collaborate with RN Care Manager to address care management and care coordination needs  Interventions:  . Collaborated with  Barkley Bruns with Adult Day and [place].  She confirmed receipt of Medical Information form that was faxed last week and stated that patient/caregiver will be contacted . Provided her with my contact information and requested that she call if additional information is needed or if she has difficulty contacting patient and/or caregiver.   Patient Self Care Activities:  . Spouse/Caregiver provides 24/7 care for patient   Please see past updates related to this goal by clicking on the "Past Updates" button in the selected goal           Will attempt to reach spouse/caregiver again next week if no response to voicemail message left today.       Ronn Melena, [place] Coordination Social Worker [place] [phone]

## 2019-07-13 NOTE — Chronic Care Management (AMB) (Signed)
Care Management   Follow Up Note   07/13/2019 Name: [name] [name] MRN: [id number] DOB: 16-Jun-1950  Referred by: Aldine Contes, MD Reason for referral : Care Coordination (Caregiver Resources)   [name] [name] is a 69 y.o. year old male who is a primary care patient of Aldine Contes, MD. The care management team was consulted for assistance with care management and care coordination needs.    Review of patient status, including review of consultants reports, relevant laboratory and other test results, and collaboration with appropriate care team members and the patient's provider was performed as part of comprehensive patient evaluation and provision of chronic care management services.    SDOH (Social Determinants of Health) assessments performed: No See Care Plan activities for detailed interventions related to [facility])     Advanced Directives: See Care Plan and Vynca application for related entries.   Goals Addressed              This Visit's Progress   .  Per Caregiver/Spouse:  "I am overwhelemed by taking care of him and want to have a life again" (pt-stated)        Current Barriers:  Marland Kitchen Knowledge Barriers related to caregiver resources and options for long term care.  Patient initially referred to [facility] services in April 2021 as spouse/caregiver contacted clinic regarding need for caregiver resources or possible placement for patient.  Had lengthy conversation with spouse about various day programs but informed her that private pay is only option for these services unless patient qualifies for Medicaid.  Spouse reports that patient does not qualify.  Spouse previously inquired about placement for patient but stated that patient is not going to be open to this.  Informed spouse that placement cannot be facilitated if patient is competent to make decision against it.  Previously talked with Marigene Ehlers at [employer] regarding patients eligibility for their respite program.   Attempted to follow up with spouse via phone multiple times after this but did not receive return calls.  Also mailed letter with no response.  Spouse contacted clinic again on 06/21/19 and 06/22/19 regarding caregiver resources.  Left message for her on 06/22/19.  Spouse dropped off paperwork for Adult Day and [place] on 06/28/19 and requested assistance with getting patient in program.  Spouse was tearful during conversation today due to lack of service options for patient.  She inquired about placement for patient again.  Spouse has not discussed this with patient and says that she knows he will not be open to it.   Update 07/01/19:  Spouse was informed that there may be funding available to patient for cost of Adult [place] due to history of stroke.  Patient scheduled for clinic appointment on 07/05/19 for completion of medical information form  Case Manager Clinical Goal(s):  Marland Kitchen Over the next 90 days, patient will work with BSW to address needs related to  caregiver resources.   . Over the next 90 days, BSW will collaborate with RN Care Manager to address care management and care coordination needs  Interventions:  . Collaborated with  Barkley Bruns with Adult Day and [place].  She confirmed receipt of Medical Information form that was faxed last week and stated that patient/caregiver will be contacted . Provided her with my contact information and requested that she call if additional information is needed or if she has difficulty contacting patient and/or caregiver.   Patient Self Care Activities:  .  Spouse/Caregiver provides 24/7 care for patient   Please see past updates related to this goal by clicking on the "Past Updates" button in the selected goal          Attempted to follow up with spouse/caregiver today but had to leave message. Will attempt to reach again next week if no return call.      Ronn Melena, [place] Coordination Social  Worker [place] [phone]

## 2019-07-14 NOTE — Progress Notes (Signed)
Internal Medicine Clinic Attending  CCM services provided by the care management provider and their documentation were discussed with Dr. Krienke. We reviewed the pertinent findings, urgent action items addressed by the resident and non-urgent items to be addressed by the PCP.  I agree with the assessment, diagnosis, and plan of care documented in the CCM and resident's note.  Shandrika Ambers, MD 07/14/2019 

## 2019-07-23 ENCOUNTER — Telehealth: Payer: PPO

## 2019-07-26 ENCOUNTER — Telehealth: Payer: Self-pay | Admitting: Internal Medicine

## 2019-07-26 NOTE — Chronic Care Management (AMB) (Signed)
  Chronic Care Management   Note  07/26/2019 Name: [name] [name] MRN: [id number] DOB: 02/12/1950  [name] is a 69 y.o. year old male who is a primary care patient of Aldine Contes, MD and is actively engaged with the care management team. I reached out to [facility] by phone today to assist with re-scheduling a follow up visit with the BSW.  Follow up plan: Unsuccessful telephone outreach attempt made. A HIPPA compliant phone message was left for the patient providing contact information and requesting a return call. The care management team will reach out to the patient again over the next 7 days. If patient returns call to provider office, please advise to call [place] at [phone].  [place], [place] [address] Direct Dial: [phone] [email or web address] Website: [email or web address]

## 2019-07-27 ENCOUNTER — Ambulatory Visit: Payer: PPO

## 2019-07-27 ENCOUNTER — Ambulatory Visit: Payer: PPO | Admitting: Internal Medicine

## 2019-07-27 ENCOUNTER — Other Ambulatory Visit: Payer: Self-pay

## 2019-07-27 ENCOUNTER — Encounter: Payer: Self-pay | Admitting: Internal Medicine

## 2019-07-27 VITALS — BP 139/74 | HR 100 | Temp 97.5°F | Ht 65.0 in | Wt 254.5 lb

## 2019-07-27 DIAGNOSIS — I10 Essential (primary) hypertension: Secondary | ICD-10-CM

## 2019-07-27 DIAGNOSIS — E559 Vitamin D deficiency, unspecified: Secondary | ICD-10-CM

## 2019-07-27 DIAGNOSIS — E1169 Type 2 diabetes mellitus with other specified complication: Secondary | ICD-10-CM

## 2019-07-27 DIAGNOSIS — N182 Chronic kidney disease, stage 2 (mild): Secondary | ICD-10-CM

## 2019-07-27 DIAGNOSIS — D125 Benign neoplasm of sigmoid colon: Secondary | ICD-10-CM

## 2019-07-27 DIAGNOSIS — F32A Depression, unspecified: Secondary | ICD-10-CM

## 2019-07-27 MED ORDER — VITAMIN D 50 MCG (2000 UT) PO TABS: 2000.0000 [IU] | ORAL_TABLET | Freq: Every day | ORAL | 1 refills | Status: AC

## 2019-07-27 NOTE — Patient Instructions (Signed)
-  It was a pleasure seeing you today -We will check some blood work on you today including your kidney function and vitamin D levels -I have put in a referral to gastroenterology for repeat colonoscopy -I have refilled your vitamin D -We will also give you a number to call from care management.  They attempted to contact yesterday and were unsuccessful. - please call me if you have any questions or concerns

## 2019-07-27 NOTE — Assessment & Plan Note (Signed)
BP Readings from Last 3 Encounters:  07/27/19 139/74  07/05/19 (!) 145/70  02/23/19 (!) 106/58    Lab Results  Component Value Date   NA 144 02/09/2019   K 4.0 02/09/2019   CREATININE 1.73 (H) 02/09/2019    Assessment: Blood pressure control:  Well-controlled Progress toward BP goal:   At goal Comments: Patient is compliant with losartan 100 mg daily, HCTZ 12.5 mg daily and amlodipine 10 mg daily  Plan: Medications:  continue current medications Educational resources provided:   Self management tools provided:   Other plans: We will check BMP today

## 2019-07-27 NOTE — Assessment & Plan Note (Signed)
-  This problem is chronic and stable -Patient denies any worsening of his depression symptoms but his PHQ-9 score is elevated to 18 today -We will continue with risperidone and Elavil -We will discuss with patient and wife about possible referral to [facility] for counseling -No further work-up at this time

## 2019-07-27 NOTE — Progress Notes (Signed)
   Subjective:    Patient ID: [name] [name], male    DOB: Jul 01, 1950, 70 y.o.   MRN: [id number]  HPI  I have seen and examined this patient.  Patient is here for routine follow-up of his hypertension and diabetes.  Patient states that he feels well today and denies any new complaints.  Patient states that he is compliant with his medications.  Patient's wife is with him today and is working with chronic care management to get additional resources and respite care.   Review of Systems  Constitutional: Negative.   HENT: Negative.   Respiratory: Negative.   Cardiovascular: Negative.   Gastrointestinal: Negative.   Musculoskeletal: Negative.   Neurological: Negative.   Psychiatric/Behavioral: Negative.        Objective:   Physical Exam Constitutional:      Appearance: Normal appearance.  HENT:     Head: Normocephalic and atraumatic.  Cardiovascular:     Rate and Rhythm: Normal rate and regular rhythm.     Heart sounds: No murmur heard.   Pulmonary:     Effort: Pulmonary effort is normal.     Breath sounds: Wheezing present.     Comments: Scattered bilateral wheezes noted on exam Abdominal:     General: Bowel sounds are normal. There is no distension.     Palpations: Abdomen is soft.     Tenderness: There is no abdominal tenderness.  Musculoskeletal:        General: No swelling or tenderness.     Cervical back: Neck supple.  Lymphadenopathy:     Cervical: No cervical adenopathy.  Neurological:     General: No focal deficit present.     Mental Status: He is alert and oriented to person, place, and time.  Psychiatric:        Mood and Affect: Mood normal.        Behavior: Behavior normal.           Assessment & Plan:  Please see problem based charting for assessment and plan:

## 2019-07-27 NOTE — Assessment & Plan Note (Signed)
-  Patient had a colonoscopy in December 2019 but had inadequate prep -At that time his gastroenterologist wanted him to follow-up within 6 months for repeat colonoscopy but he was unable to do so secondary to the Covid pandemic -I have put in referral to GI again today.  Patient will need to follow-up with GI for repeat colonoscopy -Patient and wife expressed understanding. -No further work-up at this time

## 2019-07-27 NOTE — Assessment & Plan Note (Addendum)
-  This problem is chronic and stable -Patient has baseline creatinine approximately 1.4-1.6 -We will follow-up repeat BMP today -Patient denies any urinary complaints at this time -No further work-up for now

## 2019-07-27 NOTE — Assessment & Plan Note (Signed)
-  Patient was noted to have extremely low vitamin D level on previous blood work -I have prescribed vitamin D supplementation for the patient but patient and wife state that they had run out of this and it has not been refilled -I have refilled his medication for them today -We will follow-up vitamin D level.  With vitamin D level is still very low would consider starting him on vitamin D 50,000 units q. weekly for 4 to 6 weeks and then transitioning him to a lower dose -No further work-up at this time

## 2019-07-27 NOTE — Assessment & Plan Note (Addendum)
Lab Results  Component Value Date   HGBA1C 6.6 (A) 07/05/2019   HGBA1C 6.9 (A) 02/09/2019   HGBA1C 6.3 (A) 01/20/2018     Assessment: Diabetes control:  Well-controlled Progress toward A1C goal:   At goal Comments: Patient is compliant with Metformin 500 mg twice daily  Plan: Medications:  continue current medications Home glucose monitoring: Frequency:   Timing:   Instruction/counseling given: no instruction/counseling  Educational resources provided:   Self management tools provided:   Other plans: We will check BMP, foot exam done today

## 2019-07-28 ENCOUNTER — Telehealth: Payer: PPO

## 2019-07-28 ENCOUNTER — Ambulatory Visit (INDEPENDENT_AMBULATORY_CARE_PROVIDER_SITE_OTHER): Payer: PPO | Admitting: Podiatry

## 2019-07-28 ENCOUNTER — Encounter: Payer: Self-pay | Admitting: Podiatry

## 2019-07-28 DIAGNOSIS — E1169 Type 2 diabetes mellitus with other specified complication: Secondary | ICD-10-CM

## 2019-07-28 DIAGNOSIS — M2012 Hallux valgus (acquired), left foot: Secondary | ICD-10-CM | POA: Diagnosis not present

## 2019-07-28 DIAGNOSIS — M2011 Hallux valgus (acquired), right foot: Secondary | ICD-10-CM

## 2019-07-28 DIAGNOSIS — M79674 Pain in right toe(s): Secondary | ICD-10-CM

## 2019-07-28 DIAGNOSIS — B351 Tinea unguium: Secondary | ICD-10-CM

## 2019-07-28 DIAGNOSIS — M79675 Pain in left toe(s): Secondary | ICD-10-CM | POA: Diagnosis not present

## 2019-07-28 NOTE — Progress Notes (Signed)
This patient returns to my office for at risk foot care.  This patient requires this care by a professional since this patient will be at risk due to having chronic kidney disease and diabetes.  Patient presents to the office with male caregiver in a wheelchair.  This patient is unable to cut nails himself since the patient cannot reach his nails.These nails are painful walking and wearing shoes.  This patient presents for at risk foot care today.  General Appearance  Alert, conversant and in no acute stress.  Vascular  Dorsalis pedis and posterior tibial  pulses are palpable  bilaterally.  Capillary return is within normal limits  bilaterally. Temperature is within normal limits  bilaterally.  Neurologic  Senn-Weinstein monofilament wire test diminished   bilaterally. Muscle power within normal limits bilaterally.  Nails Thick disfigured discolored nails with subungual debris  from hallux to fifth toes bilaterally. No evidence of bacterial infection or drainage bilaterally.  Orthopedic  No limitations of motion  feet .  No crepitus or effusions noted.  No bony pathology or digital deformities noted. HAV  B/L.  Skin  normotropic skin with no porokeratosis noted bilaterally.  No signs of infections or ulcers noted.     Onychomycosis  Pain in right toes  Pain in left toes  Consent was obtained for treatment procedures.   Mechanical debridement of nails 1-5  bilaterally performed with a nail nipper.  Filed with dremel without incident.    Return office visit  4 months                    Told patient to return for periodic foot care and evaluation due to potential at risk complications.   Gardiner Barefoot DPM

## 2019-07-28 NOTE — Chronic Care Management (AMB) (Signed)
  Chronic Care Management   Note  07/28/2019 Name: [name] [name] MRN: [id number] DOB: [birth year]  [name] is a 69 y.o. year old male who is a primary care patient of Aldine Contes, MD and is actively engaged with the care management team. I reached out to [facility] by phone today to assist with re-scheduling a follow up visit with the BSW.  Follow up plan: Telephone appointment with care management team member scheduled for:08/09/2019.  [place],  [id number] Direct Dial: [phone] [email or web address]: [email or web address]

## 2019-08-02 ENCOUNTER — Other Ambulatory Visit: Payer: PPO

## 2019-08-03 ENCOUNTER — Other Ambulatory Visit: Payer: Self-pay | Admitting: Internal Medicine

## 2019-08-03 DIAGNOSIS — E1169 Type 2 diabetes mellitus with other specified complication: Secondary | ICD-10-CM

## 2019-08-04 ENCOUNTER — Encounter: Payer: Self-pay | Admitting: *Deleted

## 2019-08-05 ENCOUNTER — Telehealth: Payer: PPO

## 2019-08-05 ENCOUNTER — Other Ambulatory Visit: Payer: PPO

## 2019-08-05 DIAGNOSIS — E559 Vitamin D deficiency, unspecified: Secondary | ICD-10-CM | POA: Diagnosis not present

## 2019-08-05 DIAGNOSIS — N182 Chronic kidney disease, stage 2 (mild): Secondary | ICD-10-CM

## 2019-08-06 ENCOUNTER — Telehealth: Payer: Self-pay | Admitting: Internal Medicine

## 2019-08-06 LAB — BMP8+ANION GAP
Anion Gap: 17 mmol/L (ref 10.0–18.0)
BUN/Creatinine Ratio: 7 — ABNORMAL LOW (ref 10–24)
BUN: 11 mg/dL (ref 8–27)
CO2: 23 mmol/L (ref 20–29)
Calcium: 9.1 mg/dL (ref 8.6–10.2)
Chloride: 102 mmol/L (ref 96–106)
Creatinine, Ser: 1.52 mg/dL — ABNORMAL HIGH (ref 0.76–1.27)
GFR calc Af Amer: 54 mL/min/{1.73_m2} — ABNORMAL LOW (ref >59)
GFR calc non Af Amer: 46 mL/min/{1.73_m2} — ABNORMAL LOW (ref >59)
Glucose: 180 mg/dL — ABNORMAL HIGH (ref 65–99)
Potassium: 3.6 mmol/L (ref 3.5–5.2)
Sodium: 142 mmol/L (ref 134–144)

## 2019-08-06 LAB — VITAMIN D 25 HYDROXY (VIT D DEFICIENCY, FRACTURES): Vit D, 25-Hydroxy: 7.1 ng/mL — ABNORMAL LOW (ref 30.0–100.0)

## 2019-08-06 MED ORDER — VITAMIN D (ERGOCALCIFEROL) 1.25 MG (50000 UNIT) PO CAPS: 50000.0000 [IU] | ORAL_CAPSULE | ORAL | 0 refills | Status: AC

## 2019-08-06 NOTE — Addendum Note (Signed)
Addended by: Aldine Contes on: 08/06/2019 10:49 AM   Modules accepted: Orders

## 2019-08-06 NOTE — Telephone Encounter (Signed)
I called the patient to discuss the results of his blood work with him.  I spoke with the patient's wife who is his primary caregiver.  Explained to the patient's wife that the patient's creatinine is at his baseline and that his vitamin D level remains very low.  I explained to the wife that we need to start the patient on high-dose vitamin D 50,000 units once a week for 8 weeks.  We will then transition him back to the lower dose of vitamin D.  Wife wants a prescription called into [facility] her cardiologist.  I have put this prescription in for him.  No further work-up at this time.  We will recheck his vitamin D at his next visit.  Wife expresses understanding and is in agreement with plan.

## 2019-08-09 ENCOUNTER — Ambulatory Visit: Payer: PPO

## 2019-08-09 DIAGNOSIS — E1169 Type 2 diabetes mellitus with other specified complication: Secondary | ICD-10-CM

## 2019-08-09 DIAGNOSIS — N182 Chronic kidney disease, stage 2 (mild): Secondary | ICD-10-CM

## 2019-08-09 DIAGNOSIS — I10 Essential (primary) hypertension: Secondary | ICD-10-CM

## 2019-08-09 NOTE — Progress Notes (Signed)
Internal Medicine Clinic Resident  I have personally reviewed this encounter including the documentation in this note and/or discussed this patient with the care management provider. I will address any urgent items identified by the care management provider and will communicate my actions to the patient's PCP. I have reviewed the patient's CCM visit with my supervising attending, Dr Hoffman.  Mikah Rottinghaus K Zakaria Fromer, MD 08/09/2019   

## 2019-08-09 NOTE — Patient Instructions (Signed)
Visit Information  Goals Addressed              This Visit's Progress   .  Per Caregiver/Spouse:  "I am overwhelemed by taking care of him and want to have a life again" (pt-stated)        Current Barriers:  Marland Kitchen Knowledge Barriers related to caregiver resources and options for long term care.  Patient initially referred to [facility] services in April 2021 as spouse/caregiver contacted clinic regarding need for caregiver resources or possible placement for patient.  Had lengthy conversation with spouse about various day programs but informed her that private pay is only option for these services unless patient qualifies for Medicaid.  Spouse reports that patient does not qualify.  Spouse previously inquired about placement for patient but stated that patient is not going to be open to this.  Informed spouse that placement cannot be facilitated if patient is competent to make decision against it.  Previously talked with Marigene Ehlers at [facility] regarding patients eligibility for their respite program.  Attempted to follow up with spouse via phone multiple times after this but did not receive return calls.  Also mailed letter with no response.  Spouse contacted clinic again on 06/21/19 and 06/22/19 regarding caregiver resources.  Left message for her on 06/22/19.  Spouse dropped off paperwork for [facility] [place] on 06/28/19 and requested assistance with getting patient in program.  Spouse was tearful during conversation today due to lack of service options for patient.  She inquired about placement for patient again.  Spouse has not discussed this with patient and says that she knows he will not be open to it.   Update 07/01/19:  Spouse was informed that there may be funding available to patient for cost of [facility] [place] due to history of stroke.  Patient scheduled for clinic appointment on 07/05/19 for completion of medical information form  Case Manager Clinical Goal(s):   Marland Kitchen Over the next 90 days, patient will work with BSW to address needs related to  caregiver resources.   . Over the next 90 days, BSW will collaborate with RN Care Manager to address care management and care coordination needs  Interventions:  . Contacted spouse regarding status of services with [facility] [place]; stated they are waiting on paperwork . Collaborated with  Barkley Bruns with the program to determine if additional documentation is needed; all documentation has been submitted to the state and they are awaiting approval . Called spouse back to inform her of this.    Patient Self Care Activities:  . Spouse/Caregiver provides 24/7 care for patient   Please see past updates related to this goal by clicking on the "Past Updates" button in the selected goal         Patient verbalizes understanding of instructions provided today.   Telephone follow up appointment with care management team member scheduled for:09/07/2019 @ 10:00 AM      [place], [place] Coordination Social Worker [place] [phone]

## 2019-08-09 NOTE — Chronic Care Management (AMB) (Signed)
Care Management   Follow Up Note   08/09/2019 Name: [name] [name] MRN: [id number] DOB: 1950-01-27  Referred by: Aldine Contes, MD Reason for referral : No chief complaint on file.   [name] [name] is a 69 y.o. year old male who is a primary care patient of Aldine Contes, MD. The care management team was consulted for assistance with care management and care coordination needs.    Review of patient status, including review of consultants reports, relevant laboratory and other test results, and collaboration with appropriate care team members and the patient's provider was performed as part of comprehensive patient evaluation and provision of chronic care management services.    SDOH (Social Determinants of Health) assessments performed: No See Care Plan activities for detailed interventions related to [facility])     Advanced Directives: See Care Plan and Vynca application for related entries.   Goals Addressed              This Visit's Progress   .  Per Caregiver/Spouse:  "I am overwhelemed by taking care of him and want to have a life again" (pt-stated)        Current Barriers:  Marland Kitchen Knowledge Barriers related to caregiver resources and options for long term care.  Patient initially referred to [facility] services in April 2021 as spouse/caregiver contacted clinic regarding need for caregiver resources or possible placement for patient.  Had lengthy conversation with spouse about various day programs but informed her that private pay is only option for these services unless patient qualifies for Medicaid.  Spouse reports that patient does not qualify.  Spouse previously inquired about placement for patient but stated that patient is not going to be open to this.  Informed spouse that placement cannot be facilitated if patient is competent to make decision against it.  Previously talked with Marigene Ehlers at [employer] regarding patients eligibility for their respite program.  Attempted to  follow up with spouse via phone multiple times after this but did not receive return calls.  Also mailed letter with no response.  Spouse contacted clinic again on 06/21/19 and 06/22/19 regarding caregiver resources.  Left message for her on 06/22/19.  Spouse dropped off paperwork for [facility] [place] on 06/28/19 and requested assistance with getting patient in program.  Spouse was tearful during conversation today due to lack of service options for patient.  She inquired about placement for patient again.  Spouse has not discussed this with patient and says that she knows he will not be open to it.   Update 07/01/19:  Spouse was informed that there may be funding available to patient for cost of [facility] [place] due to history of stroke.  Patient scheduled for clinic appointment on 07/05/19 for completion of medical information form  Case Manager Clinical Goal(s):  Marland Kitchen Over the next 90 days, patient will work with BSW to address needs related to  caregiver resources.   . Over the next 90 days, BSW will collaborate with RN Care Manager to address care management and care coordination needs  Interventions:  . Contacted spouse regarding status of services with [facility] [place]; stated they are waiting on paperwork . Collaborated with  Barkley Bruns with the program to determine if additional documentation is needed; all documentation has been submitted to the state and they are awaiting approval . Called spouse back to inform her of this.    Patient Self Care Activities:  .  Spouse/Caregiver provides 24/7 care for patient   Please see past updates related to this goal by clicking on the "Past Updates" button in the selected goal          Telephone follow up appointment with care management team member scheduled for:08/26/2019 @ 10:00AM      [place], [place] Coordination Social Worker [place] [phone]

## 2019-08-19 NOTE — Progress Notes (Signed)
Order(s) created erroneously. Erroneous order ID: [id number]  Order moved by: Milas Hock  Order move date/time: 08/19/2019 8:40 AM  Source Patient: [id number]  Source Contact: 03/08/2019  Destination Patient: [id number]  Destination Contact: 03/31/2012

## 2019-08-23 ENCOUNTER — Ambulatory Visit: Payer: Self-pay

## 2019-08-23 ENCOUNTER — Telehealth: Payer: PPO

## 2019-08-23 DIAGNOSIS — R4020 Unspecified coma: Secondary | ICD-10-CM | POA: Diagnosis not present

## 2019-08-23 DIAGNOSIS — R404 Transient alteration of awareness: Secondary | ICD-10-CM | POA: Diagnosis not present

## 2019-08-23 DIAGNOSIS — R0689 Other abnormalities of breathing: Secondary | ICD-10-CM | POA: Diagnosis not present

## 2019-08-23 DIAGNOSIS — I499 Cardiac arrhythmia, unspecified: Secondary | ICD-10-CM | POA: Diagnosis not present

## 2019-08-23 NOTE — Chronic Care Management (AMB) (Signed)
  Care Management   Follow Up Note   08/24/2019 Name: [name] [name] MRN: [id number] DOB: 1950/12/18  Patient deceased, SW to close patient to embedded program  Goals Addressed              This Visit's Progress   .  COMPLETED: Per caregiver "I told the BSW I need help with his care." (pt-stated)        CARE PLAN ENTRY (see longitudinal plan of care for additional care plan information)   Current Barriers:  Patient deceased, SW to close patient to embedded program    Case Manager Clinical Goal(s):   Over the next 90 days, patient will work with BSW to address needs related to Level of care concerns in patient with HTN, HLD, DMII, CKD Stage 2, Depression, and Dementia   Interventions:   Patient deceased, SW to close patient to embedded program   Patient Self Care Activities:   Patient's wife  verbalizes understanding of plan to work with BSW to address level of care concerns  Attends all scheduled provider appointments  Patient's wife calls provider office for new concerns or questions   Please see past updates related to this goal by clicking on the "Past Updates" button in the selected goal       .  COMPLETED: Per Caregiver/Spouse:  "I am overwhelemed by taking care of him and want to have a life again" (pt-stated)        Current Barriers:  . Patient deceased, SW to close patient to embedded program  Case Manager Clinical Goal(s):  [name] [name] Kitchen Over the next 90 days, patient will work with BSW to address needs related to  caregiver resources.   . Over the next 90 days, BSW will collaborate with RN Care Manager to address care management and care coordination needs  Interventions:  . Patient deceased, SW to close patient to embedded program  Patient Self Care Activities:  . Spouse/Caregiver provides 24/7 care for patient   Please see past updates related to this goal by clicking on the "Past Updates" button in the selected goal            [place],  [place] [phone]

## 2019-09-15 DIAGNOSIS — 419620001 Death: Secondary | SNOMED CT | POA: Diagnosis not present

## 2019-09-15 DEATH — deceased

## 2019-11-03 ENCOUNTER — Ambulatory Visit: Payer: PPO | Admitting: Podiatry

## 2019-11-15 NOTE — Addendum Note (Signed)
Addended by: Truddie Crumble on: 11/15/2019 02:24 PM   Modules accepted: Orders
